# Patient Record
Sex: Female | Born: 1950 | Race: White | Hispanic: No | Marital: Married | Smoking: Former smoker
Health system: Southern US, Community
[De-identification: ages and names within clinical notes are randomized; demographics above are authoritative.]

## PROBLEM LIST (undated history)

## (undated) DIAGNOSIS — Z973 Presence of spectacles and contact lenses: Secondary | ICD-10-CM

## (undated) DIAGNOSIS — M1990 Unspecified osteoarthritis, unspecified site: Secondary | ICD-10-CM

## (undated) DIAGNOSIS — G43909 Migraine, unspecified, not intractable, without status migrainosus: Secondary | ICD-10-CM

## (undated) DIAGNOSIS — R102 Pelvic and perineal pain: Secondary | ICD-10-CM

## (undated) DIAGNOSIS — Z808 Family history of malignant neoplasm of other organs or systems: Secondary | ICD-10-CM

## (undated) DIAGNOSIS — Z9889 Other specified postprocedural states: Secondary | ICD-10-CM

## (undated) DIAGNOSIS — R112 Nausea with vomiting, unspecified: Secondary | ICD-10-CM

## (undated) DIAGNOSIS — N736 Female pelvic peritoneal adhesions (postinfective): Secondary | ICD-10-CM

## (undated) DIAGNOSIS — E039 Hypothyroidism, unspecified: Secondary | ICD-10-CM

## (undated) DIAGNOSIS — Z8719 Personal history of other diseases of the digestive system: Secondary | ICD-10-CM

## (undated) DIAGNOSIS — C50912 Malignant neoplasm of unspecified site of left female breast: Secondary | ICD-10-CM

## (undated) DIAGNOSIS — Z803 Family history of malignant neoplasm of breast: Secondary | ICD-10-CM

## (undated) HISTORY — DX: Hypothyroidism, unspecified: E03.9

## (undated) HISTORY — DX: Family history of malignant neoplasm of other organs or systems: Z80.8

## (undated) HISTORY — PX: OTHER SURGICAL HISTORY: SHX169

## (undated) HISTORY — DX: Malignant neoplasm of unspecified site of left female breast: C50.912

## (undated) HISTORY — DX: Family history of malignant neoplasm of breast: Z80.3

---
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
## 1997-11-05 ENCOUNTER — Other Ambulatory Visit: Admission: RE | Admit: 1997-11-05 | Discharge: 1997-11-05 | Payer: Self-pay | Admitting: *Deleted

## 1998-11-10 ENCOUNTER — Other Ambulatory Visit: Admission: RE | Admit: 1998-11-10 | Discharge: 1998-11-10 | Payer: Self-pay | Admitting: *Deleted

## 2000-01-06 ENCOUNTER — Other Ambulatory Visit: Admission: RE | Admit: 2000-01-06 | Discharge: 2000-01-06 | Payer: Self-pay | Admitting: *Deleted

## 2001-01-09 ENCOUNTER — Other Ambulatory Visit: Admission: RE | Admit: 2001-01-09 | Discharge: 2001-01-09 | Payer: Self-pay | Admitting: *Deleted

## 2001-08-15 HISTORY — PX: LAPAROSCOPIC OVARIAN CYSTECTOMY: SUR786

## 2002-02-25 ENCOUNTER — Other Ambulatory Visit: Admission: RE | Admit: 2002-02-25 | Discharge: 2002-02-25 | Payer: Self-pay | Admitting: *Deleted

## 2002-08-15 HISTORY — PX: BREAST SURGERY: SHX581

## 2003-03-03 ENCOUNTER — Other Ambulatory Visit: Admission: RE | Admit: 2003-03-03 | Discharge: 2003-03-03 | Payer: Self-pay | Admitting: *Deleted

## 2003-06-23 ENCOUNTER — Other Ambulatory Visit: Admission: RE | Admit: 2003-06-23 | Discharge: 2003-06-23 | Payer: Self-pay | Admitting: General Surgery

## 2004-03-22 ENCOUNTER — Other Ambulatory Visit: Admission: RE | Admit: 2004-03-22 | Discharge: 2004-03-22 | Payer: Self-pay | Admitting: *Deleted

## 2004-08-15 HISTORY — PX: ABDOMINAL HYSTERECTOMY: SHX81

## 2005-03-23 ENCOUNTER — Other Ambulatory Visit: Admission: RE | Admit: 2005-03-23 | Discharge: 2005-03-23 | Payer: Self-pay | Admitting: *Deleted

## 2006-06-26 ENCOUNTER — Other Ambulatory Visit: Admission: RE | Admit: 2006-06-26 | Discharge: 2006-06-26 | Payer: Self-pay | Admitting: *Deleted

## 2007-06-26 ENCOUNTER — Other Ambulatory Visit: Admission: RE | Admit: 2007-06-26 | Discharge: 2007-06-26 | Payer: Self-pay | Admitting: *Deleted

## 2008-06-26 ENCOUNTER — Other Ambulatory Visit: Admission: RE | Admit: 2008-06-26 | Discharge: 2008-06-26 | Payer: Self-pay | Admitting: Gynecology

## 2010-08-15 HISTORY — PX: COLONOSCOPY: SHX174

## 2011-05-25 ENCOUNTER — Other Ambulatory Visit: Payer: Self-pay | Admitting: Radiology

## 2011-05-26 ENCOUNTER — Other Ambulatory Visit: Payer: Self-pay | Admitting: Radiology

## 2011-05-26 DIAGNOSIS — C50912 Malignant neoplasm of unspecified site of left female breast: Secondary | ICD-10-CM

## 2011-05-30 ENCOUNTER — Ambulatory Visit
Admission: RE | Admit: 2011-05-30 | Discharge: 2011-05-30 | Disposition: A | Discharge status: Home / Self Care | Payer: Commercial Managed Care - PPO | Source: Ambulatory Visit | Attending: Radiology | Admitting: Radiology

## 2011-05-30 DIAGNOSIS — C50912 Malignant neoplasm of unspecified site of left female breast: Secondary | ICD-10-CM

## 2011-05-30 MED ORDER — GADOBENATE DIMEGLUMINE 529 MG/ML IV SOLN
9.0000 mL | Freq: Once | INTRAVENOUS | Status: AC | PRN
  Administered 2011-05-30: 9 mL via INTRAVENOUS

## 2011-06-01 ENCOUNTER — Ambulatory Visit: Payer: Commercial Managed Care - PPO | Attending: General Surgery | Admitting: Physical Therapy

## 2011-06-01 ENCOUNTER — Other Ambulatory Visit: Payer: Self-pay | Admitting: Oncology

## 2011-06-01 ENCOUNTER — Encounter (INDEPENDENT_AMBULATORY_CARE_PROVIDER_SITE_OTHER): Payer: Self-pay | Admitting: General Surgery

## 2011-06-01 ENCOUNTER — Ambulatory Visit (HOSPITAL_BASED_OUTPATIENT_CLINIC_OR_DEPARTMENT_OTHER): Payer: Self-pay | Admitting: General Surgery

## 2011-06-01 ENCOUNTER — Encounter (HOSPITAL_BASED_OUTPATIENT_CLINIC_OR_DEPARTMENT_OTHER): Payer: Commercial Managed Care - PPO | Admitting: Oncology

## 2011-06-01 VITALS — BP 123/73 | HR 66 | Temp 97.4°F | Resp 20 | Ht 62.5 in | Wt 106.7 lb

## 2011-06-01 DIAGNOSIS — IMO0001 Reserved for inherently not codable concepts without codable children: Secondary | ICD-10-CM | POA: Insufficient documentation

## 2011-06-01 DIAGNOSIS — C50919 Malignant neoplasm of unspecified site of unspecified female breast: Secondary | ICD-10-CM | POA: Insufficient documentation

## 2011-06-01 DIAGNOSIS — C50119 Malignant neoplasm of central portion of unspecified female breast: Secondary | ICD-10-CM

## 2011-06-01 DIAGNOSIS — C50912 Malignant neoplasm of unspecified site of left female breast: Secondary | ICD-10-CM | POA: Insufficient documentation

## 2011-06-01 DIAGNOSIS — M25619 Stiffness of unspecified shoulder, not elsewhere classified: Secondary | ICD-10-CM | POA: Insufficient documentation

## 2011-06-01 DIAGNOSIS — C50419 Malignant neoplasm of upper-outer quadrant of unspecified female breast: Secondary | ICD-10-CM

## 2011-06-01 LAB — CBC WITH DIFFERENTIAL/PLATELET
Basophils Absolute: 0 10*3/uL (ref 0.0–0.1)
EOS%: 0.9 % (ref 0.0–7.0)
HCT: 40.5 % (ref 34.8–46.6)
HGB: 13.7 g/dL (ref 11.6–15.9)
LYMPH%: 28.2 % (ref 14.0–49.7)
MCH: 31.2 pg (ref 25.1–34.0)
MCV: 92.6 fL (ref 79.5–101.0)
NEUT%: 61.8 % (ref 38.4–76.8)
Platelets: 207 10*3/uL (ref 145–400)
lymph#: 1.3 10*3/uL (ref 0.9–3.3)

## 2011-06-01 LAB — COMPREHENSIVE METABOLIC PANEL
ALT: 15 U/L (ref 0–35)
AST: 16 U/L (ref 0–37)
Albumin: 4.4 g/dL (ref 3.5–5.2)
CO2: 29 mEq/L (ref 19–32)
Calcium: 10.1 mg/dL (ref 8.4–10.5)
Chloride: 103 mEq/L (ref 96–112)
Creatinine, Ser: 0.59 mg/dL (ref 0.50–1.10)
Potassium: 3.4 mEq/L — ABNORMAL LOW (ref 3.5–5.3)
Total Protein: 7 g/dL (ref 6.0–8.3)

## 2011-06-01 LAB — CANCER ANTIGEN 27.29: CA 27.29: 15 U/mL (ref 0–39)

## 2011-06-01 NOTE — Patient Instructions (Signed)
Plan for bilateral mastectomy and left sentinel node mapping 

## 2011-06-01 NOTE — Progress Notes (Signed)
Subjective:     Patient ID: Beth Davis, female   DOB: November 18, 1950, 60 y.o.   MRN: [id number]  HPI We are asked to see the patient in consultation by Dr. Anselmo Pickler to evaluate her for a left breast cancer. The patient is a 60 year old white female who recently went for mammogram andBSGI study. She has very dense breast tissue and as these studies are routine basis. She does not have any breast pain or discharge or nipple. TheBSGI study located a mass in the upper left breast. 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 By MRI the mass measured 8 mm. It was ER positive PR negative HER-2 negative. Her Ki-67 was 25%. She does have a family history of breast cancer in her mother also at the age of 72.  Review of Systems  Constitutional: Negative.   HENT: Negative.   Eyes: Negative.   Respiratory: Negative.   Cardiovascular: Negative.   Gastrointestinal: Negative.   Genitourinary: Negative.   Musculoskeletal: Negative.   Skin: Negative.   Neurological: Negative.   Hematological: Negative.   Psychiatric/Behavioral: Negative.    Past Medical History  Diagnosis Date  . Hypothyroidism   . Collagenous colitis   . Carpal tunnel syndrome of right wrist    Past Surgical History  Procedure Date  . Abdominal hysterectomy   . Laparoscopy abdomen diagnostic   . Orif femur fracture   . Orif humerus decompression   . Breast surgery 2004    excision left breast fibroadenoma   Current Outpatient Prescriptions  Medication Sig Dispense Refill  . budesonide (ENTOCORT EC) 3 MG 24 hr capsule Take 6 mg by mouth every morning.        . cholecalciferol (VITAMIN D) 1000 UNITS tablet Take 1,000 Units by mouth daily.        . frovatriptan (FROVA) 2.5 MG tablet Take 2.5 mg by mouth as needed. If recurs, may repeat after 2 hours. Max of 3 tabs in 24 hours.       Marland Kitchen levothyroxine (SYNTHROID, LEVOTHROID) 75 MCG tablet Take 75 mcg by mouth daily.        Marland Kitchen liothyronine (CYTOMEL) 5 MCG  tablet Take 5 mcg by mouth daily.         Allergies  Allergen Reactions  . Erythromycin Nausea Only  . Penicillins Hives       Objective:   Physical Exam  Constitutional: She is oriented to person, place, and time. She appears well-developed and well-nourished.  HENT:  Head: Normocephalic and atraumatic.  Eyes: Conjunctivae and EOM are normal. Pupils are equal, round, and reactive to light.  Neck: Normal range of motion. Neck supple.  Cardiovascular: Normal rate, regular rhythm and normal heart sounds.   Pulmonary/Chest: Effort normal and breath sounds normal.       She has no palpable mass in either breast. No axillary supraclavicular or cervical lymphadenopathy on either side.  Abdominal: Soft. Bowel sounds are normal.  Musculoskeletal: Normal range of motion.  Neurological: She is alert and oriented to person, place, and time.  Skin: Skin is warm and dry.  Psychiatric: She has a normal mood and affect. Her behavior is normal.       Assessment:     Small breast cancer in the upper left breast.    Plan:     I have discussed with her in detail the different surgical options for treatment and she has elected to have bilateral mastectomies. She understands that removing the unaffected breast  has no survival benefit. I have discussed with her in detail the risks and benefits operation and removed both breasts and perform sentinel node mapping and she understands and wishes to proceed. This includes the risk of skin flap necrosis. She will also have genetic testing done due to her family history.

## 2011-06-02 ENCOUNTER — Other Ambulatory Visit (INDEPENDENT_AMBULATORY_CARE_PROVIDER_SITE_OTHER): Payer: Self-pay | Admitting: General Surgery

## 2011-06-02 DIAGNOSIS — C50912 Malignant neoplasm of unspecified site of left female breast: Secondary | ICD-10-CM

## 2011-06-03 ENCOUNTER — Encounter: Payer: Commercial Managed Care - PPO | Admitting: Oncology

## 2011-06-07 ENCOUNTER — Encounter (HOSPITAL_COMMUNITY)
Admission: RE | Admit: 2011-06-07 | Discharge: 2011-06-07 | Disposition: A | Discharge status: Home / Self Care | Payer: Commercial Managed Care - PPO | Source: Ambulatory Visit | Attending: General Surgery | Admitting: General Surgery

## 2011-06-08 ENCOUNTER — Ambulatory Visit (INDEPENDENT_AMBULATORY_CARE_PROVIDER_SITE_OTHER): Payer: 59 | Admitting: Surgery

## 2011-06-08 ENCOUNTER — Encounter (INDEPENDENT_AMBULATORY_CARE_PROVIDER_SITE_OTHER): Payer: Self-pay | Admitting: Surgery

## 2011-06-08 VITALS — BP 118/78 | HR 80 | Temp 98.7°F | Resp 12 | Ht 62.5 in | Wt 105.8 lb

## 2011-06-08 DIAGNOSIS — C50919 Malignant neoplasm of unspecified site of unspecified female breast: Secondary | ICD-10-CM

## 2011-06-08 DIAGNOSIS — C50912 Malignant neoplasm of unspecified site of left female breast: Secondary | ICD-10-CM

## 2011-06-08 NOTE — Patient Instructions (Signed)
We will see you again at surgery. Call if you have any questions

## 2011-06-08 NOTE — Progress Notes (Signed)
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 She was returned seen by my associate, Dr. Carolynne Edouard, and was scheduled for bilateral mastectomies with left sentinel node evaluation. Unfortunately he has a family emergency and will be unable to do the surgery so she came to speak with need to be scheduled for surgery for me to do.  Details of her current history are noted in his note and not redictated here. 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  Exam:  Gen.: The patient is alert or healthy-appearing. Mood and affect are normal. Breasts: The breasts are symmetric in appearance. The biopsy site appears to be laterally in the left breast. There is no significant hematoma or ecchymosis. There is no palpable mass.  Lymphatics: There is no axillary or supraclavicular adenopathy.  Data reviewed: I have reviewed the prior office notes, pathology reports, mammogram and MRI and pathology findings.  Impression: Clinical stage I left breast cancer central.  Plan: She would like to have bilateral mastectomies. This was already discussed with Dr.Toth. She understands that a prophylactic mastectomy on the right side does not improve her chances for cure of recurrent cancer. She also understands that lumpectomy is a viable option. However she is insistent that she would prefer to have bilateral mastectomies. She does not wish reconstruction.  I discussed surgery with her including risks of bleeding need for drains knee for lesion overnight stay et Karie Soda. I also told her that if her sentinel lobes are positive we may need to do a completion axillary node dissection.  I think all questions have been answered. She is scheduled for October 30 four bilateral [facility].

## 2011-06-09 ENCOUNTER — Other Ambulatory Visit (INDEPENDENT_AMBULATORY_CARE_PROVIDER_SITE_OTHER): Payer: Self-pay | Admitting: Surgery

## 2011-06-09 ENCOUNTER — Encounter (HOSPITAL_BASED_OUTPATIENT_CLINIC_OR_DEPARTMENT_OTHER)
Admission: RE | Admit: 2011-06-09 | Discharge: 2011-06-09 | Disposition: A | Discharge status: Home / Self Care | Payer: Commercial Managed Care - PPO | Source: Ambulatory Visit | Attending: Surgery | Admitting: Surgery

## 2011-06-09 ENCOUNTER — Ambulatory Visit
Admission: RE | Admit: 2011-06-09 | Discharge: 2011-06-09 | Disposition: A | Discharge status: Home / Self Care | Payer: 59 | Source: Ambulatory Visit | Attending: Surgery | Admitting: Surgery

## 2011-06-09 DIAGNOSIS — Z01811 Encounter for preprocedural respiratory examination: Secondary | ICD-10-CM

## 2011-06-09 LAB — BASIC METABOLIC PANEL
BUN: 7 mg/dL (ref 6–23)
Calcium: 10.1 mg/dL (ref 8.4–10.5)
Chloride: 105 mEq/L (ref 96–112)
Creatinine, Ser: 0.58 mg/dL (ref 0.50–1.10)
GFR calc Af Amer: >90 mL/min (ref >90)
GFR calc non Af Amer: >90 mL/min (ref >90)

## 2011-06-10 ENCOUNTER — Other Ambulatory Visit (HOSPITAL_COMMUNITY): Payer: Commercial Managed Care - PPO

## 2011-06-14 ENCOUNTER — Other Ambulatory Visit (HOSPITAL_COMMUNITY): Payer: Commercial Managed Care - PPO

## 2011-06-14 ENCOUNTER — Ambulatory Visit (HOSPITAL_COMMUNITY)
Admission: RE | Admit: 2011-06-14 | Discharge: 2011-06-14 | Disposition: A | Discharge status: Home / Self Care | Payer: Commercial Managed Care - PPO | Source: Ambulatory Visit | Attending: General Surgery | Admitting: General Surgery

## 2011-06-14 ENCOUNTER — Encounter (INDEPENDENT_AMBULATORY_CARE_PROVIDER_SITE_OTHER): Payer: Self-pay | Admitting: Surgery

## 2011-06-14 ENCOUNTER — Ambulatory Visit (HOSPITAL_BASED_OUTPATIENT_CLINIC_OR_DEPARTMENT_OTHER)
Admission: RE | Admit: 2011-06-14 | Discharge: 2011-06-15 | Disposition: A | Discharge status: Home / Self Care | Payer: Commercial Managed Care - PPO | Source: Ambulatory Visit | Attending: Surgery | Admitting: Surgery

## 2011-06-14 ENCOUNTER — Other Ambulatory Visit (INDEPENDENT_AMBULATORY_CARE_PROVIDER_SITE_OTHER): Payer: Self-pay | Admitting: Surgery

## 2011-06-14 DIAGNOSIS — C50912 Malignant neoplasm of unspecified site of left female breast: Secondary | ICD-10-CM

## 2011-06-14 DIAGNOSIS — C50419 Malignant neoplasm of upper-outer quadrant of unspecified female breast: Secondary | ICD-10-CM | POA: Insufficient documentation

## 2011-06-14 DIAGNOSIS — Z01818 Encounter for other preprocedural examination: Secondary | ICD-10-CM | POA: Insufficient documentation

## 2011-06-14 DIAGNOSIS — G43909 Migraine, unspecified, not intractable, without status migrainosus: Secondary | ICD-10-CM | POA: Insufficient documentation

## 2011-06-14 DIAGNOSIS — E079 Disorder of thyroid, unspecified: Secondary | ICD-10-CM | POA: Insufficient documentation

## 2011-06-14 DIAGNOSIS — N6019 Diffuse cystic mastopathy of unspecified breast: Secondary | ICD-10-CM

## 2011-06-14 DIAGNOSIS — C50919 Malignant neoplasm of unspecified site of unspecified female breast: Secondary | ICD-10-CM

## 2011-06-14 DIAGNOSIS — Z4001 Encounter for prophylactic removal of breast: Secondary | ICD-10-CM | POA: Insufficient documentation

## 2011-06-14 DIAGNOSIS — Z01812 Encounter for preprocedural laboratory examination: Secondary | ICD-10-CM | POA: Insufficient documentation

## 2011-06-14 DIAGNOSIS — D0590 Unspecified type of carcinoma in situ of unspecified breast: Secondary | ICD-10-CM | POA: Insufficient documentation

## 2011-06-14 DIAGNOSIS — Z01810 Encounter for preprocedural cardiovascular examination: Secondary | ICD-10-CM | POA: Insufficient documentation

## 2011-06-14 HISTORY — PX: OTHER SURGICAL HISTORY: SHX169

## 2011-06-14 MED ORDER — TECHNETIUM TC 99M SULFUR COLLOID FILTERED
1.0000 | Freq: Once | INTRAVENOUS | Status: AC | PRN
  Administered 2011-06-14: 1 via INTRADERMAL

## 2011-06-15 LAB — POCT HEMOGLOBIN-HEMACUE: Hemoglobin: 14.1 g/dL (ref 12.0–15.0)

## 2011-06-15 NOTE — Op Note (Signed)
NAME:  Beth Davis, Beth Davis NO.:  [id number]  MEDICAL RECORD NO.:  [id number]  LOCATION:  [facility]                          FACILITY:  [facility]  PHYSICIAN:  Currie Paris, M.D.DATE OF BIRTH:  04-10-1951  DATE OF PROCEDURE: DATE OF DISCHARGE:                              OPERATIVE REPORT   PREOPERATIVE DIAGNOSIS:  Carcinoma, left breast upper outer quadrant, clinical stage I.  POSTOPERATIVE DIAGNOSIS:  Carcinoma, left breast upper outer quadrant, clinical stage I.  PROCEDURE:  Left total mastectomy with blue dye injection and sentinel lymph node biopsy (4 lymph nodes), right prophylactic total mastectomy.  SURGEON:  Currie Paris, MD  ASSISTANT:  Jaclynn Major, PA student.  ANESTHESIA:  General.  CLINICAL HISTORY:  This patient is a 60 year old lady, recently diagnosed with a small left breast cancer.  She has a family history of breast cancer in mother.  After lengthy discussion with the patient, sheelected to proceed to a mastectomy with sentinel node evaluation of her breast cancer and she strongly desired a right prophylactic mastectomy. She did not wish any reconstruction at this point in time, although was offered for her to have a Plastic Surgery consultation.  DESCRIPTION OF PROCEDURE:  I saw the patient in the holding area.  She had no further questions.  I identified the left breast as the side for the sentinel node and marked that as did the patient.  The patient was taken to the operating room.  After satisfactory general anesthesia had been obtained, a time-out was done.  The left breast was injected with 5 mL of subareolar methylene blue and massaged in.  Both breasts were prepped and draped as a sterile field.  I started on the right side, made somewhat oblique elliptical incision and raised the usual skin flaps to sternum, clavicle inframammary fold, and latissimus and removed the breast from medial to lateral trying to leave the fascia  behind.  I disconnected the breast at the lateral aspect of the pectoralis and took the superficial tissue between there and the skin, but did not enter the axilla proper.  I spent several minutes irrigating and making sure everything was dry.  I put a 19 Blake drain in.  I closed initially with 3-0 Vicryl, but left the skin unclosed for while, I approached the left side.  The left breast was started and I marked the axilla for hot area that was identified with the Neoprobe.  I then made the superior skin flap raised to the clavicle, sternum, and out into the axilla.  I saw right 3 lymphatic and there was a cluster what appeared to be 3-4 lymph nodes and all had counts from 300 up to 3000.  The 4 were removed, 2 of them were bright blue.  These were sent for touch preps.  I made sure everything was dry.  I then made the inferior flap somewhat to the right side.  I removed the breast taking the fascia.  When I got to the clavipectoral fascia, I waited for a while for pathology to call back with the final path report on the sentinel nodes.  Meantime, the right breast skin was closed with some 4-0 Monocryl running and Dermabond. There had been no  bleeding collecting in the drain while we had been working on the left side.  Pathology called that the 4 lymph nodes were all negative.  I completed the mastectomy by removing the lateral attachments of the breast.  We spent several minutes irrigating making sure everything was dry.  Here bleeders were either coagulated or suture ligated or tied with Vicryl. Once everything was dry, we closed with 3-0 Vicryl, 4-0 Monocryl running, Dermabond, and Steri-Strips.  The patient tolerated the procedure well.  There were no complications. All counts were correct.     Currie Paris, M.D.     CJS/MEDQ  D:  06/14/2011  T:  06/14/2011  Job:  [id number]  Electronically Signed by Cyndia Bent M.D. on 06/15/2011 11:45:48 AM

## 2011-06-16 ENCOUNTER — Telehealth (INDEPENDENT_AMBULATORY_CARE_PROVIDER_SITE_OTHER): Payer: Self-pay | Admitting: General Surgery

## 2011-06-16 NOTE — Telephone Encounter (Signed)
Patient called in to ask about arm exercises status post bilateral mastectomies. I informed her she should not start exercises until her drains are removed. She asked about elevating her arms and I stating this wasn't necessary but to call if has any swelling, redness, or any other post op problems.

## 2011-06-17 ENCOUNTER — Telehealth (INDEPENDENT_AMBULATORY_CARE_PROVIDER_SITE_OTHER): Payer: Self-pay

## 2011-06-17 ENCOUNTER — Telehealth (INDEPENDENT_AMBULATORY_CARE_PROVIDER_SITE_OTHER): Payer: Self-pay | Admitting: General Surgery

## 2011-06-17 NOTE — Telephone Encounter (Signed)
Pt called stating she was having slight nausea with pain meds. No vomiting and minimal discomfort. Pt states she does not really need the pain med. Pt to d/c pain med and take one dose of benadryl to see if this resolves nausea. Pt to call if nausea continues or other symptoms occur.

## 2011-06-17 NOTE — Telephone Encounter (Signed)
Message copied by Liliana Cline on Fri Jun 17, 2011  1:39 PM ------      Message from: Currie Paris      Created: Thu Jun 16, 2011  7:02 PM       Tell the patient that her margins are OK and her lymph nodes are negative. I will discuss in detail in the office.

## 2011-06-17 NOTE — Telephone Encounter (Signed)
Patient aware of pathology results.

## 2011-06-20 ENCOUNTER — Ambulatory Visit (HOSPITAL_COMMUNITY)
Admission: RE | Admit: 2011-06-20 | Payer: Commercial Managed Care - PPO | Source: Ambulatory Visit | Admitting: General Surgery

## 2011-06-20 ENCOUNTER — Ambulatory Visit (INDEPENDENT_AMBULATORY_CARE_PROVIDER_SITE_OTHER): Payer: Commercial Managed Care - PPO | Admitting: Surgery

## 2011-06-20 ENCOUNTER — Encounter (INDEPENDENT_AMBULATORY_CARE_PROVIDER_SITE_OTHER): Payer: Self-pay | Admitting: Surgery

## 2011-06-20 ENCOUNTER — Other Ambulatory Visit (HOSPITAL_COMMUNITY): Payer: Commercial Managed Care - PPO

## 2011-06-20 ENCOUNTER — Encounter (HOSPITAL_COMMUNITY): Admission: RE | Payer: Self-pay | Source: Ambulatory Visit

## 2011-06-20 VITALS — Temp 98.4°F

## 2011-06-20 DIAGNOSIS — C50912 Malignant neoplasm of unspecified site of left female breast: Secondary | ICD-10-CM

## 2011-06-20 DIAGNOSIS — C50919 Malignant neoplasm of unspecified site of unspecified female breast: Secondary | ICD-10-CM

## 2011-06-20 SURGERY — MASTECTOMY WITH SENTINEL LYMPH NODE BIOPSY
Anesthesia: General | Laterality: Bilateral

## 2011-06-20 NOTE — Progress Notes (Signed)
Beth Davis    [id number] 06/20/2011    [birth year]   CC: Post op Bilateral mastectomy   HPI: The patient returns for post op follow-up. She underwent a right total and left total with SLN on 06/14/2011. Over all she feels that she is doing well. Minimal pain and gaining strength  PE: The incision is healing nicely and there is no evidence of infection or hematoma.  The drains are slowing and the right is  , 30 cc/day for two days.  DATA REVIEWED: Pathology report showed IDC, neg SLN  IMPRESSION: Patient doing well. Can pull Right drain  PLAN: Her next visit will be in four days. Right drain removed without difficulty.Marland Kitchen

## 2011-06-20 NOTE — Patient Instructions (Signed)
I will see you back Friday to see if we can get the second drain out.

## 2011-06-22 ENCOUNTER — Ambulatory Visit (INDEPENDENT_AMBULATORY_CARE_PROVIDER_SITE_OTHER): Payer: Commercial Managed Care - PPO | Admitting: General Surgery

## 2011-06-22 DIAGNOSIS — IMO0001 Reserved for inherently not codable concepts without codable children: Secondary | ICD-10-CM

## 2011-06-22 DIAGNOSIS — Z4800 Encounter for change or removal of nonsurgical wound dressing: Secondary | ICD-10-CM

## 2011-06-22 NOTE — Progress Notes (Signed)
Patient comes in status post bilateral mastectomies with a drain still present on her left side. Drain has been under 30 cc for two days and patient is ready to have this removed. Her only complaint is of some tightness at her incisions. She states very uncomfortable. Her wounds look good. Drainage in drain looks good. I removed drain with no problems and covered drain site with gauze and triple antibiotic ointment. Patient advised to keep covered for 24 hours and then just cover area if still having drainage. Patient advised she can shower but not to submerge wounds in a bath yet. She will follow up with Dr Jamey Ripa on Friday 06/24/11 and call with any problems prior.

## 2011-06-22 NOTE — Patient Instructions (Signed)
Keep your appt with Dr Jamey Ripa. Call if you have any fevers, redness, swelling, prior to appt.

## 2011-06-24 ENCOUNTER — Ambulatory Visit (INDEPENDENT_AMBULATORY_CARE_PROVIDER_SITE_OTHER): Payer: Commercial Managed Care - PPO | Admitting: Surgery

## 2011-06-24 ENCOUNTER — Encounter (INDEPENDENT_AMBULATORY_CARE_PROVIDER_SITE_OTHER): Payer: Self-pay | Admitting: Surgery

## 2011-06-24 VITALS — BP 122/72 | HR 60 | Temp 98.3°F | Resp 16 | Ht 62.5 in | Wt 103.0 lb

## 2011-06-24 DIAGNOSIS — Z853 Personal history of malignant neoplasm of breast: Secondary | ICD-10-CM

## 2011-06-24 DIAGNOSIS — Z09 Encounter for follow-up examination after completed treatment for conditions other than malignant neoplasm: Secondary | ICD-10-CM

## 2011-06-24 NOTE — Progress Notes (Signed)
Beth Davis    [id number] 06/24/2011    1951/03/24   CC: Post op Bilateral mastectomy   HPI: The patient returns for post op follow-up. She underwent a right total and left total with SLN on 06/14/2011. Over all she feels that she is doing well. Minimal pain and gaining strength  PE: The incision is healing nicely and there is no evidence of infection or hematoma.  The drains are out .    IMPRESSION: Patient doing well.  PLAN: RTC one month. Begin PT for shoulders/arms .Marland Kitchen

## 2011-06-24 NOTE — Patient Instructions (Signed)
Begin some physical therapy exercises. I will see you in a month, sooner if you think there is any fluid collecting

## 2011-06-27 ENCOUNTER — Encounter: Payer: Self-pay | Admitting: *Deleted

## 2011-06-27 ENCOUNTER — Ambulatory Visit: Payer: Commercial Managed Care - PPO | Attending: General Surgery | Admitting: Physical Therapy

## 2011-06-27 DIAGNOSIS — C50919 Malignant neoplasm of unspecified site of unspecified female breast: Secondary | ICD-10-CM | POA: Insufficient documentation

## 2011-06-27 DIAGNOSIS — M25619 Stiffness of unspecified shoulder, not elsewhere classified: Secondary | ICD-10-CM | POA: Insufficient documentation

## 2011-06-27 DIAGNOSIS — IMO0001 Reserved for inherently not codable concepts without codable children: Secondary | ICD-10-CM | POA: Insufficient documentation

## 2011-06-27 NOTE — Progress Notes (Signed)
Mailed after appt letter to pt. 

## 2011-06-29 ENCOUNTER — Ambulatory Visit: Payer: Commercial Managed Care - PPO | Admitting: Physical Therapy

## 2011-07-04 ENCOUNTER — Encounter: Payer: Commercial Managed Care - PPO | Admitting: Physical Therapy

## 2011-07-05 ENCOUNTER — Ambulatory Visit: Payer: Commercial Managed Care - PPO | Admitting: Physical Therapy

## 2011-07-05 ENCOUNTER — Encounter: Payer: Self-pay | Admitting: *Deleted

## 2011-07-05 NOTE — Progress Notes (Signed)
Received Oncotype Dx results of 29.  Gave copy to MD.

## 2011-07-06 ENCOUNTER — Telehealth (INDEPENDENT_AMBULATORY_CARE_PROVIDER_SITE_OTHER): Payer: Self-pay | Admitting: General Surgery

## 2011-07-06 NOTE — Telephone Encounter (Signed)
Patient left voicemail stating she is doing well accept having some "weird nerve pains." Stating she "can't stand for anything to touch her" and compression is not helping. She is calling to see if we ever prescribe gabapentin for this. I called her back. Made her aware we do not usually do this because it usually gets better on its own but made her aware I would ask Dr Jamey Ripa. Please advise.

## 2011-07-11 ENCOUNTER — Ambulatory Visit: Payer: Commercial Managed Care - PPO | Admitting: Physical Therapy

## 2011-07-11 NOTE — Telephone Encounter (Signed)
Patient called back today stating her nerve pain has gotten better over the weekend and she does not need a medication to help with that. Also mentioned that she spoke with [facility] through physical therapy and wants to start therapy to help with her adhesions. They will send a fax over for Dr Jamey Ripa to sign.

## 2011-07-13 ENCOUNTER — Encounter: Payer: Commercial Managed Care - PPO | Admitting: Physical Therapy

## 2011-07-18 ENCOUNTER — Encounter: Payer: Commercial Managed Care - PPO | Admitting: Physical Therapy

## 2011-07-18 ENCOUNTER — Telehealth: Payer: Self-pay | Admitting: *Deleted

## 2011-07-18 ENCOUNTER — Telehealth: Payer: Self-pay | Admitting: Oncology

## 2011-07-18 ENCOUNTER — Telehealth (INDEPENDENT_AMBULATORY_CARE_PROVIDER_SITE_OTHER): Payer: Self-pay | Admitting: Surgery

## 2011-07-18 ENCOUNTER — Ambulatory Visit (HOSPITAL_BASED_OUTPATIENT_CLINIC_OR_DEPARTMENT_OTHER): Payer: Commercial Managed Care - PPO | Admitting: Oncology

## 2011-07-18 VITALS — BP 122/78 | HR 76 | Temp 98.5°F | Ht 62.5 in | Wt 105.3 lb

## 2011-07-18 DIAGNOSIS — Z170 Estrogen receptor positive status [ER+]: Secondary | ICD-10-CM

## 2011-07-18 DIAGNOSIS — C50919 Malignant neoplasm of unspecified site of unspecified female breast: Secondary | ICD-10-CM

## 2011-07-18 DIAGNOSIS — Z9010 Acquired absence of unspecified breast and nipple: Secondary | ICD-10-CM

## 2011-07-18 DIAGNOSIS — C50419 Malignant neoplasm of upper-outer quadrant of unspecified female breast: Secondary | ICD-10-CM

## 2011-07-18 NOTE — Progress Notes (Signed)
ID: [name]   Interval History:  Beth Davis returns today with her husband Beth Davis for followup of her breast cancer. Since her last visit here she underwent bilateral mastectomies with left axillary lymph node sampling. The final pathology ([id number]) showed the right mastectomy to be benign, with 0 of 4 lymph nodes sampled. On the left side in addition to ductal carcinoma in situ, there was an 8 mm invasive ductal carcinoma, grade 2, as well as other smaller foci of invasive tumor in different quadrants. All 7 left axillary lymph nodes were clear. ATR 2 was repeated and again was found to be not amplified.  ROS:  She tolerated the surgery well, without unusual pain, bleeding, dehiscence, rash, or swelling. Detailed review of systems today was otherwise noncontributory.  Medications: I have reviewed the patient's current medications.  Current Outpatient Prescriptions  Medication Sig Dispense Refill  . budesonide (ENTOCORT EC) 3 MG 24 hr capsule Take 6 mg by mouth every morning.        . cholecalciferol (VITAMIN D) 1000 UNITS tablet Take 1,000 Units by mouth daily.        . frovatriptan (FROVA) 2.5 MG tablet Take 2.5 mg by mouth as needed. If recurs, may repeat after 2 hours. Max of 3 tabs in 24 hours.       Marland Kitchen levothyroxine (SYNTHROID, LEVOTHROID) 125 MCG tablet Take 125 mcg by mouth daily. Takes half of the pill so only 62.5mg        . liothyronine (CYTOMEL) 5 MCG tablet Take 5 mcg by mouth daily.         Family history: The patient's mother is alive at age 59. She had breast cancer diagnosed at the age of 51. The patient has 3 brothers no sisters. The patient's father died at the age of 41. A cousin on her father's side had breast cancer at the age of 81.  Gynecologic history: She had menarche age 60, simple hysterectomy in 2006. She is GX P2, first pregnancy to term age 60. She used birth control pills between 1972 and 1974. She took Clomid for fertility between 1983 and 1986.  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 Her husband Christiane Davis (goes by Beth Davis) is a Clinical research associate here in town. Daughter Beth Davis 26 lives in [place]. She works there as a Education officer, community. Dr. [name], 25, is a Gaffer and [place] in nutrition and diet headaches.   Objective:  Filed Vitals:   07/18/11 1021  BP: 122/78  Pulse: 76  Temp: 98.5 F (36.9 C)     Physical Exam:    Sclerae unicteric  Oropharynx clear  No peripheral adenopathy  Lungs clear -- no rales or rhonchi  Heart regular rate and rhythm  Abdomen benign  MSK no focal spinal tenderness, no peripheral edema  Neuro nonfocal  Breast exam: Status post bilateral mastectomies; in no dehiscence, swelling, erythema, or unusual tenderness.  Lab Results: baseline CA 27-29 was noninformative at 15  CMP      Component Value Date/Time   NA 142 06/09/2011 1240   K 5.1 06/09/2011 1240   CL 105 06/09/2011 1240   CO2 29 06/09/2011 1240   GLUCOSE 93 06/09/2011 1240   BUN 7 06/09/2011 1240   CREATININE 0.58 06/09/2011 1240   CALCIUM 10.1 06/09/2011 1240   PROT 7.0 06/01/2011 1218   PROT 7.0 06/01/2011 1218   ALBUMIN 4.4  06/01/2011 1218   ALBUMIN 4.4 06/01/2011 1218   AST 16 06/01/2011 1218   AST 16 06/01/2011 1218   ALT 15 06/01/2011 1218   ALT 15 06/01/2011 1218   ALKPHOS 95 06/01/2011 1218   ALKPHOS 95 06/01/2011 1218   BILITOT 0.4 06/01/2011 1218   BILITOT 0.4 06/01/2011 1218   GFRNONAA >90 06/09/2011 1240   GFRAA >90 06/09/2011 1240    CBC Lab Results  Component Value Date   WBC 4.8 06/01/2011   HGB 14.1 06/14/2011   HCT 40.5 06/01/2011   MCV 92.6 06/01/2011   PLT 207 06/01/2011    Studies/Results:  No new lts found.  Assessment: 60 year old [place] woman status post bilateral mastectomies October of 2012 for a left sided invasive ductal carcinoma, T1b N0 or Stage I, estrogen receptor 95%  positive, progesterone receptor negative, with no HER-2 amplification and an MIB-1 of59%. Her Oncotype DX falls in the intermediate range, and predicts a 19% risk of recurrence with 5 years of tamoxifen.   Plan: the Adjuvant! Program I would quote her a risk of death from this cancer with no further treatment and a 3% range, and a risk of recurrence after 5 years of anti-estrogens in the 9% range or so. This is of course significantly different from the Oncotype prognosis and we spent the better part of today's hour-long visit discussing this in detail.  Reckoning that chemotherapy cuts the risk of recurrence usually by about a third this means addition of chemotherapy would reduce her risk somewhere between 3 and 6%, absolute value, depending on which database we rely on/  I would favor ongoing with the Oncotype prognosis. The chief reason for this is the tumor grade, the fact that the tumor is progesterone receptor negative, and the moderately elevated MIB-1. This is not the usual low-grade estrogen and progesterone receptor negative tumor.  Accordingly I recommended chemotherapy and Beth Davis was very much in favor of this since she "wants to do everything possible" to keep this cancer from recurring. She will come to chemotherapy school in the near future, but today we discussed toxicities side effects and complications of treatment and fair detail. She is going to return to see me December 14. At that point we will discuss anti-emetics and other supportive care and I will write her the appropriate scripts. The plan will be to start cyclophosphamide and docetaxel December 17 and continue that on a every 3 week basis for a total of 4 cycles. Beth Davis C 07/18/2011

## 2011-07-18 NOTE — Telephone Encounter (Signed)
gave patient appointment for chemo class and gave patient appointment for 07-29-2011 at 10:00am called left voice message to inform dr.streck patient needs port a cath placed

## 2011-07-18 NOTE — Telephone Encounter (Signed)
pt called and aked if she has her first tx on 12/17.  appt was not scheduled,  scheduled pending tx orders for 12/17 and inj for 12/18

## 2011-07-19 ENCOUNTER — Other Ambulatory Visit (INDEPENDENT_AMBULATORY_CARE_PROVIDER_SITE_OTHER): Payer: Self-pay | Admitting: Surgery

## 2011-07-19 NOTE — Telephone Encounter (Signed)
Orders written and placed in EPIC. Taken to scheduling.

## 2011-07-20 ENCOUNTER — Encounter: Payer: Self-pay | Admitting: *Deleted

## 2011-07-20 ENCOUNTER — Other Ambulatory Visit: Payer: Commercial Managed Care - PPO

## 2011-07-20 ENCOUNTER — Encounter: Payer: Commercial Managed Care - PPO | Admitting: Physical Therapy

## 2011-07-21 ENCOUNTER — Encounter (HOSPITAL_BASED_OUTPATIENT_CLINIC_OR_DEPARTMENT_OTHER): Payer: Self-pay | Admitting: *Deleted

## 2011-07-21 NOTE — Progress Notes (Signed)
No iv-bp in lt arm No labs needed

## 2011-07-22 ENCOUNTER — Encounter (INDEPENDENT_AMBULATORY_CARE_PROVIDER_SITE_OTHER): Payer: Self-pay | Admitting: Surgery

## 2011-07-22 ENCOUNTER — Ambulatory Visit (INDEPENDENT_AMBULATORY_CARE_PROVIDER_SITE_OTHER): Payer: Commercial Managed Care - PPO | Admitting: Surgery

## 2011-07-22 VITALS — BP 124/82 | HR 60 | Temp 97.6°F | Resp 16 | Ht 62.5 in | Wt 103.0 lb

## 2011-07-22 DIAGNOSIS — Z9889 Other specified postprocedural states: Secondary | ICD-10-CM

## 2011-07-22 NOTE — Progress Notes (Signed)
Beth Davis    [id number] 07/22/2011    1951-03-23   CC: Post op Bilateral mastectomy   HPI: The patient returns for post op follow-up. She underwent a right total and left total with SLN on 06/14/2011. Over all she feels that she is doing well. Minimal pain and gaining strength She is going to do chemo and thus needs a port PE: The incision is healing nicely and there is no evidence of infection or hematoma.      IMPRESSION: Patient doing well.  PLAN: She is scheduled for a port placement on 12/11 ibuprofen have discussed risks and complications including bleeding, infection and ptx .Marland Kitchen

## 2011-07-22 NOTE — Patient Instructions (Signed)
I will see you Tuesday at surgery - call if any questions

## 2011-07-25 ENCOUNTER — Encounter: Payer: Commercial Managed Care - PPO | Admitting: Physical Therapy

## 2011-07-26 ENCOUNTER — Encounter (HOSPITAL_BASED_OUTPATIENT_CLINIC_OR_DEPARTMENT_OTHER)
Admission: RE | Disposition: A | Discharge status: Home / Self Care | Payer: Self-pay | Source: Ambulatory Visit | Attending: Surgery

## 2011-07-26 ENCOUNTER — Ambulatory Visit (HOSPITAL_BASED_OUTPATIENT_CLINIC_OR_DEPARTMENT_OTHER)
Admission: RE | Admit: 2011-07-26 | Discharge: 2011-07-26 | Disposition: A | Discharge status: Home / Self Care | Payer: Commercial Managed Care - PPO | Source: Ambulatory Visit | Attending: Surgery | Admitting: Surgery

## 2011-07-26 ENCOUNTER — Encounter (HOSPITAL_BASED_OUTPATIENT_CLINIC_OR_DEPARTMENT_OTHER): Payer: Self-pay | Admitting: *Deleted

## 2011-07-26 ENCOUNTER — Ambulatory Visit (HOSPITAL_COMMUNITY): Payer: Commercial Managed Care - PPO

## 2011-07-26 ENCOUNTER — Encounter (HOSPITAL_BASED_OUTPATIENT_CLINIC_OR_DEPARTMENT_OTHER): Payer: Self-pay | Admitting: Anesthesiology

## 2011-07-26 ENCOUNTER — Ambulatory Visit (HOSPITAL_BASED_OUTPATIENT_CLINIC_OR_DEPARTMENT_OTHER): Payer: Commercial Managed Care - PPO | Admitting: Anesthesiology

## 2011-07-26 DIAGNOSIS — C50912 Malignant neoplasm of unspecified site of left female breast: Secondary | ICD-10-CM

## 2011-07-26 DIAGNOSIS — C50919 Malignant neoplasm of unspecified site of unspecified female breast: Secondary | ICD-10-CM | POA: Insufficient documentation

## 2011-07-26 DIAGNOSIS — Z01812 Encounter for preprocedural laboratory examination: Secondary | ICD-10-CM | POA: Insufficient documentation

## 2011-07-26 HISTORY — PX: PORTACATH PLACEMENT: SHX2246

## 2011-07-26 LAB — POCT HEMOGLOBIN-HEMACUE: Hemoglobin: 11 g/dL — ABNORMAL LOW (ref 12.0–15.0)

## 2011-07-26 SURGERY — INSERTION, TUNNELED CENTRAL VENOUS DEVICE, WITH PORT
Anesthesia: Choice | Site: Breast

## 2011-07-26 MED ORDER — PROPOFOL 10 MG/ML IV EMUL
INTRAVENOUS | Status: DC | PRN
  Administered 2011-07-26 (×3): 20 mg via INTRAVENOUS
  Administered 2011-07-26: 10 mg via INTRAVENOUS

## 2011-07-26 MED ORDER — FENTANYL CITRATE 0.05 MG/ML IJ SOLN
INTRAMUSCULAR | Status: DC | PRN
  Administered 2011-07-26: 100 ug via INTRAVENOUS

## 2011-07-26 MED ORDER — FENTANYL CITRATE 0.05 MG/ML IJ SOLN: 50.0000 ug | INTRAMUSCULAR | Status: DC | PRN

## 2011-07-26 MED ORDER — METOCLOPRAMIDE HCL 5 MG/ML IJ SOLN: 10.0000 mg | Freq: Once | INTRAMUSCULAR | Status: DC | PRN

## 2011-07-26 MED ORDER — HEPARIN (PORCINE) IN NACL 2-0.9 UNIT/ML-% IJ SOLN
INTRAMUSCULAR | Status: DC | PRN
  Administered 2011-07-26: 1 via INTRAVENOUS

## 2011-07-26 MED ORDER — ONDANSETRON HCL 4 MG/2ML IJ SOLN
INTRAMUSCULAR | Status: DC | PRN
  Administered 2011-07-26: 4 mg via INTRAVENOUS

## 2011-07-26 MED ORDER — OXYCODONE-ACETAMINOPHEN 5-325 MG PO TABS
1.0000 | ORAL_TABLET | Freq: Once | ORAL | Status: AC | PRN
  Administered 2011-07-26: 1 via ORAL

## 2011-07-26 MED ORDER — OXYCODONE-ACETAMINOPHEN 5-325 MG PO TABS: 1.0000 | ORAL_TABLET | ORAL | Status: AC | PRN

## 2011-07-26 MED ORDER — LACTATED RINGERS IV SOLN
INTRAVENOUS | Status: DC
  Administered 2011-07-26: 07:00:00 via INTRAVENOUS

## 2011-07-26 MED ORDER — CIPROFLOXACIN IN D5W 400 MG/200ML IV SOLN
400.0000 mg | INTRAVENOUS | Status: AC
  Administered 2011-07-26 (×2): 400 mg via INTRAVENOUS

## 2011-07-26 MED ORDER — MIDAZOLAM HCL 5 MG/5ML IJ SOLN
INTRAMUSCULAR | Status: DC | PRN
  Administered 2011-07-26: 2 mg via INTRAVENOUS

## 2011-07-26 MED ORDER — HEPARIN SOD (PORK) LOCK FLUSH 100 UNIT/ML IV SOLN
INTRAVENOUS | Status: DC | PRN
  Administered 2011-07-26: 4 [IU] via INTRAVENOUS

## 2011-07-26 MED ORDER — LIDOCAINE HCL (PF) 1 % IJ SOLN
INTRAMUSCULAR | Status: DC | PRN
  Administered 2011-07-26: 12 mL

## 2011-07-26 MED ORDER — FENTANYL CITRATE 0.05 MG/ML IJ SOLN: 25.0000 ug | INTRAMUSCULAR | Status: DC | PRN

## 2011-07-26 MED ORDER — MIDAZOLAM HCL 2 MG/2ML IJ SOLN: 0.5000 mg | INTRAMUSCULAR | Status: DC | PRN

## 2011-07-26 SURGICAL SUPPLY — 49 items
ADH SKN CLS APL DERMABOND .7 (GAUZE/BANDAGES/DRESSINGS) ×1
APL SKNCLS STERI-STRIP NONHPOA (GAUZE/BANDAGES/DRESSINGS)
BAG DECANTER FOR FLEXI CONT (MISCELLANEOUS) ×2 IMPLANT
BENZOIN TINCTURE PRP APPL 2/3 (GAUZE/BANDAGES/DRESSINGS) IMPLANT
BLADE HEX COATED 2.75 (ELECTRODE) ×2 IMPLANT
BLADE SURG 15 STRL LF DISP TIS (BLADE) ×1 IMPLANT
BLADE SURG 15 STRL SS (BLADE) ×2
CANISTER SUCTION 1200CC (MISCELLANEOUS) IMPLANT
CHLORAPREP W/TINT 26ML (MISCELLANEOUS) ×2 IMPLANT
CLOTH BEACON ORANGE TIMEOUT ST (SAFETY) ×2 IMPLANT
COVER MAYO STAND STRL (DRAPES) ×2 IMPLANT
COVER TABLE BACK 60X90 (DRAPES) ×2 IMPLANT
DECANTER SPIKE VIAL GLASS SM (MISCELLANEOUS) ×1 IMPLANT
DERMABOND ADVANCED (GAUZE/BANDAGES/DRESSINGS) ×1
DERMABOND ADVANCED .7 DNX12 (GAUZE/BANDAGES/DRESSINGS) IMPLANT
DRAPE C-ARM 42X72 X-RAY (DRAPES) ×2 IMPLANT
DRAPE LAPAROTOMY TRNSV 102X78 (DRAPE) ×2 IMPLANT
DRAPE UTILITY XL STRL (DRAPES) ×2 IMPLANT
ELECT REM PT RETURN 9FT ADLT (ELECTROSURGICAL) ×2
ELECTRODE REM PT RTRN 9FT ADLT (ELECTROSURGICAL) ×1 IMPLANT
GLOVE EUDERMIC 7 POWDERFREE (GLOVE) ×2 IMPLANT
GOWN PREVENTION PLUS XLARGE (GOWN DISPOSABLE) ×4 IMPLANT
IV CATH PLACEMENT UNIT 16 GA (IV SOLUTION) IMPLANT
IV HEPARIN 1000UNITS/500ML (IV SOLUTION) ×2 IMPLANT
IV KIT MINILOC 20X1 SAFETY (NEEDLE) IMPLANT
KIT BARDPORT ISP (Port) IMPLANT
KIT PORT POWER ISP 8FR (Catheter) IMPLANT
KIT POWER CATH 8FR (Catheter) IMPLANT
KIT POWER PORT SLIM 6FR (PORTABLE EQUIPMENT SUPPLIES) ×1 IMPLANT
NDL HYPO 25X1 1.5 SAFETY (NEEDLE) ×1 IMPLANT
NEEDLE HYPO 25X1 1.5 SAFETY (NEEDLE) ×2 IMPLANT
PACK BASIN DAY SURGERY FS (CUSTOM PROCEDURE TRAY) ×2 IMPLANT
PENCIL BUTTON HOLSTER BLD 10FT (ELECTRODE) ×2 IMPLANT
SET SHEATH INTRODUCER 10FR (MISCELLANEOUS) IMPLANT
SHEATH COOK PEEL AWAY SET 9F (SHEATH) IMPLANT
SHEET MEDIUM DRAPE 40X70 STRL (DRAPES) IMPLANT
SLEEVE SCD COMPRESS KNEE MED (MISCELLANEOUS) ×2 IMPLANT
STAPLER VISISTAT (STAPLE) IMPLANT
STRIP CLOSURE SKIN 1/2X4 (GAUZE/BANDAGES/DRESSINGS) IMPLANT
SUT MNCRL AB 4-0 PS2 18 (SUTURE) ×2 IMPLANT
SUT PROLENE 2 0 SH DA (SUTURE) ×2 IMPLANT
SUT VICRYL 3-0 CR8 SH (SUTURE) ×2 IMPLANT
SYR 5ML LUER SLIP (SYRINGE) ×2 IMPLANT
SYR CONTROL 10ML LL (SYRINGE) ×2 IMPLANT
TOWEL OR 17X24 6PK STRL BLUE (TOWEL DISPOSABLE) ×4 IMPLANT
TOWEL OR NON WOVEN STRL DISP B (DISPOSABLE) ×2 IMPLANT
TUBE CONNECTING 20X1/4 (TUBING) IMPLANT
WATER STERILE IRR 1000ML POUR (IV SOLUTION) ×1 IMPLANT
YANKAUER SUCT BULB TIP NO VENT (SUCTIONS) IMPLANT

## 2011-07-26 NOTE — Op Note (Signed)
Beth Davis 1950-12-18 [id number] 07/19/2011  Preoperative diagnosis: PAC needed  Postoperative diagnosis: Same  Procedure: Portacath Placement  Surgeon: Currie Paris, MD, FACS  Anesthesia: MAC combined with regional for post-op pain  Clinical History and Indications: The patient is getting ready to begin chemotherapy for her cancer. She  needs a Port-A-Cath for venous access.  Description of Procedure: I have seen the patient in the holding area and confirmed the plans for the procedure as noted above. I reviewed the risks and complications again and the patient has no further questions.  The patient was then taken to the operating room. After satisfactory IV sedatioinanesthesia had been obtained the upper chest and lower neck were prepped and draped as a sterile field. The timeout was done.  The right subclavian vein was entered and the guidewire threaded into the superior vena cava right atrial area under fluoroscopic guidance. An incision was then made on the anterior chest wall and a subcutaneous pocket fashioned for the port reservoir.  The port tubing was then brought through a subcutaneous tunnel from the port site to the guidewire site. The dilator and peel-away sheath were then advanced over the guidewire while monitoring this with fluoroscopy. The guidewire and dilator were removed and the tubing threaded to approximately 22 cm. The peel-away sheath was then removed. The catheter aspirated and flushed easily. Using fluoroscopy the tip was backed out into the superior vena cava right atrial junction area. It aspirated and flushed easily. The reservoir was attached and the locking mechanism engaged. That aspirated and flushed easily.  The reservoir was secured to the fascia with 2 sutures of 2-0 Prolene. A final check with fluoroscopy was done to make sure we had no kinks and good positioning of the tip of the catheter. Everything appeared to be okay. The catheter was  aspirated, flushed with dilute heparin and then concentrated aqueous heparin.  The incision was then closed with interrupted 3-0 Vicryl, and 4-0 Monocryl subcuticular with Dermabond on the skin.  There were no operative complications. Estimated blood loss was minimal. All counts were correct. The patient tolerated the procedure well.  Currie Paris, MD, FACS 07/26/2011 8:12 AM

## 2011-07-26 NOTE — Anesthesia Postprocedure Evaluation (Signed)
  Anesthesia Post Note  Patient: Beth Davis  Procedure(s) Performed:  INSERTION PORT-A-CATH - port a cath placement  Anesthesia type: General  Patient location: PACU  Post pain: Pain level controlled  Post assessment: Patient's Cardiovascular Status Stable  Last Vitals:  Filed Vitals:   07/26/11 0845  BP: 132/76  Pulse: 59  Temp:   Resp: 12    Post vital signs: Reviewed and stable  Level of consciousness: alert  Complications: No apparent anesthesia complications

## 2011-07-26 NOTE — Transfer of Care (Signed)
Immediate Anesthesia Transfer of Care Note  Patient: Beth Davis  Procedure(s) Performed:  INSERTION PORT-A-CATH - port a cath placement  Patient Location: PACU  Anesthesia Type: MAC  Level of Consciousness: awake, alert  and oriented  Airway & Oxygen Therapy: Patient Spontanous Breathing  Post-op Assessment: Report given to PACU RN and Post -op Vital signs reviewed and stable  Post vital signs: Reviewed and stable  Complications: No apparent anesthesia complications

## 2011-07-26 NOTE — Anesthesia Procedure Notes (Addendum)
Performed by: Zenia Resides D   Procedure Name: MAC Date/Time: 07/26/2011 7:30 AM Performed by: Zenia Resides D Pre-anesthesia Checklist: Patient identified, Emergency Drugs available, Suction available, Patient being monitored and Timeout performed Patient Re-evaluated:Patient Re-evaluated prior to inductionOxygen Delivery Method: Simple face mask

## 2011-07-26 NOTE — Anesthesia Preprocedure Evaluation (Signed)
Anesthesia Evaluation  Patient identified by MRN, date of birth, ID band Patient awake    Reviewed: Allergy & Precautions, H&P , NPO status , Patient's Chart, lab work & pertinent test results, reviewed documented beta blocker date and time   Airway Mallampati: II TM Distance: >3 FB Neck ROM: full    Dental   Pulmonary neg pulmonary ROS,          Cardiovascular neg cardio ROS     Neuro/Psych Negative Neurological ROS  Negative Psych ROS   GI/Hepatic negative GI ROS, Neg liver ROS,   Endo/Other    Renal/GU negative Renal ROS  Genitourinary negative   Musculoskeletal   Abdominal   Peds  Hematology negative hematology ROS (+)   Anesthesia Other Findings See surgeon's H&P   Reproductive/Obstetrics negative OB ROS                           Anesthesia Physical Anesthesia Plan  ASA: II  Anesthesia Plan: MAC   Post-op Pain Management:    Induction: Intravenous  Airway Management Planned: Simple Face Mask  Additional Equipment:   Intra-op Plan:   Post-operative Plan:   Informed Consent: I have reviewed the patients History and Physical, chart, labs and discussed the procedure including the risks, benefits and alternatives for the proposed anesthesia with the patient or authorized representative who has indicated his/her understanding and acceptance.     Plan Discussed with: CRNA and Surgeon  Anesthesia Plan Comments:         Anesthesia Quick Evaluation

## 2011-07-26 NOTE — H&P (Signed)
Beth Davis is an 60 y.o. female.   Chief Complaint: Needs PAC HPI: Patient has elected to have chemo after surgery for a breast cancer  Past Medical History  Diagnosis Date  . Hypothyroidism   . Collagenous colitis   . Carpal tunnel syndrome of right wrist   . Osteoporosis   . Breast cancer, IDC, Left, ER +, PR -, Her2 -. 06/01/2011    Past Surgical History  Procedure Date  . Abdominal hysterectomy   . Laparoscopy abdomen diagnostic   . Orif femur fracture   . Orif humerus decompression   . Colonoscopy 2012  . Mastectomy 06/14/2011    Right - prophylactic  . Simple mastectomy w/ sentinel node biopsy 06/14/2011    Left  . Breast surgery 2004    excision left breast fibroadenoma  . Mastectomy     rt simple-total lt with 4 nodes on lt    Family History  Problem Relation Age of Onset  . Cancer Mother     breast   Social History:  reports that she quit smoking about 36 years ago. Her smoking use included Cigarettes. She smoked .5 packs per day. She does not have any smokeless tobacco history on file. She reports that she does not drink alcohol or use illicit drugs.  Allergies:  Allergies  Allergen Reactions  . Codeine   . Erythromycin Nausea Only  . Penicillins Hives    Medications Prior to Admission  Medication Dose Route Frequency Provider Last Rate Last Dose  . ciprofloxacin (CIPRO) IVPB 400 mg  400 mg Intravenous 120 min pre-op Currie Paris, MD   400 mg at 07/26/11 0657  . fentaNYL (SUBLIMAZE) injection 50-100 mcg  50-100 mcg Intravenous PRN Constance Goltz, MD      . lactated ringers infusion   Intravenous Continuous Zenon Mayo, MD 20 mL/hr at 07/26/11 [phone]    . midazolam (VERSED) injection 0.5-2 mg  0.5-2 mg Intravenous PRN Constance Goltz, MD       Medications Prior to Admission  Medication Sig Dispense Refill  . cholecalciferol (VITAMIN D) 1000 UNITS tablet Take 1,000 Units by mouth daily.        . frovatriptan  (FROVA) 2.5 MG tablet Take 2.5 mg by mouth as needed. If recurs, may repeat after 2 hours. Max of 3 tabs in 24 hours.       Marland Kitchen levothyroxine (SYNTHROID, LEVOTHROID) 50 MCG tablet Take 50 mcg by mouth daily.        Marland Kitchen liothyronine (CYTOMEL) 5 MCG tablet Take 5 mcg by mouth daily.        . promethazine (PHENERGAN) 25 MG tablet         No results found for this or any previous visit (from the past 48 hour(s)). No results found.  ROSNo change from original ROS  Blood pressure 126/83, pulse 67, temperature 98.2 F (36.8 C), temperature source Oral, resp. rate 18, SpO2 100.00%. Physical Exam  Constitutional: She appears well-developed and well-nourished. No distress.  HENT:  Head: Normocephalic and atraumatic.  Eyes: Pupils are equal, round, and reactive to light.  Neck: No tracheal deviation present. No thyromegaly present.  Cardiovascular: Normal rate and regular rhythm.  Exam reveals no gallop and no friction rub.   No murmur heard. Respiratory: Breath sounds normal.  Skin: Skin is warm and dry.   S/P bilateral mastectomy no problems with wounds  Assessment/Plan Breast cancer needs [facility] Plan Port placement. Patient aware of indications risks and complications  Jimmey Hengel J 07/26/2011, 7:11 AM

## 2011-07-27 ENCOUNTER — Encounter: Payer: Commercial Managed Care - PPO | Admitting: Physical Therapy

## 2011-07-28 ENCOUNTER — Encounter (HOSPITAL_BASED_OUTPATIENT_CLINIC_OR_DEPARTMENT_OTHER): Payer: Self-pay | Admitting: Surgery

## 2011-07-29 ENCOUNTER — Ambulatory Visit (HOSPITAL_BASED_OUTPATIENT_CLINIC_OR_DEPARTMENT_OTHER): Payer: Commercial Managed Care - PPO | Admitting: Oncology

## 2011-07-29 VITALS — BP 121/78 | HR 80 | Temp 97.6°F | Ht 62.5 in | Wt 100.3 lb

## 2011-07-29 DIAGNOSIS — C50912 Malignant neoplasm of unspecified site of left female breast: Secondary | ICD-10-CM

## 2011-07-29 DIAGNOSIS — Z170 Estrogen receptor positive status [ER+]: Secondary | ICD-10-CM

## 2011-07-29 DIAGNOSIS — C50919 Malignant neoplasm of unspecified site of unspecified female breast: Secondary | ICD-10-CM

## 2011-07-29 NOTE — Progress Notes (Signed)
ID: [name]  History of present illness: Beth Davis is a 60 year old [place] woman who underwent diagnostic mammography October of 2010 showing bilateral microcalcifications. This was followed up by breast specific gamma imaging which showed no abnormal isotope activity. Mammography October 2011 was again unremarkable, except for the fact that she does have very dense breasts. An 05/24/2011 however new calcifications were identified in the upper outer quadrant of the left breast. This time breast specific gamma imaging showed increased activity posteriorly in the left breast, near the chest wall. 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 The invasive tumor measured 1 mm, was intermediate grade, and positive for E-cadherin. It was estrogen receptor positive, progesterone receptor and HER-2 negative, with an MIB-1 of 25%.  Bilateral MRIs 05/30/2011 showed vague asymmetric enhancement in the lateral left breast measuring up to 8 mm. There were no other suspicious masses, no axillary or internal mammary adenopathy, and a 7 mm lesion incidentally noted in the liver was felt to be likely a cyst or hemangioma.  The patient was seen in the multidisciplinary breast cancer clinic 06/01/2011, and breast conserving surgery was recommended. However, the patient opted for bilateral mastectomies given her history of dense breasts and the difficulty finding the invasive tumor as detailed above. Accordingly, on 06/14/2011 the patient underwent bilateral mastectomies with left axillary lymph node sampling. The final pathology ([id number]) showed the right mastectomy to be benign, with 0 of 4 lymph nodes sampled. On the left side in addition to ductal carcinoma in situ, there was an 8 mm invasive ductal carcinoma, grade 2, as well as other smaller foci of invasive tumor in different  quadrants. All 7 left axillary lymph nodes were clear. HER-2 was repeated and again was found to be not amplified.  On December 3 of 2012 the patient returned to discuss adjuvant therapy. NCCN guidelines for patients in her situation suggest an Oncotype DX study, and at her prognosis fell in the intermediate category, predicting a 19% risk of recurrence if she took tamoxifen for 5 years. Again NCCN guidelines recommend to consider chemotherapy in this setting. After much discussion the decision was made that the patient would receive 4 cycles of cyclophosphamide and docetaxel, to be followed by a 5-10 years of anti-estrogen therapy.  Interval history: Since her last visit here, Beth Davis had her Port-A-Cath placed. She tolerated this well. She had many questions regarding hair loss, nausea, and peripheral neuropathy, after having come to chemotherapy school. These questions were addressed in detail. She is very eager to initiate her treatments.  ROS:  She has no edema or swelling in either of the upper extremities, with good range of motion bilaterally per her report. There has been no unusual pain fever or rash bleeding or other systemic symptoms. A detailed review of systems was otherwise noncontributory. Note that she is not interested in proceeding to reconstruction.  Medications: I have reviewed the patient's current medications.  Current Outpatient Prescriptions  Medication Sig Dispense Refill  . cholecalciferol (VITAMIN D) 1000 UNITS tablet Take 1,000 Units by mouth daily.        . frovatriptan (FROVA) 2.5 MG tablet Take 2.5 mg by mouth as needed. If recurs, may repeat after 2 hours. Max of 3 tabs in 24 hours.       Marland Kitchen levothyroxine (SYNTHROID, LEVOTHROID) 50 MCG tablet Take 50 mcg by mouth daily.        Marland Kitchen  liothyronine (CYTOMEL) 5 MCG tablet Take 5 mcg by mouth daily.        Marland Kitchen oxyCODONE-acetaminophen (ROXICET) 5-325 MG per tablet Take 1 tablet by mouth every 4 (four) hours as needed for pain.  10  tablet  0  . promethazine (PHENERGAN) 25 MG tablet        Family history: The patient's mother is alive at age 58. She had breast cancer diagnosed at the age of 18. The patient has 3 brothers no sisters. The patient's father died at the age of 32. A cousin on her father's side had breast cancer at the age of 1.  Gynecologic history: She had menarche age 74, simple hysterectomy in 2006. She is GX P2, first pregnancy to term age 60. She used birth control pills between 1972 and 1974. She took Clomid for fertility between 1983 and 1986.  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 Her husband [name] (goes by [name]) is a Clinical research associate here in town. Daughter [name] 26 lives in [place]. She works there as a Education officer, community. Dr. [name], 25, is a Gaffer and [place] in nutrition and diet headaches.   Objective:  Filed Vitals:   07/29/11 0956  BP: 121/78  Pulse: 80  Temp: 97.6 F (36.4 C)   Body mass index is 18.05 kg/(m^2).  Physical Exam:    Sclerae unicteric  Oropharynx clear  No peripheral adenopathy  Lungs clear -- no rales or rhonchi  Heart regular rate and rhythm  Abdomen benign  MSK no focal spinal tenderness, no peripheral edema  Neuro nonfocal  Breast exam: Status post bilateral mastectomies; no evidence of local recurrence on the left. No suspicious findings on the right.  Lab Results: baseline CA 27-29 was noninformative at 15  CMP      Component Value Date/Time   NA 142 06/09/2011 1240   K 5.1 06/09/2011 1240   CL 105 06/09/2011 1240   CO2 29 06/09/2011 1240   GLUCOSE 93 06/09/2011 1240   BUN 7 06/09/2011 1240   CREATININE 0.58 06/09/2011 1240   CALCIUM 10.1 06/09/2011 1240   PROT 7.0 06/01/2011 1218   PROT 7.0 06/01/2011 1218   ALBUMIN 4.4 06/01/2011 1218   ALBUMIN 4.4 06/01/2011 1218   AST 16 06/01/2011 1218   AST 16 06/01/2011 1218   ALT 15  06/01/2011 1218   ALT 15 06/01/2011 1218   ALKPHOS 95 06/01/2011 1218   ALKPHOS 95 06/01/2011 1218   BILITOT 0.4 06/01/2011 1218   BILITOT 0.4 06/01/2011 1218   GFRNONAA >90 06/09/2011 1240   GFRAA >90 06/09/2011 1240    CBC Lab Results  Component Value Date   WBC 4.8 06/01/2011   HGB 11.0* 07/26/2011   HCT 40.5 06/01/2011   MCV 92.6 06/01/2011   PLT 207 06/01/2011    Studies/Results:  No new studies found.  Assessment: 60 year old [place] woman status post bilateral mastectomies October of 2012 for a left sided invasive ductal carcinoma, T1b N0 or Stage I, estrogen receptor 95% positive, progesterone receptor negative, with no HER-2 amplification and an MIB-1 of 6%; with an Oncotype DX score in the intermediate range, predicting a 19% risk of recurrence with 5 years of tamoxifen.   Plan: She is scheduled for her first of 4 planned cycles of cyclophosphamide and docetaxel 08/01/2011. She will start premedications with dexamethasone 2 tablets twice daily on the day before chemotherapy.  On the day of chemotherapy of course she will use her EMLA cream and that evening she will start ondansetron 8 mg twice daily and metoclopramide 25 mg a.c. and at bedtime, to be continued for 3 days. On day 2 from chemotherapy she will resume the dexamethasone and continue that at 8 mg twice daily for days 2, 3 and 4. In addition she will start TobraDex eyedrops day 0 and continue for one week with each chemotherapy cycle.  All these prescriptions were written for her today she has an appointment for the second week of treatment and then on the third week of the first cycle we will do lab work only. We will see her on each day of treatment or the day before, and a week after until she completes her chemotherapy. At that time she will return to see me specifically and we will discuss anti-estrogens.   Caytlyn Evers C 07/29/2011

## 2011-08-01 ENCOUNTER — Ambulatory Visit: Payer: Commercial Managed Care - PPO

## 2011-08-01 ENCOUNTER — Encounter: Payer: Commercial Managed Care - PPO | Admitting: Physical Therapy

## 2011-08-01 ENCOUNTER — Ambulatory Visit (HOSPITAL_BASED_OUTPATIENT_CLINIC_OR_DEPARTMENT_OTHER): Payer: Commercial Managed Care - PPO

## 2011-08-01 ENCOUNTER — Other Ambulatory Visit: Payer: Commercial Managed Care - PPO

## 2011-08-01 ENCOUNTER — Other Ambulatory Visit: Payer: Commercial Managed Care - PPO | Admitting: Lab

## 2011-08-01 VITALS — BP 93/58 | HR 88 | Temp 98.2°F

## 2011-08-01 DIAGNOSIS — Z5111 Encounter for antineoplastic chemotherapy: Secondary | ICD-10-CM

## 2011-08-01 DIAGNOSIS — C50419 Malignant neoplasm of upper-outer quadrant of unspecified female breast: Secondary | ICD-10-CM

## 2011-08-01 DIAGNOSIS — C50912 Malignant neoplasm of unspecified site of left female breast: Secondary | ICD-10-CM

## 2011-08-01 DIAGNOSIS — Z170 Estrogen receptor positive status [ER+]: Secondary | ICD-10-CM

## 2011-08-01 MED ORDER — DEXAMETHASONE SODIUM PHOSPHATE 4 MG/ML IJ SOLN
20.0000 mg | Freq: Once | INTRAMUSCULAR | Status: AC
  Administered 2011-08-01: 20 mg via INTRAVENOUS

## 2011-08-01 MED ORDER — ONDANSETRON 16 MG/50ML IVPB (CHCC)
16.0000 mg | Freq: Once | INTRAVENOUS | Status: AC
  Administered 2011-08-01: 16 mg via INTRAVENOUS

## 2011-08-01 MED ORDER — SODIUM CHLORIDE 0.9 % IV SOLN
Freq: Once | INTRAVENOUS | Status: AC
  Administered 2011-08-01: 09:00:00 via INTRAVENOUS

## 2011-08-01 MED ORDER — HEPARIN SOD (PORK) LOCK FLUSH 100 UNIT/ML IV SOLN
500.0000 [IU] | Freq: Once | INTRAVENOUS | Status: AC | PRN
  Administered 2011-08-01: 500 [IU]
  Filled 2011-08-01: qty 5

## 2011-08-01 MED ORDER — SODIUM CHLORIDE 0.9 % IV SOLN
600.0000 mg/m2 | Freq: Once | INTRAVENOUS | Status: AC
  Administered 2011-08-01: 880 mg via INTRAVENOUS
  Filled 2011-08-01: qty 44

## 2011-08-01 MED ORDER — SODIUM CHLORIDE 0.9 % IJ SOLN
10.0000 mL | INTRAMUSCULAR | Status: DC | PRN
Start: 2011-08-01 — End: 2011-08-01
  Administered 2011-08-01: 10 mL
  Filled 2011-08-01: qty 10

## 2011-08-01 MED ORDER — DOCETAXEL CHEMO INJECTION 160 MG/16ML
75.0000 mg/m2 | Freq: Once | INTRAVENOUS | Status: AC
  Administered 2011-08-01: 110 mg via INTRAVENOUS
  Filled 2011-08-01: qty 11

## 2011-08-01 NOTE — Patient Instructions (Signed)
1255 Pt ambulatory upon discharge and tolerated 1st treatment well.  Verbalized understanding of next appt date/time and to call if any problems.

## 2011-08-02 ENCOUNTER — Ambulatory Visit (HOSPITAL_BASED_OUTPATIENT_CLINIC_OR_DEPARTMENT_OTHER): Payer: Commercial Managed Care - PPO

## 2011-08-02 ENCOUNTER — Telehealth: Payer: Self-pay | Admitting: *Deleted

## 2011-08-02 VITALS — BP 89/49 | HR 67 | Temp 97.8°F

## 2011-08-02 DIAGNOSIS — C50419 Malignant neoplasm of upper-outer quadrant of unspecified female breast: Secondary | ICD-10-CM

## 2011-08-02 DIAGNOSIS — C50912 Malignant neoplasm of unspecified site of left female breast: Secondary | ICD-10-CM

## 2011-08-02 MED ORDER — PEGFILGRASTIM INJECTION 6 MG/0.6ML
6.0000 mg | Freq: Once | SUBCUTANEOUS | Status: AC
  Administered 2011-08-02: 6 mg via SUBCUTANEOUS
  Filled 2011-08-02: qty 0.6

## 2011-08-02 NOTE — Telephone Encounter (Signed)
Spoke with pt. While here for injection.  She is doing well.  Has eaten and drinking fluids well.  Denies questions.  Encouraged to try tylenol, ibuprofen or claritin for any bone pain after today's injection.

## 2011-08-02 NOTE — Telephone Encounter (Signed)
Message copied by Augusto Garbe on Tue Aug 02, 2011 10:45 AM ------      Message from: [place], [place] P      Created: Mon Aug 01, 2011 12:21 PM      Regarding: chemo follow-up call      Contact: [phone]       Taxotere/Cytoxan     Dr. Darnelle Catalan

## 2011-08-03 ENCOUNTER — Encounter: Payer: Self-pay | Admitting: Oncology

## 2011-08-03 ENCOUNTER — Encounter: Payer: Commercial Managed Care - PPO | Admitting: Physical Therapy

## 2011-08-08 ENCOUNTER — Other Ambulatory Visit: Payer: Self-pay | Admitting: *Deleted

## 2011-08-08 DIAGNOSIS — C50919 Malignant neoplasm of unspecified site of unspecified female breast: Secondary | ICD-10-CM

## 2011-08-08 MED ORDER — PROCHLORPERAZINE MALEATE 10 MG PO TABS: 10.0000 mg | ORAL_TABLET | Freq: Four times a day (QID) | ORAL | Status: AC | PRN

## 2011-08-12 ENCOUNTER — Other Ambulatory Visit (HOSPITAL_BASED_OUTPATIENT_CLINIC_OR_DEPARTMENT_OTHER): Payer: Commercial Managed Care - PPO | Admitting: Lab

## 2011-08-12 ENCOUNTER — Ambulatory Visit (HOSPITAL_BASED_OUTPATIENT_CLINIC_OR_DEPARTMENT_OTHER): Payer: Commercial Managed Care - PPO | Admitting: Physician Assistant

## 2011-08-12 ENCOUNTER — Telehealth: Payer: Self-pay | Admitting: *Deleted

## 2011-08-12 ENCOUNTER — Encounter: Payer: Self-pay | Admitting: Physician Assistant

## 2011-08-12 VITALS — BP 119/77 | HR 75 | Temp 98.4°F | Ht 62.5 in | Wt 104.5 lb

## 2011-08-12 DIAGNOSIS — C50919 Malignant neoplasm of unspecified site of unspecified female breast: Secondary | ICD-10-CM

## 2011-08-12 DIAGNOSIS — Z9010 Acquired absence of unspecified breast and nipple: Secondary | ICD-10-CM

## 2011-08-12 DIAGNOSIS — C50912 Malignant neoplasm of unspecified site of left female breast: Secondary | ICD-10-CM

## 2011-08-12 DIAGNOSIS — Z170 Estrogen receptor positive status [ER+]: Secondary | ICD-10-CM

## 2011-08-12 DIAGNOSIS — Z79899 Other long term (current) drug therapy: Secondary | ICD-10-CM

## 2011-08-12 LAB — CBC WITH DIFFERENTIAL/PLATELET
Basophils Absolute: 0 10*3/uL (ref 0.0–0.1)
Eosinophils Absolute: 0 10*3/uL (ref 0.0–0.5)
HGB: 12.3 g/dL (ref 11.6–15.9)
MCV: 92.9 fL (ref 79.5–101.0)
MONO#: 0.7 10*3/uL (ref 0.1–0.9)
MONO%: 4.9 % (ref 0.0–14.0)
NEUT#: 11.2 10*3/uL — ABNORMAL HIGH (ref 1.5–6.5)
RBC: 3.9 10*6/uL (ref 3.70–5.45)
RDW: 13.4 % (ref 11.2–14.5)
WBC: 13.5 10*3/uL — ABNORMAL HIGH (ref 3.9–10.3)

## 2011-08-12 NOTE — Progress Notes (Signed)
ID: [name]  History of present illness: Beth Davis is a 60 year old [place] woman who underwent diagnostic mammography October of 2010 showing bilateral microcalcifications. This was followed up by breast specific gamma imaging which showed no abnormal isotope activity. Mammography October 2011 was again unremarkable, except for the fact that she does have very dense breasts. An 05/24/2011 however new calcifications were identified in the upper outer quadrant of the left breast. This time breast specific gamma imaging showed increased activity posteriorly in the left breast, near the chest wall. 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 The invasive tumor measured 1 mm, was intermediate grade, and positive for E-cadherin. It was estrogen receptor positive, progesterone receptor and HER-2 negative, with an MIB-1 of 25%.  Bilateral MRIs 05/30/2011 showed vague asymmetric enhancement in the lateral left breast measuring up to 8 mm. There were no other suspicious masses, no axillary or internal mammary adenopathy, and a 7 mm lesion incidentally noted in the liver was felt to be likely a cyst or hemangioma.  The patient was seen in the multidisciplinary breast cancer clinic 06/01/2011, and breast conserving surgery was recommended. However, the patient opted for bilateral mastectomies given her history of dense breasts and the difficulty finding the invasive tumor as detailed above. Accordingly, on 06/14/2011 the patient underwent bilateral mastectomies with left axillary lymph node sampling. The final pathology ([id number]) showed the right mastectomy to be benign, with 0 of 4 lymph nodes sampled. On the left side in addition to ductal carcinoma in situ, there was an 8 mm invasive ductal carcinoma, grade 2, as well as other smaller foci of invasive tumor in different  quadrants. All 7 left axillary lymph nodes were clear. HER-2 was repeated and again was found to be not amplified.  On December 3 of 2012 the patient returned to discuss adjuvant therapy. NCCN guidelines for patients in her situation suggest an Oncotype DX study, and at her prognosis fell in the intermediate category, predicting a 19% risk of recurrence if she took tamoxifen for 5 years. Again NCCN guidelines recommend to consider chemotherapy in this setting. After much discussion the decision was made that the patient would receive 4 cycles of cyclophosphamide and docetaxel, to be followed by a 5-10 years of anti-estrogen therapy.  Interval history:  Beth Davis returns today accompanied by her daughter for assessment of chemotoxicity on day 12 cycle 1 of 4 planned q. three-week doses of docetaxel/cyclophosphamide given in the adjuvant setting for left breast carcinoma. She received Neulasta on day 2 for granulocyte support.  Overall Beth Davis feels that she tolerated treatment well. She does have a history of microscopic colitis which was here dated by the metoclopramide. She's had some subsequent diarrhea which is improving. Metoclopramide was discontinued and a prescription was called in for prochlorperazine. She will substitute prochlorperazine beginning with cycle 2. Fortunately, however, she's had no problems at all with nausea or emesis.  Kathy tolerated the dexamethasone well, but "crashed" when she came off of the medication. We discussed a taper beginning with cycle 2. She's also had some difficulty sleeping.  Patient had some sensitivity and slight burning in her fingertips for the first several days although this has improved. She's had no frank cracking or peeling. No frank signs of peripheral neuropathy in either the upper or lower extremities. No additional skin changes, no bed changes, or nail bed sensitivity. No mouth ulcers or oral sensitivity. No  excessive tearing. No fevers, chills, or night  sweats. Energy level is fair.   A detailed review of systems is otherwise noncontributory.   Medications: I have reviewed the patient's current medications.  Current Outpatient Prescriptions  Medication Sig Dispense Refill  . cholecalciferol (VITAMIN D) 1000 UNITS tablet Take 1,000 Units by mouth daily.        Marland Kitchen dexamethasone (DECADRON) 4 MG tablet Take 4 mg by mouth as directed. 2 tabs po bid on day before chemo, then x 3 days after chemo      . frovatriptan (FROVA) 2.5 MG tablet Take 2.5 mg by mouth as needed. If recurs, may repeat after 2 hours. Max of 3 tabs in 24 hours.       Marland Kitchen levothyroxine (SYNTHROID, LEVOTHROID) 50 MCG tablet Take 50 mcg by mouth daily.        Marland Kitchen lidocaine-prilocaine (EMLA) cream Apply topically as needed.      Marland Kitchen liothyronine (CYTOMEL) 5 MCG tablet Take 5 mcg by mouth daily.        Marland Kitchen LORazepam (ATIVAN) 0.5 MG tablet Take 0.5 mg by mouth as needed.      . ondansetron (ZOFRAN) 8 MG tablet Take 8 mg by mouth Twice daily as needed.      Marland Kitchen oxyCODONE-acetaminophen (PERCOCET) 5-325 MG per tablet Take 1 tablet by mouth as needed.      . prochlorperazine (COMPAZINE) 10 MG tablet Take 1 tablet (10 mg total) by mouth every 6 (six) hours as needed.  60 tablet  3  . tobramycin-dexamethasone (TOBRADEX) ophthalmic solution Apply 1 drop to eye Twice daily.      . valACYclovir (VALTREX) 1000 MG tablet Take 1 g by mouth Daily.      . metoCLOPramide (REGLAN) 10 MG tablet Take 10 mg by mouth 4 times daily (before meals and at bedtime).      . promethazine (PHENERGAN) 25 MG tablet        Family history: The patient's mother is alive at age 80. She had breast cancer diagnosed at the age of 80. The patient has 3 brothers no sisters. The patient's father died at the age of 84. A cousin on her father's side had breast cancer at the age of 27.  Gynecologic history: She had menarche age 80, simple hysterectomy in 2006. She is GX P2, first pregnancy to term age 53. She used birth control pills  between 1972 and 1974. She took Clomid for fertility between 1983 and 1986.  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 Her husband [name] (goes by [name]) is a Clinical research associate here in town. Daughter [name] 26 lives in [place]. She works there as a Education officer, community. Dr. [name], 25, is a Gaffer and [place] in nutrition and diet headaches.   Objective:  Filed Vitals:   08/12/11 1019  BP: 119/77  Pulse: 75  Temp: 98.4 F (36.9 C)   Body mass index is 18.81 kg/(m^2).  HEENT:  Sclerae anicteric, conjunctivae pink.  Oropharynx clear.  No mucositis or candidiasis.   Nodes:  No cervical, supraclavicular, or axillary lymphadenopathy palpated.  Breast Exam:  Deferred. Lungs:  Clear to auscultation bilaterally.  No crackles, rhonchi, or wheezes.   Heart:  Regular rate and rhythm.   Abdomen:  Soft, nontender.  Positive bowel sounds.  No organomegaly or masses palpated.   Musculoskeletal:  No focal spinal tenderness to palpation.  Extremities:  Benign.  No peripheral edema or cyanosis.   Skin:  Benign.   Neuro:  Nonfocal.  Lab Results: baseline CA 27-29 was noninformative at 15  CMP      Component Value Date/Time   NA 142 06/09/2011 1240   K 5.1 06/09/2011 1240   CL 105 06/09/2011 1240   CO2 29 06/09/2011 1240   GLUCOSE 93 06/09/2011 1240   BUN 7 06/09/2011 1240   CREATININE 0.58 06/09/2011 1240   CALCIUM 10.1 06/09/2011 1240   PROT 7.0 06/01/2011 1218   PROT 7.0 06/01/2011 1218   ALBUMIN 4.4 06/01/2011 1218   ALBUMIN 4.4 06/01/2011 1218   AST 16 06/01/2011 1218   AST 16 06/01/2011 1218   ALT 15 06/01/2011 1218   ALT 15 06/01/2011 1218   ALKPHOS 95 06/01/2011 1218   ALKPHOS 95 06/01/2011 1218   BILITOT 0.4 06/01/2011 1218   BILITOT 0.4 06/01/2011 1218   GFRNONAA >90 06/09/2011 1240   GFRAA >90 06/09/2011 1240    CBC Lab Results  Component Value Date   WBC  13.5* 08/12/2011   HGB 12.3 08/12/2011   HCT 36.2 08/12/2011   MCV 92.9 08/12/2011   PLT 199 08/12/2011    Studies/Results:  No new studies found.  Assessment: 60 year old [place] woman status post bilateral mastectomies October of 2012 for a left sided invasive ductal carcinoma, T1b N0 or Stage I, estrogen receptor 95% positive, progesterone receptor negative, with no HER-2 amplification and an MIB-1 of 6%; with an Oncotype DX score in the intermediate range, predicting a 19% risk of recurrence with 5 years of tamoxifen.    Currently being treated in the adjuvant setting with docetaxel/cyclophosphamide given every 3 weeks. Neulasta is given on day 2 for granulocyte support.    Plan:   Overall, Beth Davis to have tolerated the first cycle of chemotherapy well. As noted above, we will substitute prochlorperazine beginning with cycle 2 in place of metoclopramide. I have given her a schedule to taper the dexamethasone over a total of 7 days following cycle 2. We also discussed the use of lorazepam or Benadryl to help her sleep at night.    She'll return next week for labs only on January 3, then the following week on January 7 to receive her second dose of chemotherapy. I will see her for assessment of chemotoxicities again on January 15.  All these appointments were scheduled accordingly. The plan has been reviewed with the patient who voices understanding and agreement. She will call with any changes or problems.     Azaan Leask, PA-C 08/12/2011

## 2011-08-12 NOTE — Telephone Encounter (Signed)
gave patient appointment for 09-12-2011 and 09-13-2011 printed out calendar and gave to the patient

## 2011-08-18 ENCOUNTER — Other Ambulatory Visit (HOSPITAL_BASED_OUTPATIENT_CLINIC_OR_DEPARTMENT_OTHER): Payer: Commercial Managed Care - PPO | Admitting: Lab

## 2011-08-18 ENCOUNTER — Other Ambulatory Visit: Payer: Commercial Managed Care - PPO | Admitting: Lab

## 2011-08-18 DIAGNOSIS — C50919 Malignant neoplasm of unspecified site of unspecified female breast: Secondary | ICD-10-CM

## 2011-08-18 DIAGNOSIS — C50912 Malignant neoplasm of unspecified site of left female breast: Secondary | ICD-10-CM

## 2011-08-18 LAB — CBC WITH DIFFERENTIAL/PLATELET
Basophils Absolute: 0.1 10*3/uL (ref 0.0–0.1)
Eosinophils Absolute: 0 10*3/uL (ref 0.0–0.5)
HCT: 34.5 % — ABNORMAL LOW (ref 34.8–46.6)
HGB: 11.8 g/dL (ref 11.6–15.9)
LYMPH%: 21.1 % (ref 14.0–49.7)
MCV: 93.1 fL (ref 79.5–101.0)
MONO#: 0.5 10*3/uL (ref 0.1–0.9)
MONO%: 7.4 % (ref 0.0–14.0)
NEUT#: 4.3 10*3/uL (ref 1.5–6.5)
NEUT%: 70.3 % (ref 38.4–76.8)
Platelets: 221 10*3/uL (ref 145–400)
RBC: 3.7 10*6/uL (ref 3.70–5.45)
WBC: 6.2 10*3/uL (ref 3.9–10.3)

## 2011-08-22 ENCOUNTER — Ambulatory Visit (HOSPITAL_BASED_OUTPATIENT_CLINIC_OR_DEPARTMENT_OTHER): Payer: Commercial Managed Care - PPO

## 2011-08-22 VITALS — BP 109/69 | HR 96 | Temp 97.9°F

## 2011-08-22 DIAGNOSIS — C50419 Malignant neoplasm of upper-outer quadrant of unspecified female breast: Secondary | ICD-10-CM

## 2011-08-22 DIAGNOSIS — Z5111 Encounter for antineoplastic chemotherapy: Secondary | ICD-10-CM

## 2011-08-22 DIAGNOSIS — C50912 Malignant neoplasm of unspecified site of left female breast: Secondary | ICD-10-CM

## 2011-08-22 MED ORDER — SODIUM CHLORIDE 0.9 % IV SOLN
600.0000 mg/m2 | Freq: Once | INTRAVENOUS | Status: AC
  Administered 2011-08-22: 880 mg via INTRAVENOUS
  Filled 2011-08-22: qty 44

## 2011-08-22 MED ORDER — DEXAMETHASONE SODIUM PHOSPHATE 4 MG/ML IJ SOLN
20.0000 mg | Freq: Once | INTRAMUSCULAR | Status: AC
  Administered 2011-08-22: 20 mg via INTRAVENOUS

## 2011-08-22 MED ORDER — DOCETAXEL CHEMO INJECTION 160 MG/16ML
75.0000 mg/m2 | Freq: Once | INTRAVENOUS | Status: AC
  Administered 2011-08-22: 110 mg via INTRAVENOUS
  Filled 2011-08-22: qty 11

## 2011-08-22 MED ORDER — SODIUM CHLORIDE 0.9 % IV SOLN
Freq: Once | INTRAVENOUS | Status: AC
  Administered 2011-08-22: 08:00:00 via INTRAVENOUS

## 2011-08-22 MED ORDER — ONDANSETRON 16 MG/50ML IVPB (CHCC)
16.0000 mg | Freq: Once | INTRAVENOUS | Status: AC
  Administered 2011-08-22: 16 mg via INTRAVENOUS

## 2011-08-22 NOTE — Patient Instructions (Signed)
Patient ambulatory out of clinic with daughter.  No complaints at time of discharge.  Instructed patient to call with any issues.  Patient aware of next appointment

## 2011-08-23 ENCOUNTER — Ambulatory Visit (HOSPITAL_BASED_OUTPATIENT_CLINIC_OR_DEPARTMENT_OTHER): Payer: Commercial Managed Care - PPO

## 2011-08-23 VITALS — BP 103/62 | HR 84 | Temp 98.1°F

## 2011-08-23 DIAGNOSIS — Z5189 Encounter for other specified aftercare: Secondary | ICD-10-CM

## 2011-08-23 DIAGNOSIS — C50912 Malignant neoplasm of unspecified site of left female breast: Secondary | ICD-10-CM

## 2011-08-23 DIAGNOSIS — C50419 Malignant neoplasm of upper-outer quadrant of unspecified female breast: Secondary | ICD-10-CM

## 2011-08-23 MED ORDER — PEGFILGRASTIM INJECTION 6 MG/0.6ML
6.0000 mg | Freq: Once | SUBCUTANEOUS | Status: AC
  Administered 2011-08-23: 6 mg via SUBCUTANEOUS
  Filled 2011-08-23: qty 0.6

## 2011-08-30 ENCOUNTER — Other Ambulatory Visit (HOSPITAL_BASED_OUTPATIENT_CLINIC_OR_DEPARTMENT_OTHER): Payer: Commercial Managed Care - PPO | Admitting: Lab

## 2011-08-30 ENCOUNTER — Other Ambulatory Visit: Payer: Self-pay | Admitting: Oncology

## 2011-08-30 ENCOUNTER — Telehealth: Payer: Self-pay | Admitting: Oncology

## 2011-08-30 ENCOUNTER — Ambulatory Visit (HOSPITAL_BASED_OUTPATIENT_CLINIC_OR_DEPARTMENT_OTHER): Payer: Commercial Managed Care - PPO | Admitting: Physician Assistant

## 2011-08-30 ENCOUNTER — Encounter: Payer: Self-pay | Admitting: Physician Assistant

## 2011-08-30 VITALS — BP 133/76 | HR 85 | Temp 98.8°F | Ht 62.5 in | Wt 105.4 lb

## 2011-08-30 DIAGNOSIS — C50912 Malignant neoplasm of unspecified site of left female breast: Secondary | ICD-10-CM

## 2011-08-30 DIAGNOSIS — Z170 Estrogen receptor positive status [ER+]: Secondary | ICD-10-CM

## 2011-08-30 DIAGNOSIS — Z09 Encounter for follow-up examination after completed treatment for conditions other than malignant neoplasm: Secondary | ICD-10-CM

## 2011-08-30 DIAGNOSIS — C50919 Malignant neoplasm of unspecified site of unspecified female breast: Secondary | ICD-10-CM

## 2011-08-30 LAB — COMPREHENSIVE METABOLIC PANEL
AST: 17 U/L (ref 0–37)
Alkaline Phosphatase: 84 U/L (ref 39–117)
BUN: 7 mg/dL (ref 6–23)
Creatinine, Ser: 0.56 mg/dL (ref 0.50–1.10)

## 2011-08-30 LAB — CBC WITH DIFFERENTIAL/PLATELET
Basophils Absolute: 0 10*3/uL (ref 0.0–0.1)
EOS%: 0.2 % (ref 0.0–7.0)
HCT: 33 % — ABNORMAL LOW (ref 34.8–46.6)
HGB: 11.4 g/dL — ABNORMAL LOW (ref 11.6–15.9)
MCH: 32.5 pg (ref 25.1–34.0)
MCHC: 34.5 g/dL (ref 31.5–36.0)
MCV: 94.3 fL (ref 79.5–101.0)
MONO%: 5.4 % (ref 0.0–14.0)
NEUT%: 85.6 % — ABNORMAL HIGH (ref 38.4–76.8)
RDW: 14.8 % — ABNORMAL HIGH (ref 11.2–14.5)

## 2011-08-30 NOTE — Progress Notes (Signed)
ID: [name]  History of present illness: Beth Davis is a 61 year old [place] woman who underwent diagnostic mammography October of 2010 showing bilateral microcalcifications. This was followed up by breast specific gamma imaging which showed no abnormal isotope activity. Mammography October 2011 was again unremarkable, except for the fact that she does have very dense breasts. An 05/24/2011 however new calcifications were identified in the upper outer quadrant of the left breast. This time breast specific gamma imaging showed increased activity posteriorly in the left breast, near the chest wall. 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 The invasive tumor measured 1 mm, was intermediate grade, and positive for E-cadherin. It was estrogen receptor positive, progesterone receptor and HER-2 negative, with an MIB-1 of 25%.  Bilateral MRIs 05/30/2011 showed vague asymmetric enhancement in the lateral left breast measuring up to 8 mm. There were no other suspicious masses, no axillary or internal mammary adenopathy, and a 7 mm lesion incidentally noted in the liver was felt to be likely a cyst or hemangioma.  The patient was seen in the multidisciplinary breast cancer clinic 06/01/2011, and breast conserving surgery was recommended. However, the patient opted for bilateral mastectomies given her history of dense breasts and the difficulty finding the invasive tumor as detailed above. Accordingly, on 06/14/2011 the patient underwent bilateral mastectomies with left axillary lymph node sampling. The final pathology ([id number]) showed the right mastectomy to be benign, with 0 of 4 lymph nodes sampled. On the left side in addition to ductal carcinoma in situ, there was an 8 mm invasive ductal carcinoma, grade 2, as well as other smaller foci of invasive tumor in different  quadrants. All 7 left axillary lymph nodes were clear. HER-2 was repeated and again was found to be not amplified.  On December 3 of 2012 the patient returned to discuss adjuvant therapy. NCCN guidelines for patients in her situation suggest an Oncotype DX study, and at her prognosis fell in the intermediate category, predicting a 19% risk of recurrence if she took tamoxifen for 5 years. Again NCCN guidelines recommend to consider chemotherapy in this setting. After much discussion the decision was made that the patient would receive 4 cycles of cyclophosphamide and docetaxel, to be followed by a 5-10 years of anti-estrogen therapy.  Interval history:  Beth Davis returns today for assessment of chemotoxicity on day 9 cycle 2 of 4 planned q. three-week doses of docetaxel/cyclophosphamide given in the adjuvant setting for left breast carcinoma. She received Neulasta on day 2 for granulocyte support.  Beth Davis tolerated treatment very well, her primary complaint being the side effects associated with the dexamethasone. He makes her restless, and she has a difficult time sleeping. She tried taking the lorazepam, 0.5 mg at night, with minimal relief. We had discussed a dexamethasone taper, and in fact she "hated the steroid so much" that she took only 3 require days, then stopped.  She has had some fatigue this week, although she is still performing her normal day-to-day activities. She fortunately has had no nausea whatsoever. We changed her nausea medication for metoclopramide to Compazine, and she has had only some occasional diarrhea this past week, which is a big improvement. She's had no signs of peripheral neuropathy and no skin changes, redness, cracking or peeling. She read about taking glutamine powder which she believes helped. She's had no mouth ulcers or oral sensitivity. No excessive tearing. No fevers, chills, or night  sweats.  A detailed review of systems is otherwise noncontributory as noted  below.  Review of Systems: Constitutional:  Fatigue, no weight loss, fever, night sweats Eyes: blurry vision [id number] Cardiovascular: no chest pain or dyspnea on exertion Respiratory: no cough, shortness of breath, or wheezing Neurological: no TIA or stroke symptoms negative Dermatological: negative Gastrointestinal: no abdominal pain, nausea, change in bowel habits, or black or bloody stools Genito-Urinary: no dysuria, trouble voiding, or hematuria Hematological and Lymphatic: negative Breast: negative Musculoskeletal: negative Remaining ROS negative.     Medications: I have reviewed the patient's current medications.  Current Outpatient Prescriptions  Medication Sig Dispense Refill  . cholecalciferol (VITAMIN D) 1000 UNITS tablet Take 1,000 Units by mouth daily.        Marland Kitchen dexamethasone (DECADRON) 4 MG tablet Take 4 mg by mouth as directed. 2 tabs po bid on day before chemo, then x 3 days after chemo      . frovatriptan (FROVA) 2.5 MG tablet Take 2.5 mg by mouth as needed. If recurs, may repeat after 2 hours. Max of 3 tabs in 24 hours.       Marland Kitchen levothyroxine (SYNTHROID, LEVOTHROID) 50 MCG tablet Take 50 mcg by mouth daily.        Marland Kitchen lidocaine-prilocaine (EMLA) cream Apply topically as needed.      Marland Kitchen liothyronine (CYTOMEL) 5 MCG tablet Take 5 mcg by mouth daily.        Marland Kitchen LORazepam (ATIVAN) 0.5 MG tablet Take 0.5 mg by mouth as needed.      . ondansetron (ZOFRAN) 8 MG tablet Take 8 mg by mouth Twice daily as needed.      Marland Kitchen oxyCODONE-acetaminophen (PERCOCET) 5-325 MG per tablet Take 1 tablet by mouth as needed.      . prochlorperazine (COMPAZINE) 10 MG tablet       . promethazine (PHENERGAN) 25 MG tablet       . tobramycin-dexamethasone (TOBRADEX) ophthalmic solution Apply 1 drop to eye Twice daily.      . valACYclovir (VALTREX) 1000 MG tablet Take 1 g by mouth Daily.      . metoCLOPramide (REGLAN) 10 MG tablet Take 10 mg by mouth 4 times daily (before meals and at bedtime).        Family history: The patient's mother is alive at age 55. She had breast cancer diagnosed at the age of 44. The patient has 3 brothers no sisters. The patient's father died at the age of 75. A cousin on her father's side had breast cancer at the age of 44.  Gynecologic history: She had menarche age 25, simple hysterectomy in 2006. She is GX P2, first pregnancy to term age 47. She used birth control pills between 1972 and 1974. She took Clomid for fertility between 1983 and 1986.  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 Her husband [name] (goes by [name]) is a Clinical research associate here in town. Daughter [name] 26 lives in [place]. She works there as a Education officer, community. Dr. [name], 25, is a Gaffer and [employer] in nutrition and diet headaches.   Objective:  Filed Vitals:   08/30/11 0843  BP: 133/76  Pulse: 85  Temp: 98.8 F (37.1 C)   Body mass index is 18.97 kg/(m^2).  HEENT:  Sclerae anicteric. Oropharynx clear.  No mucositis or candidiasis.   Nodes:  No cervical, supraclavicular, or axillary lymphadenopathy palpated.  Breast  Exam:  Deferred. Lungs:  Clear to auscultation bilaterally.  No crackles, rhonchi, or wheezes.   Heart:  Regular rate and rhythm.   Abdomen:  Soft, nontender.  Positive bowel sounds.  No organomegaly or masses palpated.   Musculoskeletal:  No focal spinal tenderness to palpation.  Extremities:  Benign.  No peripheral edema or cyanosis.   Skin:  Benign.   Neuro:  Nonfocal.  Lab Results: baseline CA 27-29 was noninformative at 15  CMP      Component Value Date/Time   NA 142 06/09/2011 1240   K 5.1 06/09/2011 1240   CL 105 06/09/2011 1240   CO2 29 06/09/2011 1240   GLUCOSE 93 06/09/2011 1240   BUN 7 06/09/2011 1240   CREATININE 0.58 06/09/2011 1240   CALCIUM 10.1 06/09/2011 1240   PROT 7.0 06/01/2011 1218   PROT 7.0 06/01/2011 1218    ALBUMIN 4.4 06/01/2011 1218   ALBUMIN 4.4 06/01/2011 1218   AST 16 06/01/2011 1218   AST 16 06/01/2011 1218   ALT 15 06/01/2011 1218   ALT 15 06/01/2011 1218   ALKPHOS 95 06/01/2011 1218   ALKPHOS 95 06/01/2011 1218   BILITOT 0.4 06/01/2011 1218   BILITOT 0.4 06/01/2011 1218   GFRNONAA >90 06/09/2011 1240   GFRAA >90 06/09/2011 1240    CBC Lab Results  Component Value Date   WBC 24.6* 08/30/2011   HGB 11.4* 08/30/2011   HCT 33.0* 08/30/2011   MCV 94.3 08/30/2011   PLT 218 08/30/2011    Studies/Results:  No new studies found.  Assessment: 61 year old [place] woman status post bilateral mastectomies October of 2012 for a left sided invasive ductal carcinoma, T1b N0 or Stage I, estrogen receptor 95% positive, progesterone receptor negative, with no HER-2 amplification and an MIB-1 of 6%; with an Oncotype DX score in the intermediate range, predicting a 19% risk of recurrence with 5 years of tamoxifen.    Currently being treated in the adjuvant setting with docetaxel/cyclophosphamide given every 3 weeks. Neulasta is given on day 2 for granulocyte support.    Plan:  Beth Davis has tolerated this second cycle of chemotherapy well. She scheduled to see me in 2 weeks on January 28 in anticipation of her third dose of chemotherapy. She is already scheduled through mid February when she will complete all 4 doses of chemotherapy, and we'll see Dr. Darnelle Catalan in late February to discuss her long-term followup plan.   The plan has been reviewed with the patient who voices understanding and agreement. She will call with any changes or problems.     Beth Roen, PA-C 08/30/2011  Will see she agrees that no

## 2011-08-30 NOTE — Telephone Encounter (Signed)
gve the pt her revised feb 2013 appt calendar

## 2011-09-12 ENCOUNTER — Encounter: Payer: Self-pay | Admitting: Physician Assistant

## 2011-09-12 ENCOUNTER — Other Ambulatory Visit (HOSPITAL_BASED_OUTPATIENT_CLINIC_OR_DEPARTMENT_OTHER): Payer: Commercial Managed Care - PPO | Admitting: Lab

## 2011-09-12 ENCOUNTER — Ambulatory Visit (HOSPITAL_BASED_OUTPATIENT_CLINIC_OR_DEPARTMENT_OTHER): Payer: Commercial Managed Care - PPO

## 2011-09-12 ENCOUNTER — Ambulatory Visit: Payer: Commercial Managed Care - PPO | Admitting: Physician Assistant

## 2011-09-12 VITALS — BP 122/78 | HR 92 | Temp 98.6°F | Ht 62.5 in | Wt 109.4 lb

## 2011-09-12 DIAGNOSIS — C50919 Malignant neoplasm of unspecified site of unspecified female breast: Secondary | ICD-10-CM

## 2011-09-12 DIAGNOSIS — Z170 Estrogen receptor positive status [ER+]: Secondary | ICD-10-CM

## 2011-09-12 DIAGNOSIS — C50912 Malignant neoplasm of unspecified site of left female breast: Secondary | ICD-10-CM

## 2011-09-12 DIAGNOSIS — Z5111 Encounter for antineoplastic chemotherapy: Secondary | ICD-10-CM

## 2011-09-12 LAB — CBC WITH DIFFERENTIAL/PLATELET
Basophils Absolute: 0 10*3/uL (ref 0.0–0.1)
EOS%: 0 % (ref 0.0–7.0)
HCT: 33.2 % — ABNORMAL LOW (ref 34.8–46.6)
HGB: 11.2 g/dL — ABNORMAL LOW (ref 11.6–15.9)
LYMPH%: 6.6 % — ABNORMAL LOW (ref 14.0–49.7)
MCH: 31.7 pg (ref 25.1–34.0)
MCV: 94.1 fL (ref 79.5–101.0)
MONO%: 8.4 % (ref 0.0–14.0)
NEUT%: 84.9 % — ABNORMAL HIGH (ref 38.4–76.8)
Platelets: 264 10*3/uL (ref 145–400)
lymph#: 0.9 10*3/uL (ref 0.9–3.3)

## 2011-09-12 MED ORDER — ONDANSETRON 16 MG/50ML IVPB (CHCC)
16.0000 mg | Freq: Once | INTRAVENOUS | Status: AC
  Administered 2011-09-12: 16 mg via INTRAVENOUS

## 2011-09-12 MED ORDER — SODIUM CHLORIDE 0.9 % IV SOLN
600.0000 mg/m2 | Freq: Once | INTRAVENOUS | Status: AC
  Administered 2011-09-12: 880 mg via INTRAVENOUS
  Filled 2011-09-12: qty 44

## 2011-09-12 MED ORDER — SODIUM CHLORIDE 0.9 % IJ SOLN
10.0000 mL | INTRAMUSCULAR | Status: DC | PRN
  Administered 2011-09-12: 10 mL
  Filled 2011-09-12: qty 10

## 2011-09-12 MED ORDER — DEXAMETHASONE SODIUM PHOSPHATE 4 MG/ML IJ SOLN
20.0000 mg | Freq: Once | INTRAMUSCULAR | Status: AC
  Administered 2011-09-12: 20 mg via INTRAVENOUS

## 2011-09-12 MED ORDER — SODIUM CHLORIDE 0.9 % IV SOLN
Freq: Once | INTRAVENOUS | Status: AC
  Administered 2011-09-12: 13:00:00 via INTRAVENOUS

## 2011-09-12 MED ORDER — HEPARIN SOD (PORK) LOCK FLUSH 100 UNIT/ML IV SOLN
500.0000 [IU] | Freq: Once | INTRAVENOUS | Status: AC | PRN
  Administered 2011-09-12: 500 [IU]
  Filled 2011-09-12: qty 5

## 2011-09-12 MED ORDER — DOCETAXEL CHEMO INJECTION 160 MG/16ML
75.0000 mg/m2 | Freq: Once | INTRAVENOUS | Status: AC
  Administered 2011-09-12: 110 mg via INTRAVENOUS
  Filled 2011-09-12: qty 11

## 2011-09-12 NOTE — Progress Notes (Signed)
ID: Beth Davis  History of present illness: Beth Davis is a 61 year old [place] woman who underwent diagnostic mammography October of 2010 showing bilateral microcalcifications. This was followed up by breast specific gamma imaging which showed no abnormal isotope activity. Mammography October 2011 was again unremarkable, except for the fact that she does have very dense breasts. An 05/24/2011 however new calcifications were identified in the upper outer quadrant of the left breast. This time breast specific gamma imaging showed increased activity posteriorly in the left breast, near the chest wall. 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 The invasive tumor measured 1 mm, was intermediate grade, and positive for E-cadherin. It was estrogen receptor positive, progesterone receptor and HER-2 negative, with an MIB-1 of 25%.  Bilateral MRIs 05/30/2011 showed vague asymmetric enhancement in the lateral left breast measuring up to 8 mm. There were no other suspicious masses, no axillary or internal mammary adenopathy, and a 7 mm lesion incidentally noted in the liver was felt to be likely a cyst or hemangioma.  The patient was seen in the multidisciplinary breast cancer clinic 06/01/2011, and breast conserving surgery was recommended. However, the patient opted for bilateral mastectomies given her history of dense breasts and the difficulty finding the invasive tumor as detailed above. Accordingly, on 06/14/2011 the patient underwent bilateral mastectomies with left axillary lymph node sampling. The final pathology ([id number]) showed the right mastectomy to be benign, with 0 of 4 lymph nodes sampled. On the left side in addition to ductal carcinoma in situ, there was an 8 mm invasive ductal carcinoma, grade 2, as well as other smaller foci of invasive tumor in different  quadrants. All 7 left axillary lymph nodes were clear. HER-2 was repeated and again was found to be not amplified.  On December 3 of 2012 the patient returned to discuss adjuvant therapy. NCCN guidelines for patients in her situation suggest an Oncotype DX study, and at her prognosis fell in the intermediate category, predicting a 19% risk of recurrence if she took tamoxifen for 5 years. Again NCCN guidelines recommend to consider chemotherapy in this setting. After much discussion the decision was made that the patient would receive 4 cycles of cyclophosphamide and docetaxel, to be followed by a 5-10 years of anti-estrogen therapy.  Interval history:  Beth Davis returns today for followup of her left breast carcinoma, due for day 1 cycle 3 of 4 planned q. three-week doses of docetaxel/cyclophosphamide given in the adjuvant setting. She receives Neulasta on day 2 for granulocyte support.  Beth Davis continues to tolerate treatment well. Her biggest complaint today is fatigue. She tells me, however, that the last 6 or 7 days have been "good". She has noticed some slight numbness in the very tips of her fingers alone. This is not interfering with any of her day-to-day activities. She can still perform fine motor skills, such as buttoning a blouse or picking up small items from a flat surface. She does have an area of the very tips of her fingers where the skin peeled initially, and this area almost feels a little calloused. This is where she feels the decreased sensation. She's had no tingling. No signs of neuropathy noted in the lower extremities. Beth Davis is eating well with no nausea. No mouth ulcers or oral sensitivity. No change in bowel habits. No fevers.  A detailed review of systems is otherwise noncontributory as noted below.  Review of Systems:  Constitutional:  Fatigue, no weight loss, fever, night sweats Eyes: blurry vision [id number] Cardiovascular: no chest pain or dyspnea on exertion Respiratory: no  cough, shortness of breath, or wheezing Neurological: no TIA or stroke symptoms Slight decrease in sensation in fingertips. Dermatological: negative Gastrointestinal: no abdominal pain, nausea, change in bowel habits, or black or bloody stools Genito-Urinary: no dysuria, trouble voiding, or hematuria Hematological and Lymphatic: negative Breast: negative Musculoskeletal: negative Remaining ROS negative.     Medications: I have reviewed the patient's current medications.  Current Outpatient Prescriptions  Medication Sig Dispense Refill  . budesonide (ENTOCORT EC) 3 MG 24 hr capsule       . cholecalciferol (VITAMIN D) 1000 UNITS tablet Take 1,000 Units by mouth daily.        Marland Kitchen dexamethasone (DECADRON) 4 MG tablet Take 4 mg by mouth as directed. 2 tabs po bid on day before chemo, then x 3 days after chemo      . frovatriptan (FROVA) 2.5 MG tablet Take 2.5 mg by mouth as needed. If recurs, may repeat after 2 hours. Max of 3 tabs in 24 hours.       Marland Kitchen levothyroxine (SYNTHROID, LEVOTHROID) 50 MCG tablet Take 50 mcg by mouth daily.        Marland Kitchen lidocaine-prilocaine (EMLA) cream Apply topically as needed.      Marland Kitchen liothyronine (CYTOMEL) 5 MCG tablet Take 5 mcg by mouth daily.        Marland Kitchen LORazepam (ATIVAN) 0.5 MG tablet Take 0.5 mg by mouth as needed.      . ondansetron (ZOFRAN) 8 MG tablet Take 8 mg by mouth Twice daily as needed.      Marland Kitchen oxyCODONE-acetaminophen (PERCOCET) 5-325 MG per tablet Take 1 tablet by mouth as needed.      . prochlorperazine (COMPAZINE) 10 MG tablet       . tobramycin-dexamethasone (TOBRADEX) ophthalmic solution Apply 1 drop to eye Twice daily.      . valACYclovir (VALTREX) 1000 MG tablet Take 1 g by mouth Daily.      . metoCLOPramide (REGLAN) 10 MG tablet Take 10 mg by mouth 4 times daily (before meals and at bedtime).      . promethazine (PHENERGAN) 25 MG tablet        Family history: The patient's mother is alive at age 28. She had breast cancer diagnosed at the age of 75.  The patient has 3 brothers no sisters. The patient's father died at the age of 51. A cousin on her father's side had breast cancer at the age of 14.  Gynecologic history: She had menarche age 49, simple hysterectomy in 2006. She is GX P2, first pregnancy to term age 78. She used birth control pills between 1972 and 1974. She took Clomid for fertility between 1983 and 1986.  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 Her husband [name] (goes by [name]) is a Clinical research associate here in town. Daughter [name] 26 lives in [place]. She works there as a Education officer, community. Dr. [name], 25, is a Gaffer and [employer] in nutrition and diet headaches.   Objective:  Filed Vitals:   09/12/11 1159  BP: 122/78  Pulse: 92  Temp: 98.6 F (37 C)   Body mass index is 19.69 kg/(m^2).  HEENT:  Sclerae anicteric. Oropharynx clear.  No mucositis or candidiasis.   Nodes:  No cervical, supraclavicular, or axillary lymphadenopathy  palpated.  Breast Exam:  Deferred. Lungs:  Clear to auscultation bilaterally.  No crackles, rhonchi, or wheezes.   Heart:  Regular rate and rhythm.   Abdomen:  Soft, nontender.  Positive bowel sounds.  No organomegaly or masses palpated.   Musculoskeletal:  No focal spinal tenderness to palpation.  Extremities:  Benign.  No peripheral edema or cyanosis.   Skin:  Benign.   Neuro:  Nonfocal.  Lab Results: baseline CA 27-29 was noninformative at 15  CMP      Component Value Date/Time   NA 141 08/30/2011 0828   K 3.5 08/30/2011 0828   CL 103 08/30/2011 0828   CO2 28 08/30/2011 0828   GLUCOSE 92 08/30/2011 0828   BUN 7 08/30/2011 0828   CREATININE 0.56 08/30/2011 0828   CALCIUM 9.3 08/30/2011 0828   PROT 5.5* 08/30/2011 0828   ALBUMIN 3.8 08/30/2011 0828   AST 17 08/30/2011 0828   ALT 20 08/30/2011 0828   ALKPHOS 84 08/30/2011 0828   BILITOT 0.3 08/30/2011 0828   GFRNONAA >90  06/09/2011 1240   GFRAA >90 06/09/2011 1240    CBC Lab Results  Component Value Date   WBC 13.3* 09/12/2011   HGB 11.2* 09/12/2011   HCT 33.2* 09/12/2011   MCV 94.1 09/12/2011   PLT 264 09/12/2011    Studies/Results:  No new studies found.  Assessment: 61 year old [place] woman status post bilateral mastectomies October of 2012 for a left sided invasive ductal carcinoma, T1b N0 or Stage I, estrogen receptor 95% positive, progesterone receptor negative, with no HER-2 amplification and an MIB-1 of 6%; with an Oncotype DX score in the intermediate range, predicting a 19% risk of recurrence with 5 years of tamoxifen.    Currently being treated in the adjuvant setting with docetaxel/cyclophosphamide given every 3 weeks. Neulasta is given on day 2 for granulocyte support.    Plan:  Beth Davis will proceed to treatment today as scheduled for her third cycle of adjuvant docetaxel/cyclophosphamide. She will return tomorrow for her Neulasta injection, and I will see her next week on February 5 for assessment of chemotoxicity.  I will mention that Beth Davis has been doing a lot of reading online, and is questioning whether there is possibility of proceeding with 6 total cycles of chemotherapy, rather than the original plan of 4. I will review this with Dr. Darnelle Catalan, and we will discuss this again next week at [facility] followup appointment.   The plan has been reviewed with the patient who voices understanding and agreement. She will call with any changes or problems.     Beonca Gibb, PA-C 09/12/2011

## 2011-09-13 ENCOUNTER — Ambulatory Visit (HOSPITAL_BASED_OUTPATIENT_CLINIC_OR_DEPARTMENT_OTHER): Payer: Commercial Managed Care - PPO

## 2011-09-13 VITALS — BP 96/56 | HR 71 | Temp 98.7°F

## 2011-09-13 DIAGNOSIS — Z5112 Encounter for antineoplastic immunotherapy: Secondary | ICD-10-CM

## 2011-09-13 DIAGNOSIS — C50912 Malignant neoplasm of unspecified site of left female breast: Secondary | ICD-10-CM

## 2011-09-13 DIAGNOSIS — C50919 Malignant neoplasm of unspecified site of unspecified female breast: Secondary | ICD-10-CM

## 2011-09-13 MED ORDER — PEGFILGRASTIM INJECTION 6 MG/0.6ML
6.0000 mg | Freq: Once | SUBCUTANEOUS | Status: AC
  Administered 2011-09-13: 6 mg via SUBCUTANEOUS
  Filled 2011-09-13: qty 0.6

## 2011-09-20 ENCOUNTER — Other Ambulatory Visit (HOSPITAL_BASED_OUTPATIENT_CLINIC_OR_DEPARTMENT_OTHER): Payer: Commercial Managed Care - PPO | Admitting: Lab

## 2011-09-20 ENCOUNTER — Other Ambulatory Visit: Payer: Self-pay | Admitting: *Deleted

## 2011-09-20 ENCOUNTER — Encounter: Payer: Self-pay | Admitting: Physician Assistant

## 2011-09-20 ENCOUNTER — Ambulatory Visit (HOSPITAL_BASED_OUTPATIENT_CLINIC_OR_DEPARTMENT_OTHER): Payer: Commercial Managed Care - PPO | Admitting: Physician Assistant

## 2011-09-20 VITALS — BP 114/73 | HR 89 | Temp 98.6°F | Ht 62.5 in | Wt 108.1 lb

## 2011-09-20 DIAGNOSIS — C50912 Malignant neoplasm of unspecified site of left female breast: Secondary | ICD-10-CM

## 2011-09-20 DIAGNOSIS — Z170 Estrogen receptor positive status [ER+]: Secondary | ICD-10-CM

## 2011-09-20 DIAGNOSIS — G609 Hereditary and idiopathic neuropathy, unspecified: Secondary | ICD-10-CM

## 2011-09-20 DIAGNOSIS — C50919 Malignant neoplasm of unspecified site of unspecified female breast: Secondary | ICD-10-CM

## 2011-09-20 DIAGNOSIS — Z09 Encounter for follow-up examination after completed treatment for conditions other than malignant neoplasm: Secondary | ICD-10-CM

## 2011-09-20 LAB — COMPREHENSIVE METABOLIC PANEL
CO2: 30 mEq/L (ref 19–32)
Calcium: 9.6 mg/dL (ref 8.4–10.5)
Chloride: 102 mEq/L (ref 96–112)
Glucose, Bld: 83 mg/dL (ref 70–99)
Sodium: 141 mEq/L (ref 135–145)
Total Bilirubin: 0.2 mg/dL — ABNORMAL LOW (ref 0.3–1.2)
Total Protein: 6.4 g/dL (ref 6.0–8.3)

## 2011-09-20 LAB — CBC WITH DIFFERENTIAL/PLATELET
Eosinophils Absolute: 0 10*3/uL (ref 0.0–0.5)
HCT: 35.1 % (ref 34.8–46.6)
LYMPH%: 7.1 % — ABNORMAL LOW (ref 14.0–49.7)
MONO#: 1.3 10*3/uL — ABNORMAL HIGH (ref 0.1–0.9)
NEUT#: 16.6 10*3/uL — ABNORMAL HIGH (ref 1.5–6.5)
NEUT%: 85.6 % — ABNORMAL HIGH (ref 38.4–76.8)
Platelets: 239 10*3/uL (ref 145–400)
RBC: 3.62 10*6/uL — ABNORMAL LOW (ref 3.70–5.45)
WBC: 19.4 10*3/uL — ABNORMAL HIGH (ref 3.9–10.3)

## 2011-09-20 MED ORDER — FLUCONAZOLE 100 MG PO TABS: 100.0000 mg | ORAL_TABLET | Freq: Every day | ORAL | Status: AC

## 2011-09-20 NOTE — Progress Notes (Signed)
ID: [name]  History of present illness: Beth Davis is a 61 year old [place] woman who underwent diagnostic mammography October of 2010 showing bilateral microcalcifications. This was followed up by breast specific gamma imaging which showed no abnormal isotope activity. Mammography October 2011 was again unremarkable, except for the fact that she does have very dense breasts. An 05/24/2011 however new calcifications were identified in the upper outer quadrant of the left breast. This time breast specific gamma imaging showed increased activity posteriorly in the left breast, near the chest wall. 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 The invasive tumor measured 1 mm, was intermediate grade, and positive for E-cadherin. It was estrogen receptor positive, progesterone receptor and HER-2 negative, with an MIB-1 of 25%.  Bilateral MRIs 05/30/2011 showed vague asymmetric enhancement in the lateral left breast measuring up to 8 mm. There were no other suspicious masses, no axillary or internal mammary adenopathy, and a 7 mm lesion incidentally noted in the liver was felt to be likely a cyst or hemangioma.  The patient was seen in the multidisciplinary breast cancer clinic 06/01/2011, and breast conserving surgery was recommended. However, the patient opted for bilateral mastectomies given her history of dense breasts and the difficulty finding the invasive tumor as detailed above. Accordingly, on 06/14/2011 the patient underwent bilateral mastectomies with left axillary lymph node sampling. The final pathology ([id number]) showed the right mastectomy to be benign, with 0 of 4 lymph nodes sampled. On the left side in addition to ductal carcinoma in situ, there was an 8 mm invasive ductal carcinoma, grade 2, as well as other smaller foci of invasive tumor in different  quadrants. All 7 left axillary lymph nodes were clear. HER-2 was repeated and again was found to be not amplified.  On December 3 of 2012 the patient returned to discuss adjuvant therapy. NCCN guidelines for patients in her situation suggest an Oncotype DX study, and at her prognosis fell in the intermediate category, predicting a 19% risk of recurrence if she took tamoxifen for 5 years. Again NCCN guidelines recommend to consider chemotherapy in this setting. After much discussion the decision was made that the patient would receive 4 cycles of cyclophosphamide and docetaxel, to be followed by a 5-10 years of anti-estrogen therapy.  Interval history:  Beth Davis returns today for followup of her left breast carcinoma, currently day 9, cycle 3 of 4 planned q. three-week doses of docetaxel/cyclophosphamide given in the adjuvant setting. She receives Neulasta on day 2 for granulocyte support.  Beth Davis has been more fatigued following cycle 3. It is taken her a little longer to recover, and she spent the first few days in bed. She's also had increased signs of peripheral neuropathy. Her fingertips have been extremely sensitive, almost painful, although this is slightly better today. She tells me she is actually been dropping "little stuff", but was able to button her blouse and the necklace this morning. The right fingers seem to be affected little more so than the left. The feet have been spared. Her fingernails are also little tender and sore. She's had no drainage or evidence of infection.  Kathy's mouth has also been sore, especially her tongue. She's had taste aversion, and can only tastes salty foods. She's had no actual ulcerations. She is trying to eat, and keep herself well hydrated. Fortunately, she denies any nausea or emesis and has had no change in bowel habits.  A detailed review of systems is otherwise noncontributory as noted below.  Review of Systems: Constitutional:  Fatigue, no weight loss,  fever, night sweats Eyes: blurry vision WUJ:WJXB sensitivity, no ulcerations Cardiovascular: no chest pain or dyspnea on exertion Respiratory: no cough, shortness of breath, or wheezing Neurological: no TIA or stroke symptoms Slight decrease in sensation in fingertips. Dermatological: nail changes - hands Gastrointestinal: no abdominal pain, nausea, change in bowel habits, or black or bloody stools Genito-Urinary: no dysuria, trouble voiding, or hematuria Hematological and Lymphatic: negative Breast: negative Musculoskeletal: negative Remaining ROS negative.   Medications: I have reviewed the patient's current medications.  Current Outpatient Prescriptions  Medication Sig Dispense Refill  . b complex vitamins tablet Take 1 tablet by mouth daily.      . budesonide (ENTOCORT EC) 3 MG 24 hr capsule       . cholecalciferol (VITAMIN D) 1000 UNITS tablet Take 1,000 Units by mouth daily.        Marland Kitchen dexamethasone (DECADRON) 4 MG tablet Take 4 mg by mouth as directed. 2 tabs po bid on day before chemo, then x 3 days after chemo      . fluconazole (DIFLUCAN) 100 MG tablet Take 1 tablet (100 mg total) by mouth daily.  7 tablet  1  . frovatriptan (FROVA) 2.5 MG tablet Take 2.5 mg by mouth as needed. If recurs, may repeat after 2 hours. Max of 3 tabs in 24 hours.       Marland Kitchen levothyroxine (SYNTHROID, LEVOTHROID) 50 MCG tablet Take 50 mcg by mouth daily.        Marland Kitchen lidocaine-prilocaine (EMLA) cream Apply topically as needed.      Marland Kitchen liothyronine (CYTOMEL) 5 MCG tablet Take 5 mcg by mouth daily.        Marland Kitchen LORazepam (ATIVAN) 0.5 MG tablet Take 0.5 mg by mouth as needed.      . metoCLOPramide (REGLAN) 10 MG tablet Take 10 mg by mouth 4 times daily (before meals and at bedtime).      . ondansetron (ZOFRAN) 8 MG tablet Take 8 mg by mouth Twice daily as needed.      . promethazine (PHENERGAN) 25 MG tablet       . tobramycin-dexamethasone (TOBRADEX) ophthalmic solution Apply 1 drop to eye Twice daily.      .  valACYclovir (VALTREX) 1000 MG tablet Take 1 g by mouth Daily.      Marland Kitchen oxyCODONE-acetaminophen (PERCOCET) 5-325 MG per tablet Take 1 tablet by mouth as needed.      . prochlorperazine (COMPAZINE) 10 MG tablet        Family history: The patient's mother is alive at age 3. She had breast cancer diagnosed at the age of 42. The patient has 3 brothers no sisters. The patient's father died at the age of 10. A cousin on her father's side had breast cancer at the age of 36.  Gynecologic history: She had menarche age 11, simple hysterectomy in 2006. She is GX P2, first pregnancy to term age 72. She used birth control pills between 1972 and 1974. She took Clomid for fertility between 1983 and 1986.  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 Her husband [name] (goes by [name]) is a Clinical research associate here in town. Daughter [name] 26 lives in [place]. She works there as a Education officer, community. Dr. [name], 25, is a Gaffer and [place] in  nutrition and diet headaches.   Objective:  Filed Vitals:   09/20/11 1044  BP: 114/73  Pulse: 89  Temp: 98.6 F (37 C)   Body mass index is 19.46 kg/(m^2).  HEENT:  Sclerae anicteric. Oropharynx shows a white coating in the roof of the mouth and the posterior buccal mucosa. No ulcerations are noted.  Nodes:  No cervical, supraclavicular, or axillary lymphadenopathy palpated.  Breast Exam:  Deferred. Lungs:  Clear to auscultation bilaterally.  No crackles, rhonchi, or wheezes.   Heart:  Regular rate and rhythm.  No gallops, murmurs, or rubs. Abdomen:  Soft, nontender.  Positive bowel sounds.  No organomegaly or masses palpated.   Musculoskeletal:  No focal spinal tenderness to palpation.  Extremities:  Benign.  No peripheral edema or cyanosis.   Skin:  Benign with the exception of nail dyscrasia bilaterally on the upper extremities. No drainage or evidence of  infection. Neuro:  Nonfocal.  Lab Results: baseline CA 27-29 was noninformative at 15  CMP      Component Value Date/Time   NA 141 08/30/2011 0828   K 3.5 08/30/2011 0828   CL 103 08/30/2011 0828   CO2 28 08/30/2011 0828   GLUCOSE 92 08/30/2011 0828   BUN 7 08/30/2011 0828   CREATININE 0.56 08/30/2011 0828   CALCIUM 9.3 08/30/2011 0828   PROT 5.5* 08/30/2011 0828   ALBUMIN 3.8 08/30/2011 0828   AST 17 08/30/2011 0828   ALT 20 08/30/2011 0828   ALKPHOS 84 08/30/2011 0828   BILITOT 0.3 08/30/2011 0828   GFRNONAA >90 06/09/2011 1240   GFRAA >90 06/09/2011 1240    CBC Lab Results  Component Value Date   WBC 19.4* 09/20/2011   HGB 11.9 09/20/2011   HCT 35.1 09/20/2011   MCV 97.1 09/20/2011   PLT 239 09/20/2011    Studies/Results:  No new studies found.  Assessment: 61 year old [place] woman status post bilateral mastectomies October of 2012 for a left sided invasive ductal carcinoma, T1b N0 or Stage I, estrogen receptor 95% positive, progesterone receptor negative, with no HER-2 amplification and an MIB-1 of 6%; with an Oncotype DX score in the intermediate range, predicting a 19% risk of recurrence with 5 years of tamoxifen.    Currently being treated in the adjuvant setting with docetaxel/cyclophosphamide given every 3 weeks, the goal being to complete a total of 4 cycles. Neulasta is given on day 2 for granulocyte support.    Plan:  Overall Beth Davis is recovering well from cycle 3, although I am a little concerned about the increase in peripheral neuropathy since her treatment last week. We will follow this very closely for the next couple weeks and will reassess when I see her on February 18, prior to her fourth and final dose of docetaxel/cyclophosphamide. I have suggested that she begin on a vitamin B complex daily.  I am also prescribing Diflucan daily for the next 7 days for oral candidiasis. She'll also try rinsing with mild salt water or baking soda solution a couple times daily.    The plan has been reviewed with the patient who voices understanding and agreement. She will call with any changes or problems.     Cuca Benassi, PA-C 09/20/2011

## 2011-10-03 ENCOUNTER — Encounter: Payer: Self-pay | Admitting: Physician Assistant

## 2011-10-03 ENCOUNTER — Ambulatory Visit (HOSPITAL_BASED_OUTPATIENT_CLINIC_OR_DEPARTMENT_OTHER): Payer: Commercial Managed Care - PPO

## 2011-10-03 ENCOUNTER — Other Ambulatory Visit: Payer: Self-pay | Admitting: Oncology

## 2011-10-03 ENCOUNTER — Other Ambulatory Visit: Payer: Commercial Managed Care - PPO

## 2011-10-03 ENCOUNTER — Ambulatory Visit (HOSPITAL_BASED_OUTPATIENT_CLINIC_OR_DEPARTMENT_OTHER): Payer: Commercial Managed Care - PPO | Admitting: Physician Assistant

## 2011-10-03 ENCOUNTER — Telehealth: Payer: Self-pay | Admitting: *Deleted

## 2011-10-03 VITALS — BP 103/64 | HR 96 | Temp 98.3°F | Ht 62.5 in | Wt 111.9 lb

## 2011-10-03 DIAGNOSIS — C50919 Malignant neoplasm of unspecified site of unspecified female breast: Secondary | ICD-10-CM

## 2011-10-03 DIAGNOSIS — C50912 Malignant neoplasm of unspecified site of left female breast: Secondary | ICD-10-CM

## 2011-10-03 DIAGNOSIS — Z5111 Encounter for antineoplastic chemotherapy: Secondary | ICD-10-CM

## 2011-10-03 LAB — COMPREHENSIVE METABOLIC PANEL
ALT: 19 U/L (ref 0–35)
Albumin: 4 g/dL (ref 3.5–5.2)
CO2: 26 mEq/L (ref 19–32)
Chloride: 105 mEq/L (ref 96–112)
Glucose, Bld: 91 mg/dL (ref 70–99)
Potassium: 4 mEq/L (ref 3.5–5.3)
Sodium: 140 mEq/L (ref 135–145)
Total Protein: 6.1 g/dL (ref 6.0–8.3)

## 2011-10-03 LAB — CBC WITH DIFFERENTIAL/PLATELET
BASO%: 0.4 % (ref 0.0–2.0)
Eosinophils Absolute: 0 10*3/uL (ref 0.0–0.5)
LYMPH%: 4.2 % — ABNORMAL LOW (ref 14.0–49.7)
MONO#: 0.8 10*3/uL (ref 0.1–0.9)
NEUT#: 12.5 10*3/uL — ABNORMAL HIGH (ref 1.5–6.5)
Platelets: 273 10*3/uL (ref 145–400)
RBC: 3.35 10*6/uL — ABNORMAL LOW (ref 3.70–5.45)
RDW: 18.5 % — ABNORMAL HIGH (ref 11.2–14.5)
WBC: 14 10*3/uL — ABNORMAL HIGH (ref 3.9–10.3)
lymph#: 0.6 10*3/uL — ABNORMAL LOW (ref 0.9–3.3)

## 2011-10-03 MED ORDER — SODIUM CHLORIDE 0.9 % IV SOLN
600.0000 mg/m2 | Freq: Once | INTRAVENOUS | Status: AC
  Administered 2011-10-03: 880 mg via INTRAVENOUS
  Filled 2011-10-03: qty 44

## 2011-10-03 MED ORDER — DEXAMETHASONE SODIUM PHOSPHATE 4 MG/ML IJ SOLN
20.0000 mg | Freq: Once | INTRAMUSCULAR | Status: AC
  Administered 2011-10-03: 20 mg via INTRAVENOUS

## 2011-10-03 MED ORDER — TOBRAMYCIN-DEXAMETHASONE 0.3-0.1 % OP SUSP: 1.0000 [drp] | Freq: Two times a day (BID) | OPHTHALMIC | Status: DC

## 2011-10-03 MED ORDER — HEPARIN SOD (PORK) LOCK FLUSH 100 UNIT/ML IV SOLN
500.0000 [IU] | Freq: Once | INTRAVENOUS | Status: AC | PRN
  Administered 2011-10-03: 500 [IU]
  Filled 2011-10-03: qty 5

## 2011-10-03 MED ORDER — ONDANSETRON 16 MG/50ML IVPB (CHCC)
16.0000 mg | Freq: Once | INTRAVENOUS | Status: AC
  Administered 2011-10-03: 16 mg via INTRAVENOUS

## 2011-10-03 MED ORDER — DOCETAXEL CHEMO INJECTION 160 MG/16ML
75.0000 mg/m2 | Freq: Once | INTRAVENOUS | Status: AC
  Administered 2011-10-03: 110 mg via INTRAVENOUS
  Filled 2011-10-03: qty 11

## 2011-10-03 MED ORDER — SODIUM CHLORIDE 0.9 % IJ SOLN
10.0000 mL | INTRAMUSCULAR | Status: DC | PRN
  Administered 2011-10-03: 10 mL
  Filled 2011-10-03: qty 10

## 2011-10-03 MED ORDER — SODIUM CHLORIDE 0.9 % IV SOLN
Freq: Once | INTRAVENOUS | Status: AC
  Administered 2011-10-03: 14:00:00 via INTRAVENOUS

## 2011-10-03 NOTE — Progress Notes (Signed)
ID: [name]  History of present illness: Beth Davis is a 61 year old [place] woman who underwent diagnostic mammography October of 2010 showing bilateral microcalcifications. This was followed up by breast specific gamma imaging which showed no abnormal isotope activity. Mammography October 2011 was again unremarkable, except for the fact that she does have very dense breasts. An 05/24/2011 however new calcifications were identified in the upper outer quadrant of the left breast. This time breast specific gamma imaging showed increased activity posteriorly in the left breast, near the chest wall. 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 The invasive tumor measured 1 mm, was intermediate grade, and positive for E-cadherin. It was estrogen receptor positive, progesterone receptor and HER-2 negative, with an MIB-1 of 25%.  Bilateral MRIs 05/30/2011 showed vague asymmetric enhancement in the lateral left breast measuring up to 8 mm. There were no other suspicious masses, no axillary or internal mammary adenopathy, and a 7 mm lesion incidentally noted in the liver was felt to be likely a cyst or hemangioma.  The patient was seen in the multidisciplinary breast cancer clinic 06/01/2011, and breast conserving surgery was recommended. However, the patient opted for bilateral mastectomies given her history of dense breasts and the difficulty finding the invasive tumor as detailed above. Accordingly, on 06/14/2011 the patient underwent bilateral mastectomies with left axillary lymph node sampling. The final pathology ([id number]) showed the right mastectomy to be benign, with 0 of 4 lymph nodes sampled. On the left side in addition to ductal carcinoma in situ, there was an 8 mm invasive ductal carcinoma, grade 2, as well as other smaller foci of invasive tumor in different  quadrants. All 7 left axillary lymph nodes were clear. HER-2 was repeated and again was found to be not amplified.  On December 3 of 2012 the patient returned to discuss adjuvant therapy. NCCN guidelines for patients in her situation suggest an Oncotype DX study, and at her prognosis fell in the intermediate category, predicting a 19% risk of recurrence if she took tamoxifen for 5 years. Again NCCN guidelines recommend to consider chemotherapy in this setting. After much discussion the decision was made that the patient would receive 4 cycles of cyclophosphamide and docetaxel, to be followed by a 5-10 years of anti-estrogen therapy.  Interval history:  Beth Davis returns today for followup of her left breast carcinoma, currently day 1, cycle 4 of 4 planned q. three-week doses of docetaxel/cyclophosphamide given in the adjuvant setting. She receives Neulasta on day 2 for granulocyte support.  Beth Davis is feeling well today, "just in  time for another treatment" she says. Her energy level has improved. She denies any signs of neuropathy in either the upper or lower extremities today. This was of concern I saw HER-2 weeks ago. She tells me that since that visit, the skin on the tips of her fingers peeled, and she has full feeling in her fingertips again. There still a little dryness, a little peeling on the hands, but no cracking. The feet are benign with no skin changes. She continues to have some nail dyscrasia on the upper extremities, with some slight sensitivity in the nailbeds. A couple of the fingernails are loosening from the nail beds, but there is no evidence of drainage or infection.   Beth Davis is eating and drinking well with no nausea or emesis. No oral sensitivity, and no ulcers. She's rinsing with baking soda solution, and completed  a course of Diflucan for oral thrush which has resolved. She continues to use TobraDex eyedrops has had no excessive tearing. No fevers, chills, or night sweats.  A detailed  review of systems is otherwise noncontributory as noted below.  Review of Systems: Constitutional:  , no weight loss, fever, night sweats and feels well Eyes: negative,  ENT: no oral sensitivity, no ulcerations Cardiovascular: no chest pain or dyspnea on exertion Respiratory: no cough, shortness of breath, or wheezing Neurological: no TIA or stroke symptoms, no numbness or tingling Dermatological: nail changes - hands; dry skin with some peeling on fingertips Gastrointestinal: no abdominal pain, nausea, change in bowel habits, or black or bloody stools Genito-Urinary: no dysuria, trouble voiding, or hematuria Hematological and Lymphatic: negative Breast: negative Musculoskeletal: negative Remaining ROS negative.   Medications: I have reviewed the patient's current medications.  Current Outpatient Prescriptions  Medication Sig Dispense Refill  . b complex vitamins tablet Take 1 tablet by mouth daily.      . budesonide (ENTOCORT EC) 3 MG 24 hr capsule       . cholecalciferol (VITAMIN D) 1000 UNITS tablet Take 1,000 Units by mouth daily.        Marland Kitchen dexamethasone (DECADRON) 4 MG tablet Take 4 mg by mouth as directed. 2 tabs po bid on day before chemo, then x 3 days after chemo      . fluconazole (DIFLUCAN) 100 MG tablet Take 100 mg by mouth Daily.      . frovatriptan (FROVA) 2.5 MG tablet Take 2.5 mg by mouth as needed. If recurs, may repeat after 2 hours. Max of 3 tabs in 24 hours.       Marland Kitchen levothyroxine (SYNTHROID, LEVOTHROID) 50 MCG tablet Take 50 mcg by mouth daily.        Marland Kitchen lidocaine-prilocaine (EMLA) cream Apply topically as needed.      Marland Kitchen liothyronine (CYTOMEL) 5 MCG tablet Take 5 mcg by mouth daily.        Marland Kitchen LORazepam (ATIVAN) 0.5 MG tablet Take 0.5 mg by mouth as needed.      . metoCLOPramide (REGLAN) 10 MG tablet Take 10 mg by mouth 4 times daily (before meals and at bedtime).      . ondansetron (ZOFRAN) 8 MG tablet Take 8 mg by mouth Twice daily as needed.      Marland Kitchen  oxyCODONE-acetaminophen (PERCOCET) 5-325 MG per tablet Take 1 tablet by mouth as needed.      . prochlorperazine (COMPAZINE) 10 MG tablet       . promethazine (PHENERGAN) 25 MG tablet       . tobramycin-dexamethasone (TOBRADEX) ophthalmic solution Place 1 drop into the left eye 2 (two) times daily.  5 mL  0  . valACYclovir (VALTREX) 1000 MG tablet Take 1 g by mouth Daily.       Family history: The patient's mother is alive at age 68. She had breast cancer diagnosed at the age of 102. The patient has 3 brothers no sisters. The patient's father died at the age of 21. A cousin on her father's side had breast cancer at the age of 67.  Gynecologic history: She had menarche age 4, simple hysterectomy in 2006. She is GX P2, first pregnancy to term age 33. She used birth control pills between 1972 and 1974. She took Clomid for fertility between 1983 and 1986.  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  Currently she is a homemaker. Her husband Beth Davis (goes by Beth Davis) is a Clinical research associate here in town. Daughter Beth Davis 26 lives in [place]. She works there as a Education officer, community. 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   Objective:  Filed Vitals:   10/03/11 1317  BP: 103/64  Pulse: 96  Temp: 98.3 F (36.8 C)   Body mass index is 20.14 kg/(m^2).  HEENT:  Sclerae anicteric. Oropharynx is clear. No ulcerations are noted. No candidiasis. Nodes:  No cervical, supraclavicular, or axillary lymphadenopathy palpated.  Breast Exam:  Deferred. Lungs:  Clear to auscultation bilaterally.  No crackles, rhonchi, or wheezes.   Heart:  Regular rate and rhythm.  No gallops, murmurs, or rubs. Abdomen:  Soft, nontender.  Positive bowel sounds.  No organomegaly or masses palpated.   Musculoskeletal:  No focal spinal tenderness to palpation.  Extremities:  Benign.  No peripheral edema or cyanosis.     Skin:  Nail dyscrasia bilaterally on the upper extremities with some loosening of the nails from the nailbed. No drainage, odor, or evidence of infection. The skin on the fingertips is also peeling slightly, but no cracking is noted. The skin is dry. Neuro:  Nonfocal.  Lab Results: baseline CA 27-29 was noninformative at 15  CMP      Component Value Date/Time   NA 141 09/20/2011 0946   K 3.5 09/20/2011 0946   CL 102 09/20/2011 0946   CO2 30 09/20/2011 0946   GLUCOSE 83 09/20/2011 0946   BUN 5* 09/20/2011 0946   CREATININE 0.56 09/20/2011 0946   CALCIUM 9.6 09/20/2011 0946   PROT 6.4 09/20/2011 0946   ALBUMIN 3.6 09/20/2011 0946   AST 18 09/20/2011 0946   ALT 25 09/20/2011 0946   ALKPHOS 112 09/20/2011 0946   BILITOT 0.2* 09/20/2011 0946   GFRNONAA >90 06/09/2011 1240   GFRAA >90 06/09/2011 1240    CBC Lab Results  Component Value Date   WBC 14.0* 10/03/2011   HGB 11.3* 10/03/2011   HCT 32.5* 10/03/2011   MCV 97.2 10/03/2011   PLT 273 10/03/2011    Studies/Results:  No new studies found.  Assessment: 61 year old [place] woman status post bilateral mastectomies October of 2012 for a left sided invasive ductal carcinoma, T1b N0 or Stage I, estrogen receptor 95% positive, progesterone receptor negative, with no HER-2 amplification and an MIB-1 of 6%; with an Oncotype DX score in the intermediate range, predicting a 19% risk of recurrence with 5 years of tamoxifen.    Currently being treated in the adjuvant setting with docetaxel/cyclophosphamide given every 3 weeks, the goal being to complete a total of 4 cycles. Neulasta is given on day 2 for granulocyte support.    Plan:  Beth Davis proceed to treatment today as scheduled for her fourth and final dose of adjuvant chemotherapy. She originally met with Dr. Mitzi Hansen prior to her definitive surgery, and would like to meet with him again to discuss any benefits associated with radiation therapy. We'll schedule her to see him in the next week. She's been to  return to see Dr. Darnelle Catalan on February 26 at that point they will also discuss her long-term followup plan. She would like to have her port removed as soon as possible by Dr. Jamey Ripa, and of course the timing of this depends on whether or not she proceed with any radiation therapy. She'll also be started on anti-estrogen therapy.   The plan has been reviewed with the patient who voices understanding  and agreement. She will call with any changes or problems.     Canyon Lohr, PA-C 10/03/2011

## 2011-10-03 NOTE — Telephone Encounter (Signed)
made patient appointment for 10-07-2011 at 8:00 to see dr.moody per orders from 10-03-2011

## 2011-10-04 ENCOUNTER — Ambulatory Visit (HOSPITAL_BASED_OUTPATIENT_CLINIC_OR_DEPARTMENT_OTHER): Payer: Commercial Managed Care - PPO

## 2011-10-04 VITALS — BP 97/55 | HR 72 | Temp 97.3°F

## 2011-10-04 DIAGNOSIS — C50919 Malignant neoplasm of unspecified site of unspecified female breast: Secondary | ICD-10-CM

## 2011-10-04 DIAGNOSIS — C50912 Malignant neoplasm of unspecified site of left female breast: Secondary | ICD-10-CM

## 2011-10-04 DIAGNOSIS — Z5189 Encounter for other specified aftercare: Secondary | ICD-10-CM

## 2011-10-04 MED ORDER — PEGFILGRASTIM INJECTION 6 MG/0.6ML
6.0000 mg | Freq: Once | SUBCUTANEOUS | Status: AC
  Administered 2011-10-04: 6 mg via SUBCUTANEOUS
  Filled 2011-10-04: qty 0.6

## 2011-10-06 ENCOUNTER — Encounter: Payer: Self-pay | Admitting: *Deleted

## 2011-10-06 NOTE — Progress Notes (Signed)
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

## 2011-10-07 ENCOUNTER — Ambulatory Visit
Admission: RE | Admit: 2011-10-07 | Discharge: 2011-10-07 | Disposition: A | Discharge status: Home / Self Care | Payer: Commercial Managed Care - PPO | Source: Ambulatory Visit | Attending: Radiation Oncology | Admitting: Radiation Oncology

## 2011-10-07 ENCOUNTER — Encounter: Payer: Self-pay | Admitting: Radiation Oncology

## 2011-10-07 VITALS — BP 112/77 | HR 106 | Temp 98.8°F | Resp 20 | Ht 62.5 in | Wt 109.9 lb

## 2011-10-07 DIAGNOSIS — C50919 Malignant neoplasm of unspecified site of unspecified female breast: Secondary | ICD-10-CM | POA: Insufficient documentation

## 2011-10-07 DIAGNOSIS — Z170 Estrogen receptor positive status [ER+]: Secondary | ICD-10-CM | POA: Insufficient documentation

## 2011-10-07 DIAGNOSIS — Z9010 Acquired absence of unspecified breast and nipple: Secondary | ICD-10-CM | POA: Insufficient documentation

## 2011-10-07 DIAGNOSIS — C50419 Malignant neoplasm of upper-outer quadrant of unspecified female breast: Secondary | ICD-10-CM

## 2011-10-07 NOTE — Progress Notes (Signed)
Encounter addended by: Glennie Hawk, RN on: 10/07/2011  8:18 AM<BR>     Documentation filed: Chief Complaint Section

## 2011-10-07 NOTE — Progress Notes (Signed)
Please see the Nurse Progress Note in the MD Initial Consult Encounter for this patient. 

## 2011-10-07 NOTE — Progress Notes (Signed)
Encounter addended by: Glennie Hawk, RN on: 10/07/2011 12:53 PM<BR>     Documentation filed: Inpatient Patient Education

## 2011-10-11 ENCOUNTER — Other Ambulatory Visit: Payer: Commercial Managed Care - PPO | Admitting: Lab

## 2011-10-11 ENCOUNTER — Other Ambulatory Visit (HOSPITAL_BASED_OUTPATIENT_CLINIC_OR_DEPARTMENT_OTHER): Payer: Commercial Managed Care - PPO | Admitting: Lab

## 2011-10-11 ENCOUNTER — Other Ambulatory Visit: Payer: Self-pay | Admitting: *Deleted

## 2011-10-11 ENCOUNTER — Ambulatory Visit: Payer: Commercial Managed Care - PPO | Admitting: Physician Assistant

## 2011-10-11 ENCOUNTER — Telehealth: Payer: Self-pay | Admitting: *Deleted

## 2011-10-11 ENCOUNTER — Ambulatory Visit (HOSPITAL_BASED_OUTPATIENT_CLINIC_OR_DEPARTMENT_OTHER): Payer: Commercial Managed Care - PPO | Admitting: Oncology

## 2011-10-11 VITALS — BP 116/77 | HR 88 | Temp 98.7°F | Ht 62.5 in | Wt 107.9 lb

## 2011-10-11 DIAGNOSIS — C50919 Malignant neoplasm of unspecified site of unspecified female breast: Secondary | ICD-10-CM

## 2011-10-11 LAB — CBC WITH DIFFERENTIAL/PLATELET
BASO%: 0 % (ref 0.0–2.0)
LYMPH%: 8.6 % — ABNORMAL LOW (ref 14.0–49.7)
MCHC: 34.5 g/dL (ref 31.5–36.0)
MONO#: 1.5 10*3/uL — ABNORMAL HIGH (ref 0.1–0.9)
Platelets: 284 10*3/uL (ref 145–400)
RBC: 3.35 10*6/uL — ABNORMAL LOW (ref 3.70–5.45)
WBC: 23.6 10*3/uL — ABNORMAL HIGH (ref 3.9–10.3)
lymph#: 2 10*3/uL (ref 0.9–3.3)

## 2011-10-11 NOTE — Telephone Encounter (Signed)
gave patient appointment for 10-2011 and 12-2011 printed out calendar and gave to the patient made patient appointment with dr.streck in 10-2011

## 2011-10-11 NOTE — Progress Notes (Signed)
ID: ROXINE WHITTINGHILL   DOB: 1951-05-12  MR#: [id number]  VOZ#:[id number]  HISTORY OF PRESENT ILLNESS: She had diagnostic mammography October of 2010 for bilateral punctate and amorphous microcalcifications, followed by breast-specific gamma imaging that same month which showed no abnormal isotope activity.   Diagnostic mammography October of 2011 was again unremarkable except for the fact that she does have very dense breasts, and for that reason, she does not just get screening mammography but diagnostic.  This was repeated May 24, 2011, and this time new calcifications were identified posteriorly within the upper outer quadrant of the left breast.  They were mildly pleomorphic, and breast-specific gamma imaging was repeated and showed increased activity posteriorly in the left breast adjacent to the chest wall, correlating with the area of new microcalcifications noted on mammography.   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  The invasive tumor appeared to be intermediate grade and was positive for E-cadherin There was no evidence of angiolymphatic invasion.  A breast prognostic profile on the invasive tumor showed it to be estrogen receptor positive, progesterone receptor and HER2 negative, with an MIB-1 of 25%.   The patient was referred to Dr. Carolynne Edouard, and bilateral breast MRIs were obtained May 30, 2011.  There was vague asymmetric enhancement in the lateral central aspect of the left breast measuring up to 8 mm.  There were no other suspicious masses, no axillary or internal mammary adenopathy, and a 7 mm lesion in the liver is felt to be likely a cyst or hemangioma. On 06/14/2011 the patient underwent bilateral mastectomies with results as detailed below.  INTERVAL HISTORY: The  patient returns today with her husband [name] after completing her fourth and final cycle of cyclophosphamide and docetaxel February18th 2013  REVIEW OF SYSTEMS: Overall she did well with the chemotherapy, but has become more anxious now. She is doing some reading add very informative sites including [facility]. She is learning that about one third of luminal B. breast cancers will recur -- having a negative progesterone receptor is unfavorable. Aside from the anxiety, a detailed review of systems was benign. She is planning to get back into her usual walking and other exercise routine. She would like to have her port removed ASAP.  PAST MEDICAL HISTORY: Past Medical History  Diagnosis Date  . Hypothyroidism   . Collagenous colitis   . Carpal tunnel syndrome of right wrist   . Osteoporosis   . Breast cancer, IDC, Left, ER +, PR -, Her2 -. 06/01/2011  . Breast cancer 05/25/2011    left,inv ductal carcinoma, DCIS, ER+, PR, Her2  -  migraines  PAST SURGICAL HISTORY: Past Surgical History  Procedure Date  . Laparoscopy abdomen diagnostic   . Orif femur fracture   . Orif humerus decompression   . Colonoscopy 2012  . Mastectomy 06/14/2011    Right - prophylactic  . Simple mastectomy w/ sentinel node biopsy 06/14/2011    Left  . Breast surgery 2004    excision left breast fibroadenoma  . Portacath placement 07/26/2011    Procedure: INSERTION PORT-A-CATH;  Surgeon: Currie Paris, MD;  Location: [facility];  Service: General;  Laterality: N/A;  port a cath placement  . Abdominal hysterectomy 2006    FAMILY HISTORY Family History  Problem Relation Age of Onset  .  Cancer Mother 96    breast  . Cancer Cousin 52    breast  . Heart attack Father   The patient's father died at the age of 78.  Her mother is alive at age 75.  She has 3 brothers, no sisters.  The patient's mother did have breast cancer at the age of 46.  A paternal cousin had breast cancer at the age of 70.  The  patient has been tested and is BRCA 1-2 negative  GYNECOLOGIC HISTORY: Menarche age 77.   Simple hysterectomy 2006.  She is GX, P2, 1st pregnancy to term age 62, which of course is a risk factor for breast cancer.  She used birth control pills between 58 and 1974 and took Clomid for fertility between 1983 and 1986.  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  Her husband, [name] (goes by [name]), Dennen is a Clinical research associate here in town.  Daughter, [name], 67, lives in [place], [place] where she works as a Education officer, community.  Daughter, [name], 24, is a Gaffer here in [place] in Location manager.   ADVANCED DIRECTIVES: in place  HEALTH MAINTENANCE: History  Substance Use Topics  . Smoking status: Former Smoker -- 0.5 packs/day for 6 years    Types: Cigarettes    Quit date: 07/21/1975  . Smokeless tobacco: Not on file   Comment: smoked in college  . Alcohol Use: No     Colonoscopy: October 2012  PAP: s/p hysterectomy  Bone density: October 2011    Allergies  Allergen Reactions  . Codeine Nausea Only  . Erythromycin Nausea Only  . Nsaids     Hx collagenous colitis  . Penicillins Hives  . Reglan (Metoclopramide Hcl) Diarrhea    Current Outpatient Prescriptions  Medication Sig Dispense Refill  . b complex vitamins tablet Take 1 tablet by mouth daily.      . budesonide (ENTOCORT EC) 3 MG 24 hr capsule       . cholecalciferol (VITAMIN D) 1000 UNITS tablet Take 1,000 Units by mouth daily.        . fluconazole (DIFLUCAN) 100 MG tablet Take 100 mg by mouth Daily.      . frovatriptan (FROVA) 2.5 MG tablet Take 2.5 mg by mouth as needed. If recurs, may repeat after 2 hours. Max of 3 tabs in 24 hours.       Marland Kitchen levothyroxine (SYNTHROID, LEVOTHROID) 50 MCG tablet Take 50 mcg by mouth daily.        Marland Kitchen lidocaine-prilocaine (EMLA) cream Apply topically as needed.      Marland Kitchen liothyronine  (CYTOMEL) 5 MCG tablet Take 5 mcg by mouth daily.        Marland Kitchen LORazepam (ATIVAN) 0.5 MG tablet Take 0.5 mg by mouth as needed.      . tobramycin-dexamethasone (TOBRADEX) ophthalmic solution Place 1 drop into the left eye 2 (two) times daily.  5 mL  0  . valACYclovir (VALTREX) 1000 MG tablet Take 1 g by mouth Daily.      . promethazine (PHENERGAN) 25 MG tablet         OBJECTIVE: Middle-aged white woman who appears anxious. Filed Vitals:   10/11/11 1016  BP: 116/77  Pulse: 88  Temp: 98.7 F (37.1 C)     Body mass index is 19.42 kg/(m^2).    ECOG FS: 1  Sclerae unicteric Oropharynx clear No peripheral adenopathy Lungs no  rales or rhonchi Heart regular rate and rhythm Abd benign MSK no focal spinal tenderness, no peripheral edema Neuro: nonfocal Breasts: Status post bilateral mastectomies. No evidence of local recurrence  LAB RESULTS: Lab Results  Component Value Date   WBC 23.6* 10/11/2011   NEUTROABS 20.0* 10/11/2011   HGB 11.4* 10/11/2011   HCT 33.0* 10/11/2011   MCV 98.6 10/11/2011   PLT 284 10/11/2011      Chemistry      Component Value Date/Time   NA 140 10/03/2011 1227   K 4.0 10/03/2011 1227   CL 105 10/03/2011 1227   CO2 26 10/03/2011 1227   BUN 12 10/03/2011 1227   CREATININE 0.49* 10/03/2011 1227      Component Value Date/Time   CALCIUM 9.5 10/03/2011 1227   ALKPHOS 80 10/03/2011 1227   AST 19 10/03/2011 1227   ALT 19 10/03/2011 1227   BILITOT 0.3 10/03/2011 1227       Lab Results  Component Value Date   LABCA2 15 06/01/2011     STUDIES: No new results found.  ASSESSMENT: 61 year old [place] woman status post bilateral mastectomies October of 2012 for a left sided invasive ductal carcinoma, T1b N0 or Stage IA, grade 2, estrogen receptor 95% positive, progesterone receptor negative, with no HER-2 amplification and an MIB-1 of 26%; Oncotype DX score in the intermediate range, predicting a 19% risk of recurrence with 5 years of tamoxifen; treated in the adjuvant  setting with docetaxel/cyclophosphamide x4 completed 10/03/2011  PLAN: She did terrific with chemotherapy and is now ready to have her port removed. She will not need postmastectomy radiation. I would prefer that she wait until April 1 or thereabouts to start her anti-estrogen therapy, so she does not confuse residual symptoms from her chemotherapy with a margin possible symptoms from the letrozole. She has been doing quite a bit of reading and is aware of the possible toxicities side effects and complications of this medication, particularly concerns regarding osteopenia/osteoporosis acceleration.  I went ahead and wrote her the prescription. It should be well under $10 a month since she is a member of [employer]. I think she would benefit from a zoledronic acid given every 6 months first 2 ovoid any worsening of the bone density issue and second because in postmenopausal women there is a documented of benefit in terms of breast cancer recurrence risk reduction.  We went over her prognosis which I think is very good. If all she did after surgery was take tamoxifen for 5 years, her recurrence risk after 10 years would be in the 19% range. However she has taken chemotherapy, which generally reduces the risk by one third. Secondly she will be taking an aromatase inhibitor, which is known to be more effective than tamoxifen. Finally she may get a few extra percentage points in risk reduction from the zoledronic acid. Given all this, I quoted her a risk of recurrence in the 10% range or so. It is very natural to be severely anxious at the time of chemotherapy completion, which is one reason were going to be seeing her in about 3 weeks. I would like to see her or perhaps 6 or 7 weeks after starting the letrozole. If she is tolerating it well at that time we will initiate long-term followup.  Incidentally she will have a repeat bone density before she sees me in May   Esa Raden C    10/11/2011

## 2011-10-13 ENCOUNTER — Telehealth (INDEPENDENT_AMBULATORY_CARE_PROVIDER_SITE_OTHER): Payer: Self-pay | Admitting: General Surgery

## 2011-10-13 NOTE — Progress Notes (Signed)
CC:   Beth Davis, M.D. Beth Davis. Beth Davis, M.D. Beth Ruths, MD  DIAGNOSIS:  Invasive ductal carcinoma of the left breast, T1b N0, ER positive, PR negative and HER-2/neu negative.  There were multiple foci and this was a grade 2, invasive ductal carcinoma with associated intermediate grade DCIS.  The surgical margins are negative.  INTERVAL HISTORY:  Beth Davis is seen today for follow up.  The patient was initially felt to be a breast conservation candidate but she ultimately has proceeded with  bilateral mastectomy on 06/14/2011.  The right, a simple mastectomy, was negative for malignancy and none of the 4 lymph nodes showed carcinoma. On the left, there were multiple foci of invasive ductal carcinoma, grade 2, spanning 0.8 cm.  Associated DCIS, which was intermediate grade, was present.  Margins were widely negative at 0.6 cm to deep margin with respect to both the invasive and in situ disease.  None of the 3 lymph nodes in the mastectomy specimen showed carcinoma and 4 additional lymph nodes also were all negative.  This corresponded to a pT1b N0 tumor, which was multifocal, and receptor studies have indicated that the tumor is ER positive, PR negative and HER-2/neu negative.  The proliferative index was 26%.  The patient had an Oncotype DX study performed and this fell in the intermediate category, predicting a 19% risk of recurrence if she took tamoxifen for 5 years.  The patient has proceeded, therefore, with chemotherapy and this will be followed by 5-10 years of antiestrogen treatment.  The patient presents today for discussion of her case to determine if there was any role for postmastectomy radiation for her.  PHYSICAL EXAMINATION:  Weight 107 pounds, blood pressure 116/77, pulse 88, respiratory rate 20, temperature 98.7.  IMPRESSION AND PLAN:  Beth Davis came in today to review her case in detail.  She has done it appears a lot of reading and has  focused on some of the detailed molecular work which has been completed for her case and some of this has her quite worried.  She was wondering if radiation would be appropriate and of substantial benefit for her, but I do not believe that this is the case.  I discussed for some time with her the rationale and thought process with regards to postmastectomy radiation, and this really is driven by tumor size, axillary nodal status and margin status.  Her case, from this perspective, I think is favorable despite the multifocal nature of her disease, but a mastectomy was performed with widely negative margins and I believe that this should afford her the expectation of excellent local control.  My recommendation therefore is for her not to proceed with postmastectomy radiation and again, all of her questions were answered and we did discuss this in great detail.  I spent 25 minutes with Beth Davis today, the majority of which was spent counseling her on this diagnosis and coordinating her care which will not be, after this discussion, proceeding with radiation.  She seemed to understand the rationale of this and was comfortable with this recommendation.  Therefore we will have her return to our clinic on a p.r.n. basis.    ______________________________ Radene Gunning, M.D., Ph.D. JSM/MEDQ  D:  10/12/2011  T:  10/13/2011  Job:  [id number]

## 2011-10-13 NOTE — Telephone Encounter (Signed)
Patient wants to proceed with Port removal.

## 2011-10-14 ENCOUNTER — Other Ambulatory Visit (INDEPENDENT_AMBULATORY_CARE_PROVIDER_SITE_OTHER): Payer: Self-pay | Admitting: Surgery

## 2011-10-14 NOTE — Telephone Encounter (Signed)
Per Dr Jamey Ripa, okay to proceed. Orders will be placed. Patient aware.

## 2011-10-17 ENCOUNTER — Other Ambulatory Visit: Payer: Self-pay | Admitting: *Deleted

## 2011-10-17 DIAGNOSIS — C50919 Malignant neoplasm of unspecified site of unspecified female breast: Secondary | ICD-10-CM

## 2011-10-19 ENCOUNTER — Telehealth: Payer: Self-pay | Admitting: *Deleted

## 2011-10-19 NOTE — Telephone Encounter (Signed)
Beth Davis called asking for an order for bone density.  Patient is there now.  Asked that order be faxed to [phone].  This RN faxed order entered in EPIC to [facility].

## 2011-10-21 ENCOUNTER — Encounter (INDEPENDENT_AMBULATORY_CARE_PROVIDER_SITE_OTHER): Payer: Self-pay | Admitting: Surgery

## 2011-10-21 ENCOUNTER — Ambulatory Visit (INDEPENDENT_AMBULATORY_CARE_PROVIDER_SITE_OTHER): Payer: Commercial Managed Care - PPO | Admitting: Surgery

## 2011-10-21 VITALS — BP 110/78 | HR 72 | Temp 97.6°F | Resp 12 | Ht 63.0 in | Wt 114.2 lb

## 2011-10-21 DIAGNOSIS — Z853 Personal history of malignant neoplasm of breast: Secondary | ICD-10-CM

## 2011-10-21 NOTE — Progress Notes (Signed)
NAME: Beth Davis       DOB: 07-Apr-1951           DATE: 10/21/2011       MRN: [id number]   Beth Davis is a 61 y.o.Marland Kitchenfemale who presents for routine followup of her Left breast cancer diagnosed in 2012 and treated with mastectomy and chemo. She also had a prophylactic right mastectomy. She has no problems or concerns on either side. She is ready to get her port removed PFSH: She has had no significant changes since the last visit here.  ROS: There have been no significant changes since the last visit here  EXAM: General: The patient is alert, oriented, generally healty appearing, NAD. Mood and affect are normal.  Breasts:  s/p Bilateral mastectomy. No evidence of local recurrence  Lymphatics: She has no axillary or supraclavicular adenopathy on either side.  Extremities: Full ROM of the surgical side with no lymphedema noted.  Data Reviewed: Notes in Epic  Impression: Doing well, with no evidence of recurrent cancer or new cancer  Plan: Will see in 6 months for a f/u. She is scheduled for port removal on Monday

## 2011-10-21 NOTE — Patient Instructions (Signed)
I will see you Monday to get your port removed

## 2011-10-24 ENCOUNTER — Encounter (HOSPITAL_BASED_OUTPATIENT_CLINIC_OR_DEPARTMENT_OTHER): Payer: Self-pay | Admitting: *Deleted

## 2011-10-24 ENCOUNTER — Ambulatory Visit (HOSPITAL_BASED_OUTPATIENT_CLINIC_OR_DEPARTMENT_OTHER)
Admission: RE | Admit: 2011-10-24 | Discharge: 2011-10-24 | Disposition: A | Discharge status: Home / Self Care | Payer: Commercial Managed Care - PPO | Source: Ambulatory Visit | Attending: Surgery | Admitting: Surgery

## 2011-10-24 ENCOUNTER — Encounter (HOSPITAL_BASED_OUTPATIENT_CLINIC_OR_DEPARTMENT_OTHER)
Admission: RE | Disposition: A | Discharge status: Home / Self Care | Payer: Self-pay | Source: Ambulatory Visit | Attending: Surgery

## 2011-10-24 DIAGNOSIS — Z452 Encounter for adjustment and management of vascular access device: Secondary | ICD-10-CM

## 2011-10-24 DIAGNOSIS — C50912 Malignant neoplasm of unspecified site of left female breast: Secondary | ICD-10-CM

## 2011-10-24 HISTORY — PX: PORT-A-CATH REMOVAL: SHX5289

## 2011-10-24 SURGERY — MINOR REMOVAL PORT-A-CATH
Anesthesia: LOCAL | Site: Chest | Laterality: Right | Wound class: Clean

## 2011-10-24 MED ORDER — LIDOCAINE-EPINEPHRINE (PF) 1 %-1:200000 IJ SOLN
INTRAMUSCULAR | Status: DC | PRN
  Administered 2011-10-24: 10 mL

## 2011-10-24 MED ORDER — SODIUM BICARBONATE 4 % IV SOLN
INTRAVENOUS | Status: DC | PRN
  Administered 2011-10-24: 4 mL via INTRAVENOUS

## 2011-10-24 SURGICAL SUPPLY — 31 items
ADH SKN CLS APL DERMABOND .7 (GAUZE/BANDAGES/DRESSINGS) ×1
APL SKNCLS STERI-STRIP NONHPOA (GAUZE/BANDAGES/DRESSINGS)
BENZOIN TINCTURE PRP APPL 2/3 (GAUZE/BANDAGES/DRESSINGS) IMPLANT
BLADE SURG 15 STRL LF DISP TIS (BLADE) ×1 IMPLANT
BLADE SURG 15 STRL SS (BLADE) ×2
CHLORAPREP W/TINT 26ML (MISCELLANEOUS) ×2 IMPLANT
CLOTH BEACON ORANGE TIMEOUT ST (SAFETY) ×2 IMPLANT
DERMABOND ADVANCED (GAUZE/BANDAGES/DRESSINGS) ×1
DERMABOND ADVANCED .7 DNX12 (GAUZE/BANDAGES/DRESSINGS) IMPLANT
DRSG TEGADERM 4X4.75 (GAUZE/BANDAGES/DRESSINGS) IMPLANT
ELECT REM PT RETURN 9FT ADLT (ELECTROSURGICAL)
ELECTRODE REM PT RTRN 9FT ADLT (ELECTROSURGICAL) IMPLANT
GAUZE SPONGE 4X4 12PLY STRL LF (GAUZE/BANDAGES/DRESSINGS) IMPLANT
GAUZE SPONGE 4X4 16PLY XRAY LF (GAUZE/BANDAGES/DRESSINGS) IMPLANT
GLOVE BIO SURGEON STRL SZ8 (GLOVE) ×1 IMPLANT
GLOVE ECLIPSE 8.0 STRL XLNG CF (GLOVE) ×1 IMPLANT
GLOVE EUDERMIC 7 POWDERFREE (GLOVE) ×2 IMPLANT
MARKER SKIN DUAL TIP RULER LAB (MISCELLANEOUS) ×2 IMPLANT
NDL HYPO 25X1 1.5 SAFETY (NEEDLE) ×1 IMPLANT
NDL SAFETY ECLIPSE 18X1.5 (NEEDLE) ×1 IMPLANT
NEEDLE HYPO 18GX1.5 SHARP (NEEDLE)
NEEDLE HYPO 25X1 1.5 SAFETY (NEEDLE) ×2 IMPLANT
PENCIL BUTTON HOLSTER BLD 10FT (ELECTRODE) IMPLANT
STRIP CLOSURE SKIN 1/2X4 (GAUZE/BANDAGES/DRESSINGS) IMPLANT
SUT MNCRL AB 4-0 PS2 18 (SUTURE) ×2 IMPLANT
SUT VIC AB 3-0 FS2 27 (SUTURE) IMPLANT
SUT VIC AB 4-0 BRD 54 (SUTURE) IMPLANT
SUT VIC AB 4-0 P-3 18XBRD (SUTURE) IMPLANT
SUT VIC AB 4-0 P3 18 (SUTURE)
SUT VIC AB 4-0 SH 18 (SUTURE) ×1 IMPLANT
SYR CONTROL 10ML LL (SYRINGE) ×2 IMPLANT

## 2011-10-24 NOTE — Discharge Instructions (Signed)
You may resume all normal activities You may shower tonight. All sutures dissolve so none need to be removed If you have any problems call the office  [phone]

## 2011-10-24 NOTE — Op Note (Signed)
Beth Davis 06-08-1951 [id number] 10/14/2011  Preoperative diagnosis: Un-Needed PAC  Postoperative diagnosis: Same  Procedure: Portacath Removal  Surgeon: Currie Paris, MD, FACS  Anesthesia:Local   Clinical History and Indications: The patient has finished her chemotherapy and no longer needs a port. She wishes to have it removed.  Procedure: The patient was seen in the preoperative area and we confirmed the plans for the procedure as noted above. The Port-A-Cath site was identified and marked. The patient had no further questions.  The patient was then taken into the procedure room. The timeout was done. The area over the Port-A-Cath was anesthetized with 1% Xylocaine with epinephrine. I waited about 10 minutes and then the area was prepped and draped.  The old scar was opened. The capsule around the port opened and the port identified. The holding sutures were cut. The catheter was backed partially out of its tract. A figure 8 3-0 Vicryl suture was placed, the tubing removed, and the suture tied down to prevent backbleeding.  The port was then removed from its pocket. I made sure everything was dry. The incision was closed with 3-0 Vicryl, 4-0 Monocryl subcuticular, and Dermabond.  The patient tolerated the procedure well. There were no complications.  Currie Paris, MD, FACS 10/24/2011 9:41 AM

## 2011-10-26 ENCOUNTER — Encounter (HOSPITAL_BASED_OUTPATIENT_CLINIC_OR_DEPARTMENT_OTHER): Payer: Self-pay | Admitting: Surgery

## 2011-10-27 ENCOUNTER — Other Ambulatory Visit: Payer: Self-pay | Admitting: *Deleted

## 2011-10-27 MED ORDER — LETROZOLE 2.5 MG PO TABS: 2.5000 mg | ORAL_TABLET | Freq: Every day | ORAL | Status: AC

## 2011-11-01 ENCOUNTER — Other Ambulatory Visit: Payer: Commercial Managed Care - PPO

## 2011-11-01 ENCOUNTER — Ambulatory Visit: Payer: Commercial Managed Care - PPO | Admitting: Physician Assistant

## 2011-11-02 ENCOUNTER — Ambulatory Visit (HOSPITAL_BASED_OUTPATIENT_CLINIC_OR_DEPARTMENT_OTHER): Payer: Commercial Managed Care - PPO | Admitting: Physician Assistant

## 2011-11-02 ENCOUNTER — Encounter: Payer: Self-pay | Admitting: Physician Assistant

## 2011-11-02 ENCOUNTER — Other Ambulatory Visit (HOSPITAL_BASED_OUTPATIENT_CLINIC_OR_DEPARTMENT_OTHER): Payer: Commercial Managed Care - PPO

## 2011-11-02 VITALS — BP 117/70 | HR 73 | Temp 98.8°F | Ht 63.0 in | Wt 114.7 lb

## 2011-11-02 DIAGNOSIS — C50912 Malignant neoplasm of unspecified site of left female breast: Secondary | ICD-10-CM

## 2011-11-02 DIAGNOSIS — C50919 Malignant neoplasm of unspecified site of unspecified female breast: Secondary | ICD-10-CM

## 2011-11-02 DIAGNOSIS — C50419 Malignant neoplasm of upper-outer quadrant of unspecified female breast: Secondary | ICD-10-CM

## 2011-11-02 DIAGNOSIS — Z170 Estrogen receptor positive status [ER+]: Secondary | ICD-10-CM

## 2011-11-02 LAB — CBC WITH DIFFERENTIAL/PLATELET
BASO%: 0.4 % (ref 0.0–2.0)
Basophils Absolute: 0 10*3/uL (ref 0.0–0.1)
EOS%: 1.2 % (ref 0.0–7.0)
Eosinophils Absolute: 0.1 10*3/uL (ref 0.0–0.5)
HCT: 34.3 % — ABNORMAL LOW (ref 34.8–46.6)
HGB: 11.3 g/dL — ABNORMAL LOW (ref 11.6–15.9)
LYMPH%: 11.2 % — ABNORMAL LOW (ref 14.0–49.7)
MCH: 33.1 pg (ref 25.1–34.0)
MCHC: 32.9 g/dL (ref 31.5–36.0)
MCV: 100.6 fL (ref 79.5–101.0)
MONO#: 0.4 10*3/uL (ref 0.1–0.9)
MONO%: 8.4 % (ref 0.0–14.0)
NEUT#: 3.9 10*3/uL (ref 1.5–6.5)
NEUT%: 78.8 % — ABNORMAL HIGH (ref 38.4–76.8)
Platelets: 264 10*3/uL (ref 145–400)
RBC: 3.41 10*6/uL — ABNORMAL LOW (ref 3.70–5.45)
RDW: 15 % — ABNORMAL HIGH (ref 11.2–14.5)
WBC: 4.9 10*3/uL (ref 3.9–10.3)
lymph#: 0.6 10*3/uL — ABNORMAL LOW (ref 0.9–3.3)
nRBC: 0 % (ref 0–0)

## 2011-11-02 NOTE — Progress Notes (Signed)
ID: Beth Davis   DOB: [birth year]  MR#: [id number]  WUJ#:[id number]  HISTORY OF PRESENT ILLNESS: She had diagnostic mammography October of 2010 for bilateral punctate and amorphous microcalcifications, followed by breast-specific gamma imaging that same month which showed no abnormal isotope activity.   Diagnostic mammography October of 2011 was again unremarkable except for the fact that she does have very dense breasts, and for that reason, she does not just get screening mammography but diagnostic.  This was repeated May 24, 2011, and this time new calcifications were identified posteriorly within the upper outer quadrant of the left breast.  They were mildly pleomorphic, and breast-specific gamma imaging was repeated and showed increased activity posteriorly in the left breast adjacent to the chest wall, correlating with the area of new microcalcifications noted on mammography.   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  The invasive tumor appeared to be intermediate grade and was positive for E-cadherin There was no evidence of angiolymphatic invasion.  A breast prognostic profile on the invasive tumor showed it to be estrogen receptor positive, progesterone receptor and HER2 negative, with an MIB-1 of 25%.   The patient was referred to Dr. Carolynne Edouard, and bilateral breast MRIs were obtained May 30, 2011.  There was vague asymmetric enhancement in the lateral central aspect of the left breast measuring up to 8 mm.  There were no other suspicious masses, no axillary or internal mammary adenopathy, and a 7 mm lesion in the liver is felt to be likely a cyst or hemangioma. On 06/14/2011 the patient underwent bilateral mastectomies with results as detailed below.  INTERVAL HISTORY: Beth Davis  returns today for followup visit, having completed her adjuvant cyclophosphamide and docetaxel, last dose given on February18th 2013.  She is here to discuss letrozole, which she is scheduled to begin on April 1. We did spend over half of our 30 minute appointment today discussing letrozole, its benefits, and its possible side effects.  Overall  [name] is feeling well. She feels that she has recovered well from chemotherapy, with only some mild residual fatigue remaining. She feels a little less anxious about her diagnosis. She is ready to begin the aromatase inhibitor on April 1 as discussed with Dr. Darnelle Catalan.  REVIEW OF SYSTEMS:  Physically, Beth Davis has had no recent illnesses, and denies fevers, chills, or night sweats. She currently has no hot flashes. She's had no abnormal headaches, bony pain, joint pain, or unusual pain. No nausea or change in bowel habits. No chest pain or shortness of breath.  A detailed review of systems is otherwise noncontributory.   PAST MEDICAL HISTORY: Past Medical History  Diagnosis Date  . Hypothyroidism   . Collagenous colitis   . Carpal tunnel syndrome of right wrist   . Osteoporosis   . Breast cancer, IDC, Left, ER +, PR -, Her2 -. 06/01/2011  . Breast cancer 05/25/2011    left,inv ductal carcinoma, DCIS, ER+, PR, Her2  -  migraines  PAST SURGICAL HISTORY: Past Surgical History  Procedure Date  . Laparoscopy abdomen diagnostic   . Orif femur fracture   . Orif humerus decompression   . Colonoscopy 2012  . Mastectomy 06/14/2011    Right - prophylactic  . Simple mastectomy w/ sentinel node biopsy 06/14/2011    Left  . Breast surgery 2004  excision left breast fibroadenoma  . Portacath placement 07/26/2011    Procedure: INSERTION PORT-A-CATH;  Surgeon: Currie Paris, MD;  Location: [facility];  Service: General;  Laterality: N/A;  port a cath placement  . Abdominal hysterectomy 2006  . Port-a-cath removal 10/24/2011     Procedure: MINOR REMOVAL PORT-A-CATH;  Surgeon: Currie Paris, MD;  Location: [facility];  Service: General;  Laterality: Right;  removal of portacath    FAMILY HISTORY Family History  Problem Relation Age of Onset  . Cancer Mother 19    breast  . Cancer Cousin 54    breast  . Heart attack Father   The patient's father died at the age of 22.  Her mother is alive at age 13.  She has 3 brothers, no sisters.  The patient's mother did have breast cancer at the age of 14.  A paternal cousin had breast cancer at the age of 77.  The patient has been tested and is BRCA 1-2 negative  GYNECOLOGIC HISTORY: Menarche age 28.   Simple hysterectomy 2006.  She is GX, P2, 1st pregnancy to term age 75, which of course is a risk factor for breast cancer.  She used birth control pills between 11 and 1974 and took Clomid for fertility between 1983 and 1986.  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  Her husband, [name] (goes by [name]), [place] is a Clinical research associate here in town.  Daughter, Beth Davis, 52, lives in [place], [place] where she works as a Education officer, community.  Daughter, Beth Davis, 24, is a Gaffer here in [place] in Location manager.   ADVANCED DIRECTIVES: in place  HEALTH MAINTENANCE: History  Substance Use Topics  . Smoking status: Former Smoker -- 0.5 packs/day for 6 years    Types: Cigarettes    Quit date: 07/21/1975  . Smokeless tobacco: Not on file   Comment: smoked in college  . Alcohol Use: No     Colonoscopy: October 2012  PAP: s/p hysterectomy  Bone density: October 2011    Allergies  Allergen Reactions  . Codeine Nausea Only  . Erythromycin Nausea Only  . Nsaids     Hx collagenous colitis  . Penicillins Hives  . Reglan (Metoclopramide Hcl) Diarrhea    Current Outpatient Prescriptions  Medication Sig Dispense Refill  . calcium citrate-vitamin D  (CITRACAL+D) 315-200 MG-UNIT per tablet Take 1 tablet by mouth 4 (four) times daily.      Marland Kitchen b complex vitamins tablet Take 1 tablet by mouth daily.      . budesonide (ENTOCORT EC) 3 MG 24 hr capsule       . cholecalciferol (VITAMIN D) 1000 UNITS tablet Take 1,000 Units by mouth daily.        . frovatriptan (FROVA) 2.5 MG tablet Take 2.5 mg by mouth as needed. If recurs, may repeat after 2 hours. Max of 3 tabs in 24 hours.       Marland Kitchen letrozole (FEMARA) 2.5 MG tablet Take 1 tablet (2.5 mg total) by mouth daily.  90 tablet  4  . levothyroxine (SYNTHROID, LEVOTHROID) 50 MCG tablet Take 50 mcg by mouth daily.        Marland Kitchen liothyronine (CYTOMEL) 5 MCG tablet Take 5 mcg by mouth daily.        Marland Kitchen LORazepam (ATIVAN) 0.5 MG tablet Take 0.5 mg by mouth as needed.  OBJECTIVE: Middle-aged white woman who appears only mildly anxious. Filed Vitals:   11/02/11 1304  BP: 117/70  Pulse: 73  Temp: 98.8 F (37.1 C)     Body mass index is 20.32 kg/(m^2).    ECOG FS: 0  Remainder of physical exam was deferred today.    LAB RESULTS: Lab Results  Component Value Date   WBC 4.9 11/02/2011   NEUTROABS 3.9 11/02/2011   HGB 11.3* 11/02/2011   HCT 34.3* 11/02/2011   MCV 100.6 11/02/2011   PLT 264 11/02/2011      Chemistry      Component Value Date/Time   NA 140 10/03/2011 1227   K 4.0 10/03/2011 1227   CL 105 10/03/2011 1227   CO2 26 10/03/2011 1227   BUN 12 10/03/2011 1227   CREATININE 0.49* 10/03/2011 1227      Component Value Date/Time   CALCIUM 9.5 10/03/2011 1227   ALKPHOS 80 10/03/2011 1227   AST 19 10/03/2011 1227   ALT 19 10/03/2011 1227   BILITOT 0.3 10/03/2011 1227       Lab Results  Component Value Date   LABCA2 15 06/01/2011     STUDIES:  Bone density obtained at [facility] on 10/19/2011 showed osteopenia, overall, slightly improved since 2011.  ASSESSMENT: 61 year old [place] woman status post bilateral mastectomies October of 2012 for a left sided invasive ductal carcinoma, T1b N0 or Stage  IA, grade 2, estrogen receptor 95% positive, progesterone receptor negative, with no HER-2 amplification and an MIB-1 of 26%; Oncotype DX score in the intermediate range, predicting a 19% risk of recurrence with 5 years of tamoxifen; treated in the adjuvant setting with docetaxel/cyclophosphamide x4 completed 10/03/2011  PLAN:  Karaline we'll begin her letrozole on April 1. She'll return to see Dr. Darnelle Catalan approximately 4-6 weeks later to assess tolerance of the medication. At that point he will also review her bone density study with her once again, and Iwill discuss the need for possible bisphosphonates in the future.   All of this was discussed thoroughly with the patient who voices understanding and agreement with this plan. She will call any changes or problems.  Rivaldo Hineman    11/02/2011

## 2011-11-14 ENCOUNTER — Encounter (INDEPENDENT_AMBULATORY_CARE_PROVIDER_SITE_OTHER): Payer: Commercial Managed Care - PPO | Admitting: Surgery

## 2011-11-16 ENCOUNTER — Other Ambulatory Visit: Payer: Commercial Managed Care - PPO | Admitting: Lab

## 2011-11-16 ENCOUNTER — Ambulatory Visit: Payer: Commercial Managed Care - PPO | Admitting: Physician Assistant

## 2011-12-09 ENCOUNTER — Telehealth: Payer: Self-pay | Admitting: Oncology

## 2011-12-09 ENCOUNTER — Encounter: Payer: Self-pay | Admitting: *Deleted

## 2011-12-09 NOTE — Progress Notes (Signed)
Pt reports that she is having pain in right thigh for approx. 1 month. Pt is concerned about bone mets. Pt had existing appt for 5/13th with labs. Scheduling has been notified to re-schedule labs and MD visit for 4/29 at 1130

## 2011-12-09 NOTE — Telephone Encounter (Signed)
S/w the pt and she is aware of her April 29th appts. Cancelled the may 2013 appts

## 2011-12-12 ENCOUNTER — Other Ambulatory Visit: Payer: Commercial Managed Care - PPO | Admitting: Lab

## 2011-12-12 ENCOUNTER — Ambulatory Visit (HOSPITAL_COMMUNITY)
Admission: RE | Admit: 2011-12-12 | Discharge: 2011-12-12 | Disposition: A | Discharge status: Home / Self Care | Payer: Commercial Managed Care - PPO | Source: Ambulatory Visit | Attending: Oncology | Admitting: Oncology

## 2011-12-12 ENCOUNTER — Ambulatory Visit (HOSPITAL_BASED_OUTPATIENT_CLINIC_OR_DEPARTMENT_OTHER): Payer: Commercial Managed Care - PPO | Admitting: Oncology

## 2011-12-12 ENCOUNTER — Telehealth: Payer: Self-pay | Admitting: *Deleted

## 2011-12-12 VITALS — BP 122/78 | HR 69 | Temp 98.4°F | Ht 63.0 in | Wt 115.3 lb

## 2011-12-12 DIAGNOSIS — C50912 Malignant neoplasm of unspecified site of left female breast: Secondary | ICD-10-CM

## 2011-12-12 DIAGNOSIS — M79609 Pain in unspecified limb: Secondary | ICD-10-CM | POA: Insufficient documentation

## 2011-12-12 DIAGNOSIS — C50919 Malignant neoplasm of unspecified site of unspecified female breast: Secondary | ICD-10-CM | POA: Insufficient documentation

## 2011-12-12 LAB — COMPREHENSIVE METABOLIC PANEL
ALT: 13 U/L (ref 0–35)
AST: 19 U/L (ref 0–37)
Calcium: 9.8 mg/dL (ref 8.4–10.5)
Chloride: 107 mEq/L (ref 96–112)
Creatinine, Ser: 0.65 mg/dL (ref 0.50–1.10)
Sodium: 143 mEq/L (ref 135–145)
Total Protein: 5.9 g/dL — ABNORMAL LOW (ref 6.0–8.3)

## 2011-12-12 LAB — CBC WITH DIFFERENTIAL/PLATELET
BASO%: 0.5 % (ref 0.0–2.0)
Eosinophils Absolute: 0.1 10*3/uL (ref 0.0–0.5)
LYMPH%: 30 % (ref 14.0–49.7)
MCHC: 33.3 g/dL (ref 31.5–36.0)
MONO#: 0.3 10*3/uL (ref 0.1–0.9)
NEUT#: 1.7 10*3/uL (ref 1.5–6.5)
Platelets: 197 10*3/uL (ref 145–400)
RBC: 4.04 10*6/uL (ref 3.70–5.45)
RDW: 12.1 % (ref 11.2–14.5)
WBC: 3.1 10*3/uL — ABNORMAL LOW (ref 3.9–10.3)
lymph#: 0.9 10*3/uL (ref 0.9–3.3)
nRBC: 0 % (ref 0–0)

## 2011-12-12 NOTE — Progress Notes (Signed)
ID: MANDY PEEKS   DOB: 05-11-1951  MR#: [id number]  NWG#:[id number]  HISTORY OF PRESENT ILLNESS: She had diagnostic mammography October of 2010 for bilateral punctate and amorphous microcalcifications, followed by breast-specific gamma imaging that same month which showed no abnormal isotope activity.   Diagnostic mammography October of 2011 was again unremarkable except for the fact that she does have very dense breasts, and for that reason, she does not just get screening mammography but diagnostic.  This was repeated May 24, 2011, and this time new calcifications were identified posteriorly within the upper outer quadrant of the left breast.  They were mildly pleomorphic, and breast-specific gamma imaging was repeated and showed increased activity posteriorly in the left breast adjacent to the chest wall, correlating with the area of new microcalcifications noted on mammography.   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  The invasive tumor appeared to be intermediate grade and was positive for E-cadherin There was no evidence of angiolymphatic invasion.  A breast prognostic profile on the invasive tumor showed it to be estrogen receptor positive, progesterone receptor and HER2 negative, with an MIB-1 of 25%.   The patient was referred to Dr. Carolynne Edouard, and bilateral breast MRIs were obtained May 30, 2011.  There was vague asymmetric enhancement in the lateral central aspect of the left breast measuring up to 8 mm.  There were no other suspicious masses, no axillary or internal mammary adenopathy, and a 7 mm lesion in the liver is felt to be likely a cyst or hemangioma. On 06/14/2011 the patient underwent bilateral mastectomies with results as detailed below.  INTERVAL HISTORY: Poet  returns today for followup of her breast cancer. In particular she's developed a pain in the right thigh area that she wanted me to evaluate.  REVIEW OF SYSTEMS: Overall she is doing "fantastic". She is delighted that her hair is growing back. She is walking up to an hour and a half a day with her dog which she really enjoys. She is having some hot flashes at bedtime. These can interrupt her sleep. She is not having any vaginal dryness problems. As far as the pain in the right leg is concerned. This was initially a little bit above the knee, but is now more in the middle of the thigh, more anteriorly than posteriorly,, not superficial, not burning. It "comes and goes", sometimes it feels worse at night. There has not been any leg redness or weakness associated with this. A detailed review of systems was otherwise noncontributory  PAST MEDICAL HISTORY: Past Medical History  Diagnosis Date  . Hypothyroidism   . Collagenous colitis   . Carpal tunnel syndrome of right wrist   . Osteoporosis   . Breast cancer, IDC, Left, ER +, PR -, Her2 -. 06/01/2011  . Breast cancer 05/25/2011    left,inv ductal carcinoma, DCIS, ER+, PR, Her2  -  migraines  PAST SURGICAL HISTORY: Past Surgical History  Procedure Date  . Laparoscopy abdomen diagnostic   . Orif femur fracture   . Orif humerus decompression   . Colonoscopy 2012  . Mastectomy 06/14/2011    Right - prophylactic  . Simple mastectomy w/ sentinel node biopsy 06/14/2011    Left  . Breast surgery 2004    excision left breast fibroadenoma  . Portacath placement 07/26/2011  Procedure: INSERTION PORT-A-CATH;  Surgeon: Currie Paris, MD;  Location: [facility];  Service: General;  Laterality: N/A;  port a cath placement  . Abdominal hysterectomy 2006  . Port-a-cath removal 10/24/2011    Procedure: MINOR REMOVAL PORT-A-CATH;  Surgeon: Currie Paris, MD;  Location: [facility];  Service: General;  Laterality:  Right;  removal of portacath    FAMILY HISTORY Family History  Problem Relation Age of Onset  . Cancer Mother 23    breast  . Cancer Cousin 43    breast  . Heart attack Father   The patient's father died at the age of 11.  Her mother is alive at age 64.  She has 3 brothers, no sisters.  The patient's mother did have breast cancer at the age of 58.  A paternal cousin had breast cancer at the age of 53.  The patient has been tested and is BRCA 1-2 negative  GYNECOLOGIC HISTORY: Menarche age 30.   Simple hysterectomy 2006.  She is GX, P2, 1st pregnancy to term age 73, which of course is a risk factor for breast cancer.  She used birth control pills between 89 and 1974 and took Clomid for fertility between 1983 and 1986.  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  Her husband, [name] (goes by [name]), [place] is a Clinical research associate here in town.  Daughter, [name], 9, lives in [place], [place] where she works as a Education officer, community.  Daughter, [name], 24, is a Gaffer here in [place] in Location manager.   ADVANCED DIRECTIVES: in place  HEALTH MAINTENANCE: History  Substance Use Topics  . Smoking status: Former Smoker -- 0.5 packs/day for 6 years    Types: Cigarettes    Quit date: 07/21/1975  . Smokeless tobacco: Not on file   Comment: smoked in college  . Alcohol Use: No     Colonoscopy: October 2012  PAP: s/p hysterectomy  Bone density: October 2011    Allergies  Allergen Reactions  . Codeine Nausea Only  . Erythromycin Nausea Only  . Nsaids     Hx collagenous colitis  . Penicillins Hives  . Reglan (Metoclopramide Hcl) Diarrhea    Current Outpatient Prescriptions  Medication Sig Dispense Refill  . b complex vitamins tablet Take 1 tablet by mouth daily.      . budesonide (ENTOCORT EC) 3 MG 24 hr capsule       . calcium citrate-vitamin D (CITRACAL+D) 315-200 MG-UNIT per  tablet Take 1 tablet by mouth 4 (four) times daily.      . cholecalciferol (VITAMIN D) 1000 UNITS tablet Take 1,000 Units by mouth daily.        . frovatriptan (FROVA) 2.5 MG tablet Take 2.5 mg by mouth as needed. If recurs, may repeat after 2 hours. Max of 3 tabs in 24 hours.       Marland Kitchen levothyroxine (SYNTHROID, LEVOTHROID) 50 MCG tablet Take 50 mcg by mouth daily.        Marland Kitchen liothyronine (CYTOMEL) 5 MCG tablet Take 5 mcg by mouth daily.        Marland Kitchen LORazepam (ATIVAN) 0.5 MG tablet Take 0.5 mg by mouth as needed.      Marland Kitchen DISCONTD: promethazine (PHENERGAN) 25 MG tablet         OBJECTIVE: Middle-aged white woman who appears well Filed Vitals:   12/12/11 1124  BP: 122/78  Pulse: 69  Temp: 98.4 F (36.9 C)     Body mass index is 20.42 kg/(m^2).    ECOG FS: 1  Sclerae unicteric  Oropharynx clear  No peripheral adenopathy  Lungs no rales or rhonchi  Heart regular rate and rhythm  Abd benign  MSK no focal spinal tenderness, no ankle edema, normal figures of 4 bilaterally; no tenderness to palpation of the right thigh area, no masses palpated, no erythema. The right knee is unremarkable.  Neuro: nonfocal, straight leg raising to 85 bilaterally, no sensory level Breasts: Status post bilateral mastectomies. No evidence of local recurrence     LAB RESULTS: Lab Results  Component Value Date   WBC 3.1* 12/12/2011   NEUTROABS 1.7 12/12/2011   HGB 13.0 12/12/2011   HCT 39.1 12/12/2011   MCV 96.7 12/12/2011   PLT 197 12/12/2011      Chemistry      Component Value Date/Time   NA 140 10/03/2011 1227   K 4.0 10/03/2011 1227   CL 105 10/03/2011 1227   CO2 26 10/03/2011 1227   BUN 12 10/03/2011 1227   CREATININE 0.49* 10/03/2011 1227      Component Value Date/Time   CALCIUM 9.5 10/03/2011 1227   ALKPHOS 80 10/03/2011 1227   AST 19 10/03/2011 1227   ALT 19 10/03/2011 1227   BILITOT 0.3 10/03/2011 1227       Lab Results  Component Value Date   LABCA2 15 06/01/2011     STUDIES: Bone density  obtained at [facility] on 10/19/2011 showed osteopenia, overall, slightly improved since 2011.  ASSESSMENT: 61 year old [place] woman status post bilateral mastectomies October of 2012 for a left sided invasive ductal carcinoma, T1b N0 or Stage IA, grade 2, estrogen receptor 95% positive, progesterone receptor negative, with no HER-2 amplification and an MIB-1 of 26%; Oncotype DX score in the intermediate range, predicting a 19% risk of recurrence with 5 years of tamoxifen; treated in the adjuvant setting with docetaxel/cyclophosphamide x4 completed 10/03/2011; she started letrozole early April 2013  PLAN:  She is doing very well as far as the letrozole is concerned. I suspect the pain in the right leg is going to be musculoskeletal, secondary to her exercise pattern. She does wear comfortable shoes, and exam of the right knee is unremarkable. I've asked her to back up a little on her walking and see if that helps. Of course she could have a mononeuropathy there, but that seems less likely to me.  What is worrying her of course is a metastatic deposit. We have an alkaline phosphatase pending today and I am setting her up for plain films of the right hip. She will call for those results tomorrow. Assuming they are normal, she will return to see [place] again in 3 months, with repeat lab work.  Today we did discuss whether or not to go on bisphosphonates. Which she would like to do is to continue her walking program, and her vitamin D and calcium supplementation, and repeat a bone density in 2 years. She knows to call for any other problems that may develop before the next visit. Camielle Sizer C    12/12/2011

## 2011-12-12 NOTE — Telephone Encounter (Signed)
gave patient appointment for 02-2011 printed out calendar and gave to the patient walked patient over to the radiology department for x-ray of the right femur

## 2011-12-26 ENCOUNTER — Ambulatory Visit: Payer: Commercial Managed Care - PPO | Admitting: Oncology

## 2011-12-26 ENCOUNTER — Other Ambulatory Visit: Payer: Commercial Managed Care - PPO | Admitting: Lab

## 2012-01-03 NOTE — Progress Notes (Signed)
Encounter addended by: Glennie Hawk, RN on: 01/03/2012  2:42 PM<BR>     Documentation filed: Charges VN

## 2012-01-12 ENCOUNTER — Encounter (INDEPENDENT_AMBULATORY_CARE_PROVIDER_SITE_OTHER): Payer: Commercial Managed Care - PPO | Admitting: Surgery

## 2012-01-13 ENCOUNTER — Other Ambulatory Visit: Payer: Self-pay | Admitting: *Deleted

## 2012-01-13 ENCOUNTER — Telehealth: Payer: Self-pay | Admitting: Oncology

## 2012-01-13 DIAGNOSIS — C50419 Malignant neoplasm of upper-outer quadrant of unspecified female breast: Secondary | ICD-10-CM

## 2012-01-13 NOTE — Telephone Encounter (Signed)
S/w the pt and she is aware of her bone scan appt in  june

## 2012-01-13 NOTE — Progress Notes (Signed)
Pt left message stating concern with ongoing R leg discomfort.  Olegario Messier stating due to continued discomfort she is inquiring if appropriate to get bone scan.  This RN called pt and discussed above post review with MD. Order placed for scan and pt will call for results.

## 2012-01-20 ENCOUNTER — Encounter (HOSPITAL_COMMUNITY): Payer: Self-pay

## 2012-01-20 ENCOUNTER — Encounter (HOSPITAL_COMMUNITY)
Admission: RE | Admit: 2012-01-20 | Discharge: 2012-01-20 | Disposition: A | Discharge status: Home / Self Care | Payer: Commercial Managed Care - PPO | Source: Ambulatory Visit | Attending: Oncology | Admitting: Oncology

## 2012-01-20 DIAGNOSIS — S2249XA Multiple fractures of ribs, unspecified side, initial encounter for closed fracture: Secondary | ICD-10-CM | POA: Insufficient documentation

## 2012-01-20 DIAGNOSIS — R079 Chest pain, unspecified: Secondary | ICD-10-CM | POA: Insufficient documentation

## 2012-01-20 DIAGNOSIS — W19XXXA Unspecified fall, initial encounter: Secondary | ICD-10-CM | POA: Insufficient documentation

## 2012-01-20 DIAGNOSIS — C50419 Malignant neoplasm of upper-outer quadrant of unspecified female breast: Secondary | ICD-10-CM

## 2012-01-20 DIAGNOSIS — C50919 Malignant neoplasm of unspecified site of unspecified female breast: Secondary | ICD-10-CM | POA: Insufficient documentation

## 2012-01-20 MED ORDER — TECHNETIUM TC 99M MEDRONATE IV KIT
25.0000 | PACK | Freq: Once | INTRAVENOUS | Status: AC | PRN
  Administered 2012-01-20: 25 via INTRAVENOUS

## 2012-02-21 ENCOUNTER — Telehealth: Payer: Self-pay | Admitting: *Deleted

## 2012-02-21 NOTE — Telephone Encounter (Signed)
This RN spoke with pt per MD review of note for concern with rib fx and healing in relation to current use of letrozole.  Informed pt MD recommendation was letrozole does not interfere with bone healing .  Per call Beth Davis verbalized understanding as well as per discussion interventions for care relating to healing of rib fractures.  Beth Davis understands to call if needed.

## 2012-02-21 NOTE — Telephone Encounter (Signed)
Message left by patient stating onset of pain in known area of rib fractures obtained post fall at end of May while hiking.  Beth Davis did not note pain post fall - area's of injury identified by bone scan done for leg pain ( with negative reading ).  Pt states " I was twisting to talk to someone and felt intense pain " Ribs are now more tender with noted pain.  Beth Davis's concern is " could the letrozole be interfering with bone healing ?" She also asked if she should switch to tamoxifen to allow for better bone re-mineralization.  Return call number given as [phone].

## 2012-02-27 ENCOUNTER — Other Ambulatory Visit (HOSPITAL_BASED_OUTPATIENT_CLINIC_OR_DEPARTMENT_OTHER): Payer: Commercial Managed Care - PPO | Admitting: Lab

## 2012-02-27 DIAGNOSIS — C50912 Malignant neoplasm of unspecified site of left female breast: Secondary | ICD-10-CM

## 2012-02-27 DIAGNOSIS — C50919 Malignant neoplasm of unspecified site of unspecified female breast: Secondary | ICD-10-CM

## 2012-02-27 LAB — CBC WITH DIFFERENTIAL/PLATELET
BASO%: 0.6 % (ref 0.0–2.0)
EOS%: 1.7 % (ref 0.0–7.0)
HCT: 38.6 % (ref 34.8–46.6)
LYMPH%: 27.4 % (ref 14.0–49.7)
MCH: 30.9 pg (ref 25.1–34.0)
MCHC: 33.4 g/dL (ref 31.5–36.0)
NEUT%: 59.6 % (ref 38.4–76.8)
Platelets: 180 10*3/uL (ref 145–400)
RBC: 4.16 10*6/uL (ref 3.70–5.45)
lymph#: 0.9 10*3/uL (ref 0.9–3.3)

## 2012-02-27 LAB — COMPREHENSIVE METABOLIC PANEL
ALT: 13 U/L (ref 0–35)
AST: 16 U/L (ref 0–37)
Creatinine, Ser: 0.69 mg/dL (ref 0.50–1.10)
Total Bilirubin: 0.6 mg/dL (ref 0.3–1.2)

## 2012-03-05 ENCOUNTER — Telehealth: Payer: Self-pay | Admitting: Oncology

## 2012-03-05 ENCOUNTER — Ambulatory Visit (HOSPITAL_BASED_OUTPATIENT_CLINIC_OR_DEPARTMENT_OTHER): Payer: Commercial Managed Care - PPO | Admitting: Oncology

## 2012-03-05 ENCOUNTER — Telehealth: Payer: Self-pay | Admitting: *Deleted

## 2012-03-05 VITALS — BP 111/68 | HR 65 | Temp 98.7°F | Ht 63.0 in | Wt 116.4 lb

## 2012-03-05 DIAGNOSIS — C50919 Malignant neoplasm of unspecified site of unspecified female breast: Secondary | ICD-10-CM

## 2012-03-05 DIAGNOSIS — C50912 Malignant neoplasm of unspecified site of left female breast: Secondary | ICD-10-CM

## 2012-03-05 MED ORDER — GABAPENTIN 300 MG PO CAPS: 300.0000 mg | ORAL_CAPSULE | Freq: Three times a day (TID) | ORAL | Status: DC

## 2012-03-05 NOTE — Telephone Encounter (Signed)
gve the pt her oct 2013 appt calendar. Sent michelle a staff message to add the zometa in for this friday

## 2012-03-05 NOTE — Telephone Encounter (Signed)
Per staff message and POF I have scheduled appt.  JMW  

## 2012-03-05 NOTE — Progress Notes (Signed)
ID: Beth Davis   DOB: 17-Aug-1950  MR#: [id number]  WUJ#:[id number]  HISTORY OF PRESENT ILLNESS: She had diagnostic mammography October of 2010 for bilateral punctate and amorphous microcalcifications, followed by breast-specific gamma imaging that same month which showed no abnormal isotope activity.   Diagnostic mammography October of 2011 was again unremarkable except for the fact that she does have very dense breasts, and for that reason, she does not just get screening mammography but diagnostic.  This was repeated May 24, 2011, and this time new calcifications were identified posteriorly within the upper outer quadrant of the left breast.  They were mildly pleomorphic, and breast-specific gamma imaging was repeated and showed increased activity posteriorly in the left breast adjacent to the chest wall, correlating with the area of new microcalcifications noted on mammography.   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  The invasive tumor appeared to be intermediate grade and was positive for E-cadherin There was no evidence of angiolymphatic invasion.  A breast prognostic profile on the invasive tumor showed it to be estrogen receptor positive, progesterone receptor and HER2 negative, with an MIB-1 of 25%.   The patient was referred to Dr. Carolynne Edouard, and bilateral breast MRIs were obtained May 30, 2011.  There was vague asymmetric enhancement in the lateral central aspect of the left breast measuring up to 8 mm.  There were no other suspicious masses, no axillary or internal mammary adenopathy, and a 7 mm lesion in the liver is felt to be likely a cyst or hemangioma. On 06/14/2011 the patient underwent bilateral mastectomies with results as detailed below.  INTERVAL HISTORY: Beth Davis  returns today for followup of her breast cancer. She has done quite a bit of cognitive therapy on herself, not knowing she was doing it, and has decided that her cancer is not going to be the thing she worries most about. She has done some reading and had some questions, but overall she has reentered her life with surprisingly little disturbance.  REVIEW OF SYSTEMS: She has no arthralgias/myalgias or vaginal dryness problems related to the letrozole. She does have some "night sweats" which do interfere with her sleep. And we talked about gabapentin today. She is having some itching symptoms around the area of her port scar Otherwise a detailed review of systems was entirely negative.  PAST MEDICAL HISTORY: Past Medical History  Diagnosis Date  . Hypothyroidism   . Collagenous colitis   . Carpal tunnel syndrome of right wrist   . Osteoporosis   . Breast cancer, IDC, Left, ER +, PR -, Her2 -. 06/01/2011  . Breast cancer 05/25/2011    left,inv ductal carcinoma, DCIS, ER+, PR, Her2  -  migraines  PAST SURGICAL HISTORY: Past Surgical History  Procedure Date  . Laparoscopy abdomen diagnostic   . Orif femur fracture   . Orif humerus decompression   . Colonoscopy 2012  . Mastectomy 06/14/2011    Right - prophylactic  . Simple mastectomy w/ sentinel node biopsy 06/14/2011    Left  . Breast surgery 2004    excision left breast fibroadenoma  . Portacath placement 07/26/2011    Procedure: INSERTION PORT-A-CATH;  Surgeon: Currie Paris, MD;  Location: [facility];  Service: General;  Laterality: N/A;  port a cath placement  . Abdominal hysterectomy 2006  .  Port-a-cath removal 10/24/2011    Procedure: MINOR REMOVAL PORT-A-CATH;  Surgeon: Currie Paris, MD;  Location: [facility];  Service: General;  Laterality: Right;  removal of portacath    FAMILY HISTORY Family History  Problem Relation Age of Onset  . Cancer Mother 82    breast  . Cancer Cousin 1      breast  . Heart attack Father   The patient's father died at the age of 59.  Her mother is alive at age 20.  She has 3 brothers, no sisters.  The patient's mother did have breast cancer at the age of 31.  A paternal cousin had breast cancer at the age of 88.  The patient has been tested and is BRCA 1-2 negative  GYNECOLOGIC HISTORY: Menarche age 31.   Simple hysterectomy 2006.  She is GX, P2, 1st pregnancy to term age 29, which of course is a risk factor for breast cancer.  She used birth control pills between 75 and 1974 and took Clomid for fertility between 1983 and 1986.  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  Her husband, Beth Davis (goes by Beth Davis), [place] is a Clinical research associate here in town.  Daughter, Beth Davis, 76, lives in [place], [place] where she works as a Education officer, community.  Daughter, Beth Davis, 24, is a Gaffer here in [place] in Location manager.   ADVANCED DIRECTIVES: in place  HEALTH MAINTENANCE: History  Substance Use Topics  . Smoking status: Former Smoker -- 0.5 packs/day for 6 years    Types: Cigarettes    Quit date: 07/21/1975  . Smokeless tobacco: Not on file   Comment: smoked in college  . Alcohol Use: No     Colonoscopy: October 2012  PAP: s/p hysterectomy  Bone density: October 2011    Allergies  Allergen Reactions  . Codeine Nausea Only  . Erythromycin Nausea Only  . Nsaids     Hx collagenous colitis  . Penicillins Hives  . Reglan (Metoclopramide Hcl) Diarrhea    Current Outpatient Prescriptions  Medication Sig Dispense Refill  . calcium citrate-vitamin D (CITRACAL+D) 315-200 MG-UNIT per tablet Take 1 tablet by mouth 4 (four) times daily.      . cholecalciferol (VITAMIN D) 1000 UNITS tablet Take 1,000 Units by mouth daily.        Marland Kitchen levothyroxine (SYNTHROID, LEVOTHROID) 50 MCG tablet Take 50 mcg by mouth daily.        Marland Kitchen liothyronine (CYTOMEL) 5 MCG  tablet Take 5 mcg by mouth daily.        . budesonide (ENTOCORT EC) 3 MG 24 hr capsule       . frovatriptan (FROVA) 2.5 MG tablet Take 2.5 mg by mouth as needed. If recurs, may repeat after 2 hours. Max of 3 tabs in 24 hours.       Marland Kitchen letrozole (FEMARA) 2.5 MG tablet       . LORazepam (ATIVAN) 0.5 MG tablet Take 0.5 mg by mouth as needed.      Marland Kitchen DISCONTD: promethazine (PHENERGAN) 25 MG tablet         OBJECTIVE: Middle-aged white woman who appears well; her hair is coming in a bit earlier than she would like Filed Vitals:   03/05/12 0929  BP: 111/68  Pulse: 65  Temp: 98.7 F (37.1 C)     Body mass index is 20.62 kg/(m^2).    ECOG FS:  0  Sclerae unicteric  Oropharynx clear  No peripheral adenopathy  Lungs no rales or rhonchi  Heart regular rate and rhythm  Abd benign  MSK no focal spinal tenderness,  no peripheral edema Neuro: nonfocal Breasts: Status post bilateral mastectomies. No evidence of local recurrence Skin: The scar from her port is still slightly red/pink, without inflammation, swelling, or unusual tenderness   LAB RESULTS: Lab Results  Component Value Date   WBC 3.4* 02/27/2012   NEUTROABS 2.0 02/27/2012   HGB 12.9 02/27/2012   HCT 38.6 02/27/2012   MCV 92.7 02/27/2012   PLT 180 02/27/2012      Chemistry      Component Value Date/Time   NA 143 02/27/2012 0957   K 3.8 02/27/2012 0957   CL 107 02/27/2012 0957   CO2 29 02/27/2012 0957   BUN 11 02/27/2012 0957   CREATININE 0.69 02/27/2012 0957      Component Value Date/Time   CALCIUM 10.0 02/27/2012 0957   ALKPHOS 64 02/27/2012 0957   AST 16 02/27/2012 0957   ALT 13 02/27/2012 0957   BILITOT 0.6 02/27/2012 0957       Lab Results  Component Value Date   LABCA2 15 06/01/2011     STUDIES: Bone density obtained at [facility] on 10/19/2011 showed osteopenia, overall, slightly improved since 2011.  ASSESSMENT: 61 y.o.   woman status post bilateral mastectomies October of 2012 for a left sided invasive ductal  carcinoma, T1b N0 or Stage IA, grade 2, estrogen receptor 95% positive, progesterone receptor negative, with no HER-2 amplification and an MIB-1 of 26%; Oncotype DX score in the intermediate range, predicting a 19% risk of recurrence with 5 years of tamoxifen; treated in the adjuvant setting with docetaxel/cyclophosphamide x4 completed 10/03/2011; she started letrozole early April 2013  PLAN:  She is tolerating the letrozole well and the plan data will be to continue that for at least 2 years then reassess. She has osteopenia, and we went over her DEXA scan results from earlier this year. She has discussed all this with her daughter, who is a Education officer, community, and who also feels very comfortable with her going on Zometa. She is aware of the possible toxicities side effects and complications. Accordingly we have scheduled Zometa for her later this week.  We then discussed gabapentin. She is going to try 300 mg at bedtime and to see if that helps her sleep with fewer hot flashes. She will let me know if she has any side effects from either of those medications. Otherwise she will see [place] again in 3 months. MAGRINAT,GUSTAV C    03/05/2012

## 2012-03-06 ENCOUNTER — Ambulatory Visit (INDEPENDENT_AMBULATORY_CARE_PROVIDER_SITE_OTHER): Payer: Commercial Managed Care - PPO | Admitting: Surgery

## 2012-03-06 ENCOUNTER — Telehealth: Payer: Self-pay | Admitting: *Deleted

## 2012-03-06 ENCOUNTER — Encounter (INDEPENDENT_AMBULATORY_CARE_PROVIDER_SITE_OTHER): Payer: Self-pay | Admitting: Surgery

## 2012-03-06 VITALS — BP 114/70 | HR 62 | Temp 96.8°F | Resp 16 | Ht 63.0 in | Wt 115.1 lb

## 2012-03-06 DIAGNOSIS — Z189 Retained foreign body fragments, unspecified material: Secondary | ICD-10-CM

## 2012-03-06 NOTE — Telephone Encounter (Signed)
Per voice mail message from the patient, I have moved her appt from Friday to next week. Patient called and given new date and time.   JMW

## 2012-03-06 NOTE — Patient Instructions (Signed)
We will arrange surgery under local anesthesia to remove the portion of the scar that is uncomfortable and see if there is a small retained suture as the cause

## 2012-03-06 NOTE — Progress Notes (Signed)
Chief complaint: Question of suture at old Port-A-Cath site  History of present illness: I removed this patient's Port-A-Cath in March. At that time I only found one Prolene suture. Since then she's had some irritation at the very medial aspect of the scar and is concerned that this is from the suture or some other suture problem hasn't resolved. She hasn't really had any evidence of infection there.  Exam: Vital signs:BP 114/70  Pulse 62  Temp 96.8 F (36 C) (Temporal)  Resp 16  Ht 5\' 3"  (1.6 m)  Wt 115 lb 2 oz (52.22 kg)  BMI 20.39 kg/m2 General: The patient is alert and healthy-appearing Chest wall: The Port-A-Cath site appears well-healed. The right anterior chest. She is very slightly tender at the very medial aspect of the scar. There is a hint of some blue discoloration suggesting perhaps she does have a retained Prolene suture. There is no evidence of infection. The  Impression: Possible retained suture causing discomfort  Plan: Under local anesthesia with size the medial aspect of the scar and see if there is some residual suture this causing this irritation. I told her we may not find anything and this may still be just some scar irritation instead. She would like to go ahead and schedule this so we will set it up at her convenience.

## 2012-03-09 ENCOUNTER — Ambulatory Visit: Payer: Commercial Managed Care - PPO

## 2012-03-12 ENCOUNTER — Encounter (HOSPITAL_BASED_OUTPATIENT_CLINIC_OR_DEPARTMENT_OTHER)
Admission: RE | Disposition: A | Discharge status: Home / Self Care | Payer: Self-pay | Source: Ambulatory Visit | Attending: Surgery

## 2012-03-12 ENCOUNTER — Ambulatory Visit (HOSPITAL_BASED_OUTPATIENT_CLINIC_OR_DEPARTMENT_OTHER)
Admission: RE | Admit: 2012-03-12 | Discharge: 2012-03-12 | Disposition: A | Discharge status: Home / Self Care | Payer: Commercial Managed Care - PPO | Source: Ambulatory Visit | Attending: Surgery | Admitting: Surgery

## 2012-03-12 DIAGNOSIS — T8189XA Other complications of procedures, not elsewhere classified, initial encounter: Secondary | ICD-10-CM

## 2012-03-12 DIAGNOSIS — S31109A Unspecified open wound of abdominal wall, unspecified quadrant without penetration into peritoneal cavity, initial encounter: Secondary | ICD-10-CM

## 2012-03-12 DIAGNOSIS — L905 Scar conditions and fibrosis of skin: Secondary | ICD-10-CM | POA: Insufficient documentation

## 2012-03-12 HISTORY — PX: MASS EXCISION: SHX2000

## 2012-03-12 SURGERY — MINOR EXCISION OF MASS
Anesthesia: LOCAL | Site: Chest | Laterality: Right | Wound class: Clean

## 2012-03-12 MED ORDER — LIDOCAINE-EPINEPHRINE 1 %-1:100000 IJ SOLN
INTRAMUSCULAR | Status: DC | PRN
  Administered 2012-03-12: 5 mL

## 2012-03-12 SURGICAL SUPPLY — 40 items
ADH SKN CLS APL DERMABOND .7 (GAUZE/BANDAGES/DRESSINGS) ×1
APL SKNCLS STERI-STRIP NONHPOA (GAUZE/BANDAGES/DRESSINGS)
BENZOIN TINCTURE PRP APPL 2/3 (GAUZE/BANDAGES/DRESSINGS) IMPLANT
BLADE SURG 15 STRL LF DISP TIS (BLADE) ×1 IMPLANT
BLADE SURG 15 STRL SS (BLADE) ×2
CLOTH BEACON ORANGE TIMEOUT ST (SAFETY) ×2 IMPLANT
DERMABOND ADVANCED (GAUZE/BANDAGES/DRESSINGS) ×1
DERMABOND ADVANCED .7 DNX12 (GAUZE/BANDAGES/DRESSINGS) IMPLANT
DRSG TEGADERM 4X4.75 (GAUZE/BANDAGES/DRESSINGS) IMPLANT
ELECT REM PT RETURN 9FT ADLT (ELECTROSURGICAL)
ELECTRODE REM PT RTRN 9FT ADLT (ELECTROSURGICAL) IMPLANT
GAUZE SPONGE 4X4 12PLY STRL LF (GAUZE/BANDAGES/DRESSINGS) IMPLANT
GAUZE SPONGE 4X4 16PLY XRAY LF (GAUZE/BANDAGES/DRESSINGS) IMPLANT
GLOVE BIO SURGEON STRL SZ7 (GLOVE) ×1 IMPLANT
GLOVE BIOGEL PI IND STRL 7.5 (GLOVE) IMPLANT
GLOVE BIOGEL PI INDICATOR 7.5 (GLOVE) ×1
GLOVE EUDERMIC 7 POWDERFREE (GLOVE) ×3 IMPLANT
GLOVE SKINSENSE NS SZ7.0 (GLOVE) ×1
GLOVE SKINSENSE STRL SZ7.0 (GLOVE) IMPLANT
MARKER SKIN DUAL TIP RULER LAB (MISCELLANEOUS) ×2 IMPLANT
NDL HYPO 25X1 1.5 SAFETY (NEEDLE) ×1 IMPLANT
NDL SAFETY ECLIPSE 18X1.5 (NEEDLE) ×1 IMPLANT
NEEDLE HYPO 18GX1.5 SHARP (NEEDLE) ×2
NEEDLE HYPO 25X1 1.5 SAFETY (NEEDLE) ×2 IMPLANT
NS IRRIG 1000ML POUR BTL (IV SOLUTION) ×1 IMPLANT
PENCIL BUTTON HOLSTER BLD 10FT (ELECTRODE) IMPLANT
STRIP CLOSURE SKIN 1/2X4 (GAUZE/BANDAGES/DRESSINGS) IMPLANT
SUT ETHILON 3 0 PS 1 (SUTURE) IMPLANT
SUT ETHILON 4 0 PS 2 18 (SUTURE) IMPLANT
SUT MNCRL AB 4-0 PS2 18 (SUTURE) ×1 IMPLANT
SUT PROLENE 3 0 PS 2 (SUTURE) IMPLANT
SUT PROLENE 5 0 P 3 (SUTURE) IMPLANT
SUT VIC AB 3-0 FS2 27 (SUTURE) IMPLANT
SUT VIC AB 4-0 BRD 54 (SUTURE) IMPLANT
SUT VIC AB 4-0 P-3 18XBRD (SUTURE) IMPLANT
SUT VIC AB 4-0 P3 18 (SUTURE) ×2
SUT VIC AB 4-0 SH 18 (SUTURE) IMPLANT
SWABSTICK POVIDONE IODINE SNGL (MISCELLANEOUS) ×4 IMPLANT
SYR CONTROL 10ML LL (SYRINGE) ×2 IMPLANT
TOWEL OR 17X24 6PK STRL BLUE (TOWEL DISPOSABLE) IMPLANT

## 2012-03-12 NOTE — Interval H&P Note (Signed)
History and Physical Interval Note:  03/12/2012 12:33 PM  Beth Davis  has presented today for surgery, with the diagnosis of retained suture  The various methods of treatment have been discussed with the patient and family. After consideration of risks, benefits and other options for treatment, the patient has consented to  Procedure(s) (LRB): MINOR EXCISION OF MASS (Right) as a surgical intervention .  The patient's history has been reviewed, patient examined, no change in status, stable for surgery.  I have reviewed the patient's chart and labs.  Questions were answered to the patient's satisfaction.     Pankaj Haack J

## 2012-03-12 NOTE — Interval H&P Note (Signed)
History and Physical Interval Note:  03/12/2012 12:06 PM  Beth Davis  has presented today for surgery, with the diagnosis of retained suture  The various methods of treatment have been discussed with the patient and family. After consideration of risks, benefits and other options for treatment, the patient has consented to  Procedure(s) (LRB): MINOR EXCISION OF MASS (Right) as a surgical intervention .  The patient's history has been reviewed, patient examined, no change in status, stable for surgery.  I have reviewed the patient's chart and labs.  Questions were answered to the patient's satisfaction.     [name] J  As Prolene suture

## 2012-03-12 NOTE — Op Note (Signed)
Beth Davis Feb 07, 1951 [id number] 03/06/2012  Preoperative diagnosis: Possible retained suture in Port-A-Cath site  Postoperative diagnosis: Same, no suture found  Procedure: Exploration of Port-A-Cath incision  Surgeon: Currie Paris, MD, FACS  Assistant: Charissa Bash, MS III  Anesthesia: Local anesthesia, 1% Xylocaine with epinephrine   Clinical History and Indications: This patient had her Port-A-Cath removed a few months ago. At the medial in the incision she can feel something at to her felt like it was taking her. I talked to her was perhaps a small retained Prolene suture it was not removed from her port came out. However I could not palpate one definitely. After discussion of the alternatives with the patient she wished me to explore this area to see if there was a retained suture    Description of Procedure: Saw the patient telling area and confirmed the plans. She is taking do not) anti-not done. The area was anesthetized with 1% Xylocaine with epinephrine. Await 10 minutes. I then opened the medial half of the prior Port-A-Cath incision. I carefully explored the 17th cc to see if there is any retained suture. All I could find was some bands of fibrous scar tissue. I can explored down to the muscle. I excised some the simultaneous fat and scar tissue. After thorough exploration I determine there was no Prolene or other suture material that I could locate.  Incision was then closed with a 3-0 Vicryl, 4-0 Monocryl subcuticular, and Dermabond. Patient tolerated procedure well. Operative findings were discussed with the patient.  Currie Paris, MD, FACS 03/12/2012 1:15 PM

## 2012-03-12 NOTE — H&P (View-Only) (Signed)
Chief complaint: Question of suture at old Port-A-Cath site  History of present illness: I removed this patient's Port-A-Cath in March. At that time I only found one Prolene suture. Since then she's had some irritation at the very medial aspect of the scar and is concerned that this is from the suture or some other suture problem hasn't resolved. She hasn't really had any evidence of infection there.  Exam: Vital signs:BP 114/70  Pulse 62  Temp 96.8 F (36 C) (Temporal)  Resp 16  Ht 5' 3" (1.6 m)  Wt 115 lb 2 oz (52.22 kg)  BMI 20.39 kg/m2 General: The patient is alert and healthy-appearing Chest wall: The Port-A-Cath site appears well-healed. The right anterior chest. She is very slightly tender at the very medial aspect of the scar. There is a hint of some blue discoloration suggesting perhaps she does have a retained Prolene suture. There is no evidence of infection. The  Impression: Possible retained suture causing discomfort  Plan: Under local anesthesia with size the medial aspect of the scar and see if there is some residual suture this causing this irritation. I told her we may not find anything and this may still be just some scar irritation instead. She would like to go ahead and schedule this so we will set it up at her convenience. 

## 2012-03-13 ENCOUNTER — Ambulatory Visit (HOSPITAL_BASED_OUTPATIENT_CLINIC_OR_DEPARTMENT_OTHER): Payer: Commercial Managed Care - PPO

## 2012-03-13 ENCOUNTER — Encounter (HOSPITAL_BASED_OUTPATIENT_CLINIC_OR_DEPARTMENT_OTHER): Payer: Self-pay | Admitting: Surgery

## 2012-03-13 VITALS — BP 102/59 | HR 69 | Temp 97.7°F

## 2012-03-13 DIAGNOSIS — C50919 Malignant neoplasm of unspecified site of unspecified female breast: Secondary | ICD-10-CM

## 2012-03-13 DIAGNOSIS — C50419 Malignant neoplasm of upper-outer quadrant of unspecified female breast: Secondary | ICD-10-CM

## 2012-03-13 DIAGNOSIS — M810 Age-related osteoporosis without current pathological fracture: Secondary | ICD-10-CM

## 2012-03-13 DIAGNOSIS — C50912 Malignant neoplasm of unspecified site of left female breast: Secondary | ICD-10-CM

## 2012-03-13 MED ORDER — ZOLEDRONIC ACID 4 MG/100ML IV SOLN
4.0000 mg | Freq: Once | INTRAVENOUS | Status: AC
  Administered 2012-03-13: 4 mg via INTRAVENOUS
  Filled 2012-03-13: qty 100

## 2012-03-13 NOTE — Patient Instructions (Signed)
[facility] Discharge Instructions for Patients Receiving Chemotherapy  Today you received the following chemotherapy agents Zometa  To help prevent nausea and vomiting after your treatment, we encourage you to take your nausea medication Begin taking it at 7 pm and take it as often as prescribed for the next 24 to 72 hours.   If you develop nausea and vomiting that is not controlled by your nausea medication, call the clinic. If it is after clinic hours your family physician or the after hours number for the clinic or go to the Emergency Department.   BELOW ARE SYMPTOMS THAT SHOULD BE REPORTED IMMEDIATELY:  *FEVER GREATER THAN 100.5 F  *CHILLS WITH OR WITHOUT FEVER  NAUSEA AND VOMITING THAT IS NOT CONTROLLED WITH YOUR NAUSEA MEDICATION  *UNUSUAL SHORTNESS OF BREATH  *UNUSUAL BRUISING OR BLEEDING  TENDERNESS IN MOUTH AND THROAT WITH OR WITHOUT PRESENCE OF ULCERS  *URINARY PROBLEMS  *BOWEL PROBLEMS  UNUSUAL RASH Items with * indicate a potential emergency and should be followed up as soon as possible.  One of the nurses will contact you 24 hours after your treatment. Please let the nurse know about any problems that you may have experienced. Feel free to call the clinic you have any questions or concerns. The clinic phone number is [phone].   I have been informed and understand all the instructions given to me. I know to contact the clinic, my physician, or go to the Emergency Department if any problems should occur. I do not have any questions at this time, but understand that I may call the clinic during office hours   should I have any questions or need assistance in obtaining follow up care.    __________________________________________  _____________  __________ Signature of Patient or Authorized Representative            Date                   Time    __________________________________________ Nurse's Signature    

## 2012-03-23 ENCOUNTER — Encounter (INDEPENDENT_AMBULATORY_CARE_PROVIDER_SITE_OTHER): Payer: Self-pay | Admitting: Surgery

## 2012-03-23 ENCOUNTER — Ambulatory Visit (INDEPENDENT_AMBULATORY_CARE_PROVIDER_SITE_OTHER): Payer: Commercial Managed Care - PPO | Admitting: Surgery

## 2012-03-23 VITALS — BP 96/68 | HR 62 | Temp 97.4°F | Resp 14 | Ht 63.25 in | Wt 114.0 lb

## 2012-03-23 DIAGNOSIS — Z09 Encounter for follow-up examination after completed treatment for conditions other than malignant neoplasm: Secondary | ICD-10-CM

## 2012-03-23 NOTE — Patient Instructions (Signed)
See me again if any further problems

## 2012-03-23 NOTE — Progress Notes (Signed)
Chief complaint: Postop  History of present illness: I explored this patient's Port-A-Cath scar because she felt something "sticking ". No residual suture was found. Some subcutaneous scar tissue was removed. The patient notes that her "sticking "sensation has resolved.  Exam: The wound is healing nicely with no evidence of infection.  Impression: Doing well  Plan: We'll see again as needed

## 2012-04-27 ENCOUNTER — Encounter (INDEPENDENT_AMBULATORY_CARE_PROVIDER_SITE_OTHER): Payer: Self-pay | Admitting: Surgery

## 2012-04-27 ENCOUNTER — Ambulatory Visit (INDEPENDENT_AMBULATORY_CARE_PROVIDER_SITE_OTHER): Payer: Commercial Managed Care - PPO | Admitting: Surgery

## 2012-04-27 VITALS — BP 104/68 | HR 56 | Temp 98.4°F | Resp 16 | Ht 63.0 in | Wt 116.0 lb

## 2012-04-27 DIAGNOSIS — L910 Hypertrophic scar: Secondary | ICD-10-CM

## 2012-04-27 NOTE — Patient Instructions (Addendum)
See me again in four weeks 

## 2012-04-27 NOTE — Progress Notes (Signed)
Chief complaint: Scar pain History of present illness: This patient had a Port-A-Cath removed several months ago. She's continued to have problem in the scar and there was a question that we have left a small piece of Prolene suture behind when the port was removed.the area was explored under local anesthesia no remnant of old suture material was located. She initially improved in terms of the symptoms she is having which included discomfort and nodularity. However she is again having the same symptoms and notes that the scar is raised up and particularly tender at the medial end.  Exam: She appears to have slightly hypertrophic raised scar which is a little bit tender. There is no infection or mass.  Impression: Mildly hypertrophic scar  Plan: Discussed the potential of injecting this with some Kenalog to see if that would improve her situation. The area is otherwise completely normal to examination. The patient was agreeable. I injected 0.5 cc Kenalog

## 2012-05-18 ENCOUNTER — Encounter (INDEPENDENT_AMBULATORY_CARE_PROVIDER_SITE_OTHER): Payer: Self-pay | Admitting: Surgery

## 2012-05-18 ENCOUNTER — Ambulatory Visit (INDEPENDENT_AMBULATORY_CARE_PROVIDER_SITE_OTHER): Payer: Commercial Managed Care - PPO | Admitting: Surgery

## 2012-05-18 VITALS — BP 124/74 | HR 67 | Temp 98.2°F | Resp 18 | Ht 63.0 in | Wt 116.8 lb

## 2012-05-18 DIAGNOSIS — Z9889 Other specified postprocedural states: Secondary | ICD-10-CM

## 2012-05-18 NOTE — Progress Notes (Signed)
Chief complaint: After steroid injection to Port-A-Cath scar  History of present illness: This patient had a fair amount of discomfort in her Port-A-Cath scar. Originally we thought there was a small piece of Prolene causing discomfort but a wound exploration did not locate any foreign body. Initially she improved but then when she called me a few weeks ago had again developed pain in the scar. She seemed to be developing a little hypertrophic scar Genitalia with small amount of steroid. She comes back for followup today. She is completely resolved her discomfort and feels much better.  Exam: The Port-A-Cath scar is healed nicely.  Impression: Resolved scar pain  Plan: Return as needed.

## 2012-05-18 NOTE — Patient Instructions (Signed)
See me again if any problems 

## 2012-05-31 ENCOUNTER — Other Ambulatory Visit (HOSPITAL_BASED_OUTPATIENT_CLINIC_OR_DEPARTMENT_OTHER): Payer: Commercial Managed Care - PPO | Admitting: Lab

## 2012-05-31 ENCOUNTER — Other Ambulatory Visit: Payer: Self-pay | Admitting: *Deleted

## 2012-05-31 DIAGNOSIS — C50919 Malignant neoplasm of unspecified site of unspecified female breast: Secondary | ICD-10-CM

## 2012-05-31 DIAGNOSIS — C50912 Malignant neoplasm of unspecified site of left female breast: Secondary | ICD-10-CM

## 2012-05-31 LAB — CBC WITH DIFFERENTIAL/PLATELET
BASO%: 0.5 % (ref 0.0–2.0)
EOS%: 1.3 % (ref 0.0–7.0)
HCT: 38.5 % (ref 34.8–46.6)
LYMPH%: 25.9 % (ref 14.0–49.7)
MCH: 31.9 pg (ref 25.1–34.0)
MCHC: 34.3 g/dL (ref 31.5–36.0)
MCV: 93 fL (ref 79.5–101.0)
MONO%: 9.8 % (ref 0.0–14.0)
NEUT%: 62.5 % (ref 38.4–76.8)
Platelets: 196 10*3/uL (ref 145–400)

## 2012-05-31 LAB — COMPREHENSIVE METABOLIC PANEL (CC13)
ALT: 17 U/L (ref 0–55)
CO2: 23 mEq/L (ref 22–29)
Creatinine: 0.7 mg/dL (ref 0.6–1.1)
Total Bilirubin: 0.6 mg/dL (ref 0.20–1.20)

## 2012-06-01 LAB — CANCER ANTIGEN 27.29: CA 27.29: 10 U/mL (ref 0–39)

## 2012-06-07 ENCOUNTER — Telehealth: Payer: Self-pay | Admitting: Oncology

## 2012-06-07 ENCOUNTER — Encounter: Payer: Self-pay | Admitting: Physician Assistant

## 2012-06-07 ENCOUNTER — Ambulatory Visit (HOSPITAL_BASED_OUTPATIENT_CLINIC_OR_DEPARTMENT_OTHER): Payer: Commercial Managed Care - PPO | Admitting: Physician Assistant

## 2012-06-07 VITALS — BP 107/68 | HR 69 | Temp 98.1°F | Resp 20 | Ht 63.0 in | Wt 112.9 lb

## 2012-06-07 DIAGNOSIS — C50912 Malignant neoplasm of unspecified site of left female breast: Secondary | ICD-10-CM

## 2012-06-07 DIAGNOSIS — M899 Disorder of bone, unspecified: Secondary | ICD-10-CM

## 2012-06-07 DIAGNOSIS — C50919 Malignant neoplasm of unspecified site of unspecified female breast: Secondary | ICD-10-CM

## 2012-06-07 DIAGNOSIS — M8580 Other specified disorders of bone density and structure, unspecified site: Secondary | ICD-10-CM

## 2012-06-07 NOTE — Progress Notes (Signed)
ID: Beth Davis   DOB: 03-Apr-1951  MR#: [id number]  [name]  HISTORY OF PRESENT ILLNESS: She had diagnostic mammography October of 2010 for bilateral punctate and amorphous microcalcifications, followed by breast-specific gamma imaging that same month which showed no abnormal isotope activity.   Diagnostic mammography October of 2011 was again unremarkable except for the fact that she does have very dense breasts, and for that reason, she does not just get screening mammography but diagnostic.  This was repeated May 24, 2011, and this time new calcifications were identified posteriorly within the upper outer quadrant of the left breast.  They were mildly pleomorphic, and breast-specific gamma imaging was repeated and showed increased activity posteriorly in the left breast adjacent to the chest wall, correlating with the area of new microcalcifications noted on mammography.   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  The invasive tumor appeared to be intermediate grade and was positive for E-cadherin There was no evidence of angiolymphatic invasion.  A breast prognostic profile on the invasive tumor showed it to be estrogen receptor positive, progesterone receptor and HER2 negative, with an MIB-1 of 25%.   The patient was referred to Dr. Carolynne Edouard, and bilateral breast MRIs were obtained May 30, 2011.  There was vague asymmetric enhancement in the lateral central aspect of the left breast measuring up to 8 mm.  There were no other suspicious masses, no axillary or internal mammary adenopathy, and a 7 mm lesion in the liver is felt to be likely a cyst or hemangioma. On 06/14/2011 the patient underwent bilateral mastectomies with results as detailed below.  INTERVAL HISTORY: Beth Davis  returns today for followup of her left breast cancer. She continues on letrozole which she is tolerating fairly well. Fortunately she has had no significant hot flashes since starting on the gabapentin. She denies any increased joint pain. She does, however, feel like the letrozole is "aging her". Her skin is dryer. She has increased vaginal dryness and vaginal atrophy. Her muscle strength is decreased. She feels like she is less "sharp" than she was previously.    REVIEW OF SYSTEMS: Patient denies any recent illnesses and has had no fevers or chills. No rashes or abnormal bleeding. She's eating and drinking well no nausea or change in bowel habits. She denies cough, shortness of breath, or chest pain. She had some postsurgical pain in the area of the mastectomy incisions, but this has decreased significantly with the use of gabapentin. She has no additional myalgias, arthralgias, or bony pain. No peripheral swelling.  A detailed review of systems is otherwise noncontributory.   PAST MEDICAL HISTORY: Past Medical History  Diagnosis Date  . Hypothyroidism   . Collagenous colitis   . Carpal tunnel syndrome of right wrist   . Osteoporosis   . Breast cancer, IDC, Left, ER +, PR -, Her2 -. 06/01/2011  . Breast cancer 05/25/2011    left,inv ductal carcinoma, DCIS, ER+, PR, Her2  -  migraines  PAST SURGICAL HISTORY: Past Surgical History  Procedure Date  . Laparoscopy abdomen diagnostic   . Orif femur fracture   . Orif humerus decompression   . Colonoscopy 2012  . Mastectomy 06/14/2011    Right - prophylactic  . Simple mastectomy w/ sentinel node biopsy 06/14/2011    Left  . Breast surgery 2004  excision left breast fibroadenoma  . Portacath placement 07/26/2011    Procedure: INSERTION PORT-A-CATH;  Surgeon: Currie Paris, MD;  Location:  [facility];  Service: General;  Laterality: N/A;  port a cath placement  . Abdominal hysterectomy 2006  . Port-a-cath removal  10/24/2011    Procedure: MINOR REMOVAL PORT-A-CATH;  Surgeon: Currie Paris, MD;  Location:  SUR[facility]ERY [facility];  Service: General;  Laterality: Right;  removal of portacath  . Mass excision 03/12/2012    Procedure: MINOR EXCISION OF MASS;  Surgeon: Currie Paris, MD;  Location:  SUR[facility]ERY CENTER;  Service: General;  Laterality: Right;  removal part of port-a-cath scar    FAMILY HISTORY Family History  Problem Relation Age of Onset  . Cancer Mother 78    breast  . Cancer Cousin 54    breast  . Heart attack Father   The patient's father died at the age of 28.  Her mother is alive at age 26.  She has 3 brothers, no sisters.  The patient's mother did have breast cancer at the age of 93.  A paternal cousin had breast cancer at the age of 35.  The patient has been tested and is BRCA 1-2 negative  GYNECOLOGIC HISTORY: Menarche age 86.   Simple hysterectomy 2006.  She is GX, P2, 1st pregnancy to term age 89, which of course is a risk factor for breast cancer.  She used birth control pills between 62 and 1974 and took Clomid for fertility between 1983 and 1986.  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  Her husband, Beth Davis (goes by [place]), [place] is a Clinical research associate here in town.  Daughter, [place], 40, lives in [place] she works as a Education officer, community.  Daughter, Beth [place], 24, is a Gaffer here in [place] in Location manager.   ADVANCED DIRECTIVES: in place  HEALTH MAINTENANCE: History  Substance Use Topics  . Smoking status: Former Smoker -- 0.5 packs/day for 6 years    Types: Cigarettes    Quit date: 07/21/1975  . Smokeless tobacco: Never Used   Comment: smoked in college  . Alcohol Use: No     Colonoscopy: October 2012  PAP: s/p hysterectomy  Bone density: October 2011    Allergies  Allergen Reactions  . Erythromycin Nausea Only  .  Penicillins Hives  . Reglan (Metoclopramide Hcl) Diarrhea    Current Outpatient Prescriptions  Medication Sig Dispense Refill  . calcium citrate-vitamin D (CITRACAL+D) 315-200 MG-UNIT per tablet Take 1 tablet by mouth 4 (four) times daily.      . cholecalciferol (VITAMIN D) 1000 UNITS tablet Take 1,000 Units by mouth daily.        . frovatriptan (FROVA) 2.5 MG tablet Take 2.5 mg by mouth as needed. If recurs, may repeat after 2 hours. Max of 3 tabs in 24 hours.       . gabapentin (NEURONTIN) 300 MG capsule Take 1 capsule (300 mg total) by mouth 3 (three) times daily.  90 capsule  12  . letrozole (FEMARA) 2.5 MG tablet       . levothyroxine (SYNTHROID, LEVOTHROID) 50 MCG tablet Take 50 mcg by mouth daily.        Marland Kitchen liothyronine (CYTOMEL) 5 MCG tablet Take 5 mcg by mouth daily.        Marland Kitchen topiramate (TOPAMAX) 25 MG tablet Take 50 mg by mouth  2 (two) times daily.       Marland Kitchen DISCONTD: promethazine (PHENERGAN) 25 MG tablet         OBJECTIVE: Middle-aged white woman who appears well;  Filed Vitals:   06/07/12 1045  BP: 107/68  Pulse: 69  Temp: 98.1 F (36.7 C)  Resp: 20     Body mass index is 20.00 kg/(m^2).    ECOG FS: 0 Filed Weights   06/07/12 1045  Weight: 112 lb 14.4 oz (51.211 kg)    Sclerae unicteric  Oropharynx clear  No peripheral adenopathy  Lungs no rales or rhonchi  Heart regular rate and rhythm  Abd soft, nontender with positive bowel sounds MSK no focal spinal tenderness,  no peripheral edema Neuro: nonfocal, alert and oriented x3 Breasts: Status post bilateral mastectomies. No evidence of local recurrence   LAB RESULTS: Lab Results  Component Value Date   WBC 3.8* 05/31/2012   NEUTROABS 2.4 05/31/2012   HGB 13.2 05/31/2012   HCT 38.5 05/31/2012   MCV 93.0 05/31/2012   PLT 196 05/31/2012      Chemistry      Component Value Date/Time   NA 140 05/31/2012 0959   NA 143 02/27/2012 0957   K 3.9 05/31/2012 0959   K 3.8 02/27/2012 0957   CL 108* 05/31/2012 0959    CL 107 02/27/2012 0957   CO2 23 05/31/2012 0959   CO2 29 02/27/2012 0957   BUN 15.0 05/31/2012 0959   BUN 11 02/27/2012 0957   CREATININE 0.7 05/31/2012 0959   CREATININE 0.69 02/27/2012 0957      Component Value Date/Time   CALCIUM 9.9 05/31/2012 0959   CALCIUM 10.0 02/27/2012 0957   ALKPHOS 61 05/31/2012 0959   ALKPHOS 64 02/27/2012 0957   AST 14 05/31/2012 0959   AST 16 02/27/2012 0957   ALT 17 05/31/2012 0959   ALT 13 02/27/2012 0957   BILITOT 0.60 05/31/2012 0959   BILITOT 0.6 02/27/2012 0957       Lab Results  Component Value Date   LABCA2 10 05/31/2012     STUDIES: [facility] density obtained at [facility] on 10/19/2011 showed osteopenia, overall, slightly improved since 2011.   ASSESSMENT: 61 y.o.  [place] woman   (1)  status post bilateral mastectomies October of 2012 for a left sided invasive ductal carcinoma, T1b N0 or Stage IA, grade 2, estrogen receptor 95% positive, progesterone receptor negative, with no HER-2 amplification and an MIB-1 of 26%; Oncotype DX score in the intermediate range, predicting a 19% risk of recurrence with 5 years of tamoxifen;   (2)  treated in the adjuvant setting with docetaxel/cyclophosphamide x4 completed 10/03/2011;   (3)  She started letrozole early April 2013  PLAN:  Taitiana continues to tolerate letrozole well, and will continue with no change in our plan. I recommended she try vaginal moisturizers such as Replens or coconut oil. We also discussed exercise, which will include not only walking, but perhaps some light weight lifting for muscle strength. She tolerated the zoledronic acid well in July, and we will continue with that agent on an annual basis. Otherwise, she'll return to see [place] in approximately 3 months, but knows to call prior that time with any changes or problems.  Zollie Scale    06/07/2012

## 2012-06-07 NOTE — Telephone Encounter (Signed)
gve the pt her feb 2014 appt calendars

## 2012-07-06 ENCOUNTER — Telehealth: Payer: Self-pay | Admitting: *Deleted

## 2012-07-06 NOTE — Telephone Encounter (Signed)
Patient called.  She would like to change from her AI-letrozole to tamoxifen (as Dr. Darnelle Catalan had originally suggested).  She is participating in the [place] [facility] Study and  One of the researchers told her that the tamoxifen was ok to use and would be better for her bone density.  She also wonders if it may help with the increased frequency of her migraines.  She is due to see Dr. Darnelle Catalan on 09-25-12.  Does she need to come in and discuss the change with him before that or would he just call in the tamoxifen?

## 2012-07-09 ENCOUNTER — Telehealth: Payer: Self-pay | Admitting: *Deleted

## 2012-07-09 NOTE — Telephone Encounter (Signed)
This RN called pt post MD review of call on Friday with his recommendation to stop the letrozole now - wait 2 weeks then start the Tamoxifen.  Spoke with pt who verbalized understanding.

## 2012-07-16 ENCOUNTER — Other Ambulatory Visit: Payer: Self-pay | Admitting: *Deleted

## 2012-07-16 MED ORDER — TAMOXIFEN CITRATE 20 MG PO TABS: 20.0000 mg | ORAL_TABLET | Freq: Every day | ORAL | Status: DC

## 2012-09-18 ENCOUNTER — Other Ambulatory Visit (HOSPITAL_BASED_OUTPATIENT_CLINIC_OR_DEPARTMENT_OTHER): Payer: Commercial Managed Care - PPO | Admitting: Lab

## 2012-09-18 DIAGNOSIS — M899 Disorder of bone, unspecified: Secondary | ICD-10-CM

## 2012-09-18 DIAGNOSIS — M8580 Other specified disorders of bone density and structure, unspecified site: Secondary | ICD-10-CM

## 2012-09-18 DIAGNOSIS — C50919 Malignant neoplasm of unspecified site of unspecified female breast: Secondary | ICD-10-CM

## 2012-09-18 DIAGNOSIS — C50912 Malignant neoplasm of unspecified site of left female breast: Secondary | ICD-10-CM

## 2012-09-18 LAB — COMPREHENSIVE METABOLIC PANEL (CC13)
Albumin: 3.9 g/dL (ref 3.5–5.0)
BUN: 9.4 mg/dL (ref 7.0–26.0)
CO2: 27 mEq/L (ref 22–29)
Calcium: 9.2 mg/dL (ref 8.4–10.4)
Chloride: 106 mEq/L (ref 98–107)
Glucose: 97 mg/dl (ref 70–99)
Potassium: 4.4 mEq/L (ref 3.5–5.1)

## 2012-09-18 LAB — CBC WITH DIFFERENTIAL/PLATELET
Basophils Absolute: 0 10*3/uL (ref 0.0–0.1)
EOS%: 1.4 % (ref 0.0–7.0)
HCT: 38 % (ref 34.8–46.6)
HGB: 12.8 g/dL (ref 11.6–15.9)
MCH: 32 pg (ref 25.1–34.0)
MONO#: 0.4 10*3/uL (ref 0.1–0.9)
NEUT%: 64.1 % (ref 38.4–76.8)
lymph#: 1.3 10*3/uL (ref 0.9–3.3)

## 2012-09-19 LAB — VITAMIN D 25 HYDROXY (VIT D DEFICIENCY, FRACTURES): Vit D, 25-Hydroxy: 30 ng/mL (ref 30–89)

## 2012-09-25 ENCOUNTER — Ambulatory Visit (HOSPITAL_BASED_OUTPATIENT_CLINIC_OR_DEPARTMENT_OTHER): Payer: BC Managed Care – PPO | Admitting: Oncology

## 2012-09-25 ENCOUNTER — Telehealth: Payer: Self-pay | Admitting: Oncology

## 2012-09-25 VITALS — BP 116/95 | HR 62 | Temp 98.2°F | Resp 20 | Ht 63.0 in | Wt 116.0 lb

## 2012-09-25 DIAGNOSIS — Z170 Estrogen receptor positive status [ER+]: Secondary | ICD-10-CM

## 2012-09-25 DIAGNOSIS — C50119 Malignant neoplasm of central portion of unspecified female breast: Secondary | ICD-10-CM

## 2012-09-25 DIAGNOSIS — C50912 Malignant neoplasm of unspecified site of left female breast: Secondary | ICD-10-CM

## 2012-09-25 NOTE — Progress Notes (Signed)
ID: Beth Davis   DOB: September 21, 1950  MR#: [id number]  JYN#:[id number]  HISTORY OF PRESENT ILLNESS: She had diagnostic mammography October of 2010 for bilateral punctate and amorphous microcalcifications, followed by breast-specific gamma imaging that same month which showed no abnormal isotope activity.   Diagnostic mammography October of 2011 was again unremarkable except for the fact that she does have very dense breasts, and for that reason, she does not just get screening mammography but diagnostic.  This was repeated May 24, 2011, and this time new calcifications were identified posteriorly within the upper outer quadrant of the left breast.  They were mildly pleomorphic, and breast-specific gamma imaging was repeated and showed increased activity posteriorly in the left breast adjacent to the chest wall, correlating with the area of new microcalcifications noted on mammography.   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  The invasive tumor appeared to be intermediate grade and was positive for E-cadherin There was no evidence of angiolymphatic invasion.  A breast prognostic profile on the invasive tumor showed it to be estrogen receptor positive, progesterone receptor and HER2 negative, with an MIB-1 of 25%.   The patient was referred to Dr. Carolynne Edouard, and bilateral breast MRIs were obtained May 30, 2011.  There was vague asymmetric enhancement in the lateral central aspect of the left breast measuring up to 8 mm.  There were no other suspicious masses, no axillary or internal mammary adenopathy, and a 7 mm lesion in the liver is felt to be likely a cyst or hemangioma. On 06/14/2011 the patient underwent bilateral mastectomies with results as detailed below.  INTERVAL HISTORY: Beth Davis  returns today for followup of her left breast cancer. She switched from letrozole to tamoxifen in December. She was having problems with vaginal dryness, aches and pains, and just not feeling well. She was off for 2 weeks "and felt great". She then started tamoxifen and over the last couple of months has had less energy, more hot flashes, and again not feeling as well as she thinks she should be feeling. The vaginal dryness problem did get better.  REVIEW OF SYSTEMS: Aside from these issues, she has some sharp stabbing pains in one spot over her left mastectomy scar. This is very intermittent. She has some actinic keratoses and some hip discomfort. Her hot flashes are moderate. She stopped taking the gabapentin because she did not feel it was helping that much. A detailed review of systems today was otherwise noncontributory   PAST MEDICAL HISTORY: Past Medical History  Diagnosis Date  . Hypothyroidism   . Collagenous colitis   . Carpal tunnel syndrome of right wrist   . Osteoporosis   . Breast cancer, IDC, Left, ER +, PR -, Her2 -. 06/01/2011  . Breast cancer 05/25/2011    left,inv ductal carcinoma, DCIS, ER+, PR, Her2  -  migraines  PAST SURGICAL HISTORY: Past Surgical History  Procedure Laterality Date  . Laparoscopy abdomen diagnostic    . Orif femur fracture    . Orif humerus decompression    . Colonoscopy  2012  . Mastectomy  06/14/2011    Right - prophylactic  . Simple mastectomy w/ sentinel node biopsy  06/14/2011    Left  . Breast surgery  2004    excision left breast fibroadenoma  . Portacath placement  07/26/2011  Procedure: INSERTION PORT-A-CATH;  Surgeon: Currie Paris, MD;  Location: [facility];  Service: General;  Laterality: N/A;  port a cath placement  . Abdominal hysterectomy  2006  . Port-a-cath removal  10/24/2011    Procedure: MINOR REMOVAL PORT-A-CATH;  Surgeon: Currie Paris, MD;  Location: [facility];  Service:  General;  Laterality: Right;  removal of portacath  . Mass excision  03/12/2012    Procedure: MINOR EXCISION OF MASS;  Surgeon: Currie Paris, MD;  Location: [facility];  Service: General;  Laterality: Right;  removal part of port-a-cath scar    FAMILY HISTORY Family History  Problem Relation Age of Onset  . Cancer Mother 52    breast  . Cancer Cousin 13    breast  . Heart attack Father   The patient's father died at the age of 64.  Her mother is alive in her 11s.  She has 3 brothers, no sisters.  The patient's mother did have breast cancer at the age of 10.  A paternal cousin had breast cancer at the age of 85.  The patient has been tested and is BRCA 1-2 negative  GYNECOLOGIC HISTORY: Menarche age 27.   Simple hysterectomy 2006.  She is GX, P2, 1st pregnancy to term age 89, which of course is a risk factor for breast cancer.  She used birth control pills between 40 and 1974 and took Clomid for fertility between 1983 and 1986.  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  Her husband, [name] (goes by [name]), [place] is a Clinical research associate here in town.  Daughter, [name],  lives in [place], [place] where she works as a Education officer, community.  Daughter, [name],  is a Gaffer here in [place] in Location manager.   ADVANCED DIRECTIVES: in place  HEALTH MAINTENANCE: History  Substance Use Topics  . Smoking status: Former Smoker -- 0.50 packs/day for 6 years    Types: Cigarettes    Quit date: 07/21/1975  . Smokeless tobacco: Never Used     Comment: smoked in college  . Alcohol Use: No     Colonoscopy: October 2012  PAP: s/p hysterectomy  Bone density: October 2011    Allergies  Allergen Reactions  . Erythromycin Nausea Only  . Penicillins Hives  . Reglan (Metoclopramide Hcl) Diarrhea    Current Outpatient Prescriptions  Medication Sig Dispense Refill  . calcium  citrate-vitamin D (CITRACAL+D) 315-200 MG-UNIT per tablet Take 1 tablet by mouth 4 (four) times daily.      . cholecalciferol (VITAMIN D) 1000 UNITS tablet Take 1,000 Units by mouth daily.        . frovatriptan (FROVA) 2.5 MG tablet Take 2.5 mg by mouth as needed. If recurs, may repeat after 2 hours. Max of 3 tabs in 24 hours.       . gabapentin (NEURONTIN) 300 MG capsule Take 1 capsule (300 mg total) by mouth 3 (three) times daily.  90 capsule  12  . levothyroxine (SYNTHROID, LEVOTHROID) 50 MCG tablet Take 50 mcg by mouth daily.        Marland Kitchen liothyronine (CYTOMEL) 5 MCG tablet Take 5 mcg by mouth daily.        . tamoxifen (NOLVADEX) 20 MG tablet Take 1 tablet (20 mg total) by mouth daily.  30 tablet  12  . topiramate (TOPAMAX) 25 MG tablet Take 50 mg  by mouth 2 (two) times daily.       . [DISCONTINUED] promethazine (PHENERGAN) 25 MG tablet        No current facility-administered medications for this visit.    OBJECTIVE: Middle-aged white woman who appears well;  Filed Vitals:   09/25/12 0923  BP: 116/95  Pulse: 62  Temp: 98.2 F (36.8 C)  Resp: 20     Body mass index is 20.55 kg/(m^2).    ECOG FS: 0 Filed Weights   09/25/12 0923  Weight: 116 lb (52.617 kg)    Sclerae unicteric  Oropharynx clear  No peripheral adenopathy  Lungs no rales or rhonchi  Heart regular rate and rhythm  Abd soft, nontender with positive bowel sounds MSK no focal spinal tenderness,  no peripheral edema Neuro: nonfocal, alert and oriented x3 Breasts: Status post bilateral mastectomies. No evidence of local recurrence. The spot that is tender around the mid point of the left mastectomy scar is unremarkable to inspection and palpation. I do not elicit tenderness by palpation there today    LAB RESULTS: Lab Results  Component Value Date   WBC 5.1 09/18/2012   NEUTROABS 3.3 09/18/2012   HGB 12.8 09/18/2012   HCT 38.0 09/18/2012   MCV 94.9 09/18/2012   PLT 158 09/18/2012      Chemistry      Component Value  Date/Time   NA 142 09/18/2012 1444   NA 143 02/27/2012 0957   K 4.4 09/18/2012 1444   K 3.8 02/27/2012 0957   CL 106 09/18/2012 1444   CL 107 02/27/2012 0957   CO2 27 09/18/2012 1444   CO2 29 02/27/2012 0957   BUN 9.4 09/18/2012 1444   BUN 11 02/27/2012 0957   CREATININE 0.7 09/18/2012 1444   CREATININE 0.69 02/27/2012 0957      Component Value Date/Time   CALCIUM 9.2 09/18/2012 1444   CALCIUM 10.0 02/27/2012 0957   ALKPHOS 59 09/18/2012 1444   ALKPHOS 64 02/27/2012 0957   AST 12 09/18/2012 1444   AST 16 02/27/2012 0957   ALT 10 09/18/2012 1444   ALT 13 02/27/2012 0957   BILITOT 0.35 09/18/2012 1444   BILITOT 0.6 02/27/2012 0957       Lab Results  Component Value Date   LABCA2 9 09/18/2012     STUDIES: Bone density obtained at [facility] on 10/19/2011 showed osteopenia, overall, slightly improved since 2011.   ASSESSMENT: 62 y.o.  [place] woman   (1)  status post bilateral mastectomies October of 2012 for a left sided invasive ductal carcinoma, T1b N0 or Stage IA, grade 2, estrogen receptor 95% positive, progesterone receptor negative, with no HER-2 amplification and an MIB-1 of 26%; Oncotype DX score in the intermediate range, predicting a 19% risk of recurrence with 5 years of tamoxifen;   (2)  treated in the adjuvant setting with docetaxel/cyclophosphamide x4 completed 10/03/2011;   (3)  She started letrozole early April 2013, switched to tamoxifen December 2013  (4) osteopenia, s/p Zometa 03/13/2012  PLAN:  [place] is willing to continue tamoxifen but really is not feeling as well as she wants to feel and I think needs to feel if she is going to be able to tolerate this pill for 5-10 years. We went over the fact that it takes about 4 months to get a steady level of tamoxifen, and also about 4 months to completely eliminate it. Accordingly what I suggested is that she be off tamoxifen for a couple of months and then reassess. If  she is not feeling any better by then, then what we're dealing with  really is menopause and posttraumatic stress and we can go back to tamoxifen at the same dose.  However if she tells me in mid April that she is feeling a lot better then what we would do is start tamoxifen at 10 mg every other day and do that for 2 months before reassessing. We would try to increase the dose to 10 mg daily after that time. We are not going to get her to 20 mg daily without significant side effects, but if she could take 10 mg daily then at least we are halfway there. If all this fails we can always try to exemestane.  She knows to call for any problems that may develop before the next visit.  Marland KitchenMAGRINAT,GUSTAV C    09/25/2012

## 2012-09-25 NOTE — Telephone Encounter (Signed)
gv pt appt schedule for August.  °

## 2012-10-04 ENCOUNTER — Other Ambulatory Visit: Payer: Commercial Managed Care - PPO | Admitting: Lab

## 2012-10-11 ENCOUNTER — Ambulatory Visit: Payer: Commercial Managed Care - PPO | Admitting: Oncology

## 2012-11-06 ENCOUNTER — Telehealth: Payer: Self-pay | Admitting: *Deleted

## 2012-11-06 NOTE — Telephone Encounter (Signed)
Pt is aware of her appt d/t change..gv her new appt d/t.

## 2012-11-22 ENCOUNTER — Telehealth: Payer: Self-pay | Admitting: *Deleted

## 2012-11-22 ENCOUNTER — Other Ambulatory Visit: Payer: Self-pay | Admitting: *Deleted

## 2012-11-22 MED ORDER — ANASTROZOLE 1 MG PO TABS: 1.0000 mg | ORAL_TABLET | Freq: Every day | ORAL | Status: DC

## 2012-11-22 NOTE — Telephone Encounter (Signed)
Pt left message stating she has been off tamoxifen since early Feb - symptoms of severe fatique and other issues of concern " finally went away approximately 2 weeks ago ", " since then I feel so much better and there is a tremondous difference in how I feel ".  Per MD review recommended for pt to start anastrazole.  This RN called pt and discussed above. Prescription escribed to pt's pharmacy.

## 2012-11-28 ENCOUNTER — Ambulatory Visit (HOSPITAL_COMMUNITY)
Admission: RE | Admit: 2012-11-28 | Discharge: 2012-11-28 | Disposition: A | Discharge status: Home / Self Care | Payer: BC Managed Care – PPO | Source: Ambulatory Visit | Attending: Physician Assistant | Admitting: Physician Assistant

## 2012-11-28 ENCOUNTER — Telehealth: Payer: Self-pay | Admitting: *Deleted

## 2012-11-28 ENCOUNTER — Other Ambulatory Visit: Payer: Self-pay | Admitting: *Deleted

## 2012-11-28 DIAGNOSIS — C50919 Malignant neoplasm of unspecified site of unspecified female breast: Secondary | ICD-10-CM | POA: Insufficient documentation

## 2012-11-28 DIAGNOSIS — M948X9 Other specified disorders of cartilage, unspecified sites: Secondary | ICD-10-CM | POA: Insufficient documentation

## 2012-11-28 DIAGNOSIS — C50912 Malignant neoplasm of unspecified site of left female breast: Secondary | ICD-10-CM

## 2012-11-28 DIAGNOSIS — R079 Chest pain, unspecified: Secondary | ICD-10-CM | POA: Insufficient documentation

## 2012-11-28 NOTE — Telephone Encounter (Signed)
Received call from patient stating she has been having some pain in her left lower rib for about a week.    Denies pain with breathing.    Patient states " it only hurts when i touch the area."  Chest xray ordered and patient states she will come this afternoon to have this done.  She will check with [place] tomorrow for results.

## 2012-11-29 ENCOUNTER — Telehealth: Payer: Self-pay | Admitting: *Deleted

## 2012-11-29 NOTE — Telephone Encounter (Signed)
Pt called for results of CXR.  Obtained and discussed including noted area of abnormality correlating with history of trauma. Per discussion pt states she has been participating in activity with repeated " over the head movement ". Per MD can obtain CXR in 6-8 weeks with special attention to abnormal area for follow up.  Above discussed with pt who verbalized understanding and agreement. Pt understands to call if symptoms worsen.

## 2012-12-30 ENCOUNTER — Other Ambulatory Visit: Payer: Self-pay | Admitting: Oncology

## 2013-01-15 ENCOUNTER — Encounter (HOSPITAL_BASED_OUTPATIENT_CLINIC_OR_DEPARTMENT_OTHER): Admission: RE | Payer: Self-pay | Source: Ambulatory Visit

## 2013-01-15 ENCOUNTER — Ambulatory Visit (HOSPITAL_BASED_OUTPATIENT_CLINIC_OR_DEPARTMENT_OTHER)
Admission: RE | Admit: 2013-01-15 | Payer: BC Managed Care – PPO | Source: Ambulatory Visit | Admitting: Orthopedic Surgery

## 2013-01-15 SURGERY — CARPAL TUNNEL RELEASE
Anesthesia: Choice | Site: Wrist | Laterality: Right

## 2013-01-29 ENCOUNTER — Other Ambulatory Visit: Payer: Self-pay | Admitting: Orthopedic Surgery

## 2013-01-31 ENCOUNTER — Encounter (HOSPITAL_BASED_OUTPATIENT_CLINIC_OR_DEPARTMENT_OTHER): Payer: Self-pay | Admitting: *Deleted

## 2013-01-31 NOTE — Progress Notes (Signed)
No labs needed-hx bilat mastectomies-rt snbx-retired rn

## 2013-02-04 NOTE — H&P (Signed)
Beth Davis is an 62 y.o. female.   Chief Complaint: c/o chronic and progressive numbness and tingling of the right hand HPI: Beth Davis returns for follow up evaluation of her right carpal tunnel syndrome symptoms. We have been following Beth Davis for hand numbness symptoms dating back to 2010. Electrodiagnostic studies have revealed mild to moderate carpal tunnel syndrome. She has had a total of 5 prior injections spaced out over a long period of time. She has been wearing her splint but she relates to me that she has been doing a lot of packing and moving of household goods for a remodeling job in her house. This is waking her despite nocturnal splinting and use of anti-inflammatories.  Past Medical History  Diagnosis Date  . Hypothyroidism   . Collagenous colitis   . Carpal tunnel syndrome of right wrist   . Osteoporosis   . Breast cancer, IDC, Left, ER +, PR -, Her2 -. 06/01/2011  . Breast cancer 05/25/2011    left,inv ductal carcinoma, DCIS, ER+, PR, Her2  -  . Wears glasses     Past Surgical History  Procedure Laterality Date  . Laparoscopy abdomen diagnostic    . Orif femur fracture    . Orif humerus decompression    . Colonoscopy  2012  . Mastectomy  06/14/2011    Right - prophylactic  . Simple mastectomy w/ sentinel node biopsy  06/14/2011    Left  . Breast surgery  2004    excision left breast fibroadenoma  . Portacath placement  07/26/2011    Procedure: INSERTION PORT-A-CATH;  Surgeon: Currie Paris, MD;  Location: [facility];  Service: General;  Laterality: N/A;  port a cath placement  . Abdominal hysterectomy  2006  . Port-a-cath removal  10/24/2011    Procedure: MINOR REMOVAL PORT-A-CATH;  Surgeon: Currie Paris, MD;  Location: [facility];  Service: General;  Laterality: Right;  removal of portacath  . Mass excision  03/12/2012    Procedure: MINOR EXCISION OF MASS;  Surgeon: Currie Paris, MD;  Location:  [facility];  Service: General;  Laterality: Right;  removal part of port-a-cath scar    Family History  Problem Relation Age of Onset  . Cancer Mother 77    breast  . Cancer Cousin 25    breast  . Heart attack Father    Social History:  reports that she quit smoking about 37 years ago. Her smoking use included Cigarettes. She has a 3 pack-year smoking history. She has never used smokeless tobacco. She reports that she does not drink alcohol or use illicit drugs.  Allergies:  Allergies  Allergen Reactions  . Erythromycin Nausea Only  . Penicillins Hives  . Reglan (Metoclopramide Hcl) Diarrhea    No prescriptions prior to admission    No results found for this or any previous visit (from the past 48 hour(s)).  No results found.   Pertinent items are noted in HPI.  Height 5\' 3"  (1.6 m), weight 48.535 kg (107 lb).  General appearance: alert Head: Normocephalic, without obvious abnormality Neck: supple, symmetrical, trachea midline Resp: clear to auscultation bilaterally Cardio: regular rate and rhythm GI: WNL Extremities:On exam she has a negative Finkelstein's. She has full ROM of her elbow, wrist and fingers without triggering. She does have a positive Phalen's and Tinel's currently. She has a negative Spurling. She has full ROM of her neck and shoulder.  Pulses: 2+ and symmetric Skin: normal Neurologic: Grossly  normal    Assessment/Plan Impression:Right CTS  Plan: To the OR for right CTR.The procedure, risks,benefits and post-op course were discussed with the patient at length and they were in agreement with the plan.  DASNOIT,Beth Davis 02/04/2013, 2:50 PM   H&P documentation: 02/05/2013  -History and Physical Reviewed  -Patient has been re-examined  -No change in the plan of care  Beth Forster, MD

## 2013-02-05 ENCOUNTER — Ambulatory Visit (HOSPITAL_BASED_OUTPATIENT_CLINIC_OR_DEPARTMENT_OTHER): Payer: BC Managed Care – PPO | Admitting: Anesthesiology

## 2013-02-05 ENCOUNTER — Encounter (HOSPITAL_BASED_OUTPATIENT_CLINIC_OR_DEPARTMENT_OTHER)
Admission: RE | Disposition: A | Discharge status: Home / Self Care | Payer: Self-pay | Source: Ambulatory Visit | Attending: Orthopedic Surgery

## 2013-02-05 ENCOUNTER — Encounter (HOSPITAL_BASED_OUTPATIENT_CLINIC_OR_DEPARTMENT_OTHER): Payer: Self-pay | Admitting: *Deleted

## 2013-02-05 ENCOUNTER — Ambulatory Visit (HOSPITAL_BASED_OUTPATIENT_CLINIC_OR_DEPARTMENT_OTHER)
Admission: RE | Admit: 2013-02-05 | Discharge: 2013-02-05 | Disposition: A | Discharge status: Home / Self Care | Payer: BC Managed Care – PPO | Source: Ambulatory Visit | Attending: Orthopedic Surgery | Admitting: Orthopedic Surgery

## 2013-02-05 ENCOUNTER — Encounter (HOSPITAL_BASED_OUTPATIENT_CLINIC_OR_DEPARTMENT_OTHER): Payer: Self-pay | Admitting: Anesthesiology

## 2013-02-05 DIAGNOSIS — Z881 Allergy status to other antibiotic agents status: Secondary | ICD-10-CM | POA: Insufficient documentation

## 2013-02-05 DIAGNOSIS — M810 Age-related osteoporosis without current pathological fracture: Secondary | ICD-10-CM | POA: Insufficient documentation

## 2013-02-05 DIAGNOSIS — Z888 Allergy status to other drugs, medicaments and biological substances status: Secondary | ICD-10-CM | POA: Insufficient documentation

## 2013-02-05 DIAGNOSIS — E039 Hypothyroidism, unspecified: Secondary | ICD-10-CM | POA: Insufficient documentation

## 2013-02-05 DIAGNOSIS — Z87891 Personal history of nicotine dependence: Secondary | ICD-10-CM | POA: Insufficient documentation

## 2013-02-05 DIAGNOSIS — Z880 Allergy status to penicillin: Secondary | ICD-10-CM | POA: Insufficient documentation

## 2013-02-05 DIAGNOSIS — G5600 Carpal tunnel syndrome, unspecified upper limb: Secondary | ICD-10-CM | POA: Insufficient documentation

## 2013-02-05 DIAGNOSIS — Z9010 Acquired absence of unspecified breast and nipple: Secondary | ICD-10-CM | POA: Insufficient documentation

## 2013-02-05 DIAGNOSIS — Z853 Personal history of malignant neoplasm of breast: Secondary | ICD-10-CM | POA: Insufficient documentation

## 2013-02-05 DIAGNOSIS — K5289 Other specified noninfective gastroenteritis and colitis: Secondary | ICD-10-CM | POA: Insufficient documentation

## 2013-02-05 HISTORY — PX: CARPAL TUNNEL RELEASE: SHX101

## 2013-02-05 HISTORY — DX: Presence of spectacles and contact lenses: Z97.3

## 2013-02-05 LAB — POCT HEMOGLOBIN-HEMACUE: Hemoglobin: 12.4 g/dL (ref 12.0–15.0)

## 2013-02-05 SURGERY — CARPAL TUNNEL RELEASE
Anesthesia: General | Site: Wrist | Laterality: Right | Wound class: Clean

## 2013-02-05 MED ORDER — CHLORHEXIDINE GLUCONATE 4 % EX LIQD: 60.0000 mL | Freq: Once | CUTANEOUS | Status: DC

## 2013-02-05 MED ORDER — LIDOCAINE HCL (CARDIAC) 20 MG/ML IV SOLN
INTRAVENOUS | Status: DC | PRN
  Administered 2013-02-05: 75 mg via INTRAVENOUS

## 2013-02-05 MED ORDER — FENTANYL CITRATE 0.05 MG/ML IJ SOLN
50.0000 ug | INTRAMUSCULAR | Status: DC | PRN
Start: 2013-02-05 — End: 2013-02-05

## 2013-02-05 MED ORDER — ONDANSETRON HCL 4 MG/2ML IJ SOLN: 4.0000 mg | Freq: Once | INTRAMUSCULAR | Status: DC | PRN

## 2013-02-05 MED ORDER — OXYCODONE-ACETAMINOPHEN 5-325 MG PO TABS: ORAL_TABLET | ORAL | Status: DC

## 2013-02-05 MED ORDER — LIDOCAINE HCL 2 % IJ SOLN
INTRAMUSCULAR | Status: DC | PRN
  Administered 2013-02-05: 2 mL

## 2013-02-05 MED ORDER — ACETAMINOPHEN 10 MG/ML IV SOLN: 1000.0000 mg | Freq: Once | INTRAVENOUS | Status: DC | PRN

## 2013-02-05 MED ORDER — LACTATED RINGERS IV SOLN
INTRAVENOUS | Status: DC
  Administered 2013-02-05: 07:00:00 via INTRAVENOUS

## 2013-02-05 MED ORDER — MIDAZOLAM HCL 2 MG/2ML IJ SOLN: 1.0000 mg | INTRAMUSCULAR | Status: DC | PRN

## 2013-02-05 MED ORDER — HYDROMORPHONE HCL PF 1 MG/ML IJ SOLN
0.2500 mg | INTRAMUSCULAR | Status: DC | PRN
  Administered 2013-02-05 (×2): 0.25 mg via INTRAVENOUS

## 2013-02-05 MED ORDER — FENTANYL CITRATE 0.05 MG/ML IJ SOLN
INTRAMUSCULAR | Status: DC | PRN
  Administered 2013-02-05: 50 ug via INTRAVENOUS

## 2013-02-05 MED ORDER — DEXAMETHASONE SODIUM PHOSPHATE 4 MG/ML IJ SOLN
INTRAMUSCULAR | Status: DC | PRN
  Administered 2013-02-05: 10 mg via INTRAVENOUS

## 2013-02-05 MED ORDER — MIDAZOLAM HCL 5 MG/5ML IJ SOLN
INTRAMUSCULAR | Status: DC | PRN
  Administered 2013-02-05: 1 mg via INTRAVENOUS

## 2013-02-05 MED ORDER — PROPOFOL 10 MG/ML IV BOLUS
INTRAVENOUS | Status: DC | PRN
  Administered 2013-02-05: 170 mg via INTRAVENOUS

## 2013-02-05 SURGICAL SUPPLY — 36 items
BANDAGE ADHESIVE 1X3 (GAUZE/BANDAGES/DRESSINGS) IMPLANT
BANDAGE ELASTIC 3 VELCRO ST LF (GAUZE/BANDAGES/DRESSINGS) ×2 IMPLANT
BLADE SURG 15 STRL LF DISP TIS (BLADE) ×1 IMPLANT
BLADE SURG 15 STRL SS (BLADE) ×2
BNDG CMPR 9X4 STRL LF SNTH (GAUZE/BANDAGES/DRESSINGS) ×1
BNDG ESMARK 4X9 LF (GAUZE/BANDAGES/DRESSINGS) ×1 IMPLANT
BRUSH SCRUB EZ PLAIN DRY (MISCELLANEOUS) ×2 IMPLANT
CLOTH BEACON ORANGE TIMEOUT ST (SAFETY) ×2 IMPLANT
CORDS BIPOLAR (ELECTRODE) ×1 IMPLANT
COVER MAYO STAND STRL (DRAPES) ×2 IMPLANT
COVER TABLE BACK 60X90 (DRAPES) ×2 IMPLANT
CUFF TOURNIQUET SINGLE 18IN (TOURNIQUET CUFF) ×1 IMPLANT
DECANTER SPIKE VIAL GLASS SM (MISCELLANEOUS) IMPLANT
DRAPE EXTREMITY T 121X128X90 (DRAPE) ×2 IMPLANT
DRAPE SURG 17X23 STRL (DRAPES) ×2 IMPLANT
GLOVE BIO SURGEON STRL SZ 6.5 (GLOVE) ×1 IMPLANT
GLOVE BIOGEL M STRL SZ7.5 (GLOVE) ×1 IMPLANT
GLOVE ORTHO TXT STRL SZ7.5 (GLOVE) ×2 IMPLANT
GOWN BRE IMP PREV XXLGXLNG (GOWN DISPOSABLE) ×3 IMPLANT
GOWN PREVENTION PLUS XLARGE (GOWN DISPOSABLE) ×2 IMPLANT
NEEDLE 27GAX1X1/2 (NEEDLE) ×1 IMPLANT
PACK BASIN DAY SURGERY FS (CUSTOM PROCEDURE TRAY) ×2 IMPLANT
PAD CAST 3X4 CTTN HI CHSV (CAST SUPPLIES) ×1 IMPLANT
PADDING CAST ABS 4INX4YD NS (CAST SUPPLIES) ×1
PADDING CAST ABS COTTON 4X4 ST (CAST SUPPLIES) ×1 IMPLANT
PADDING CAST COTTON 3X4 STRL (CAST SUPPLIES) ×2
SPLINT PLASTER CAST XFAST 3X15 (CAST SUPPLIES) ×5 IMPLANT
SPLINT PLASTER XTRA FASTSET 3X (CAST SUPPLIES) ×5
SPONGE GAUZE 4X4 12PLY (GAUZE/BANDAGES/DRESSINGS) ×2 IMPLANT
STOCKINETTE 4X48 STRL (DRAPES) ×2 IMPLANT
STRIP CLOSURE SKIN 1/2X4 (GAUZE/BANDAGES/DRESSINGS) ×2 IMPLANT
SUT PROLENE 3 0 PS 2 (SUTURE) ×2 IMPLANT
SYR 3ML 23GX1 SAFETY (SYRINGE) IMPLANT
SYR CONTROL 10ML LL (SYRINGE) ×1 IMPLANT
TRAY DSU PREP LF (CUSTOM PROCEDURE TRAY) ×2 IMPLANT
UNDERPAD 30X30 INCONTINENT (UNDERPADS AND DIAPERS) ×2 IMPLANT

## 2013-02-05 NOTE — Op Note (Cosign Needed)
NAME:  Beth Davis, KLIETHERMES NO.:  [id number]  MEDICAL RECORD NO.:  [id number]  LOCATION:  XRAY                         FACILITY:  [facility]  PHYSICIAN:  Katy Fitch. Anuhea Gassner, M.D. DATE OF BIRTH:  1950/10/14  DATE OF PROCEDURE:  02/05/2013 DATE OF DISCHARGE:  02/05/2013                              OPERATIVE REPORT   PREOPERATIVE DIAGNOSIS:  Chronic right hand numbness with clinical and electrodiagnostic study confirming carpal tunnel syndrome.  POSTOPERATIVE DIAGNOSIS:  Profound ulnar bursitis with likely secondary compression of median nerve.  OPERATION:  Release of right transverse carpal ligament.  OPERATING SURGEON:  Katy Fitch. Cylis Ayars, MD  ASSISTANT:  Surgical technician.  ANESTHESIA:  General by LMA.  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  We have followed her for her neuropathy symptoms and have treated her with splinting, activity modification, and pain management.  She has failed to respond to nonoperative measures.  Recently we had detailed informed consent at the office and advised her to proceed with release of her right transverse carpal ligament to relieve her mechanical nerve compression symptoms of the right wrist.  We had advised her at some point in the future we may need to proceed with imaging of her cervical spine to rule out possible degenerative disk disease, and possible foraminal encroachment on her nerve roots.  Questions regarding the anticipated surgery was invited and answered in detail.  She reported that she was able to tolerate Percocet as a postoperative analgesic.  Ms. Aggarwal was interviewed in the holding area with  her husband present.  Her surgical site was confirmed per protocol.  We discussed pain medications and aftercare.  Questions were invited and answered.  She was interviewed by Dr. Noreene Larsson of anesthesia and selected general anesthesia by LMA.  PROCEDURE:  [name] was brought to room 2 of the [facility] and placed in supine position upon the operating table.  Following the induction of general anesthesia by LMA technique under Dr. Morley Kos direct supervision, the right hand was prepped with Betadine soap and solution, sterilely draped.  A pneumatic tourniquet was applied to the proximal right brachium.  Follow exsanguination of the right arm with Esmarch bandage, arterial tourniquet was inflated to 220 mmHg.  Following routine surgical time- out, procedure commenced with short incision in the line of the ring finger and the palm.  Subcutaneous tissues were carefully divided taking care to identify the palmar fascia.  There was noted to be a slightly aberrant hypothenar muscle.  We carefully searched for a motor branch and did not identify one to this hypothenar muscle.  The distal margin of the transverse carpal ligament was defined followed by subcutaneous undermining of the skin.  The transverse carpal ligament was carefully released clearing the median nerve and tendons from the deep surface of the ligament with a Penfield 4 elevator followed by release of the ligament with scissors extending into the distal forearm.  This widely opened the carpal canal.  No mass or other predicaments were noted.  The ulnar bursa was opaque, very thickened.  I could not identify individual flexor tendons.  Otherwise, no masses were noted.  Bleeding points along the margin of the released ligament were electrocauterized with bipolar current followed by repair of the skin with intradermal 3-0 Prolene suture.  A Steri-Strip was applied followed by infiltration of 2% lidocaine  for postoperative analgesia total of 3 mL.  Ms. Tholl was then placed in compressive dressing with sterile gauze, sterile Webril, and a volar plaster splint maintaining the wrist into 15 degrees of dorsiflexion.  There were no apparent complications.  For aftercare, she is provided prescription for Percocet 5 mg 1 p.o. q.4- 6 hours p.r.n. pain, 20 tablets without refill.     Katy Fitch Delanda Bulluck, M.D.     RVS/MEDQ  D:  02/05/2013  T:  02/05/2013  Job:  [id number]  cc:   [name], FNP

## 2013-02-05 NOTE — Brief Op Note (Signed)
02/05/2013  8:08 AM  PATIENT:  Beth Davis  62 y.o. female  PRE-OPERATIVE DIAGNOSIS:  Right Carpal Tunnel Syndrome  POST-OPERATIVE DIAGNOSIS:  Right Carpal Tunnel Syndrome  PROCEDURE:  Procedure(s): CARPAL TUNNEL RELEASE (Right)  SURGEON:  Surgeon(s) and Role:    * Wyn Forster., MD - Primary  PHYSICIAN ASSISTANT:   ASSISTANTS: surgical technician   ANESTHESIA:   general  EBL:     BLOOD ADMINISTERED:none  DRAINS: none   LOCAL MEDICATIONS USED:  XYLOCAINE   SPECIMEN:  No Specimen  DISPOSITION OF SPECIMEN:  N/A  COUNTS:  YES  TOURNIQUET:   Total Tourniquet Time Documented: Upper Arm (Right) - 9 minutes Total: Upper Arm (Right) - 9 minutes   DICTATION: .Other Dictation: Dictation Number [id number]  PLAN OF CARE: Discharge to home after PACU  PATIENT DISPOSITION:  PACU - hemodynamically stable.   Delay start of Pharmacological VTE agent (>24hrs) due to surgical blood loss or risk of bleeding: not applicable

## 2013-02-05 NOTE — Anesthesia Postprocedure Evaluation (Signed)
  Anesthesia Post-op Note  Patient: Beth Davis  Procedure(s) Performed: Procedure(s): CARPAL TUNNEL RELEASE (Right)  Patient Location: PACU  Anesthesia Type:General  Level of Consciousness: awake, alert  and oriented  Airway and Oxygen Therapy: Patient Spontanous Breathing  Post-op Pain: none  Post-op Assessment: Post-op Vital signs reviewed, Patient's Cardiovascular Status Stable, Respiratory Function Stable, Patent Airway and Pain level controlled  Post-op Vital Signs: stable  Complications: No apparent anesthesia complications

## 2013-02-05 NOTE — Anesthesia Procedure Notes (Signed)
Procedure Name: LMA Insertion Date/Time: 02/05/2013 7:43 AM Performed by: Gar Gibbon Pre-anesthesia Checklist: Patient identified, Emergency Drugs available, Suction available and Patient being monitored Patient Re-evaluated:Patient Re-evaluated prior to inductionOxygen Delivery Method: Circle System Utilized Preoxygenation: Pre-oxygenation with 100% oxygen Intubation Type: IV induction Ventilation: Mask ventilation without difficulty LMA: LMA inserted LMA Size: 3.0 Number of attempts: 1 Airway Equipment and Method: bite block Placement Confirmation: positive ETCO2 Tube secured with: Tape Dental Injury: Teeth and Oropharynx as per pre-operative assessment

## 2013-02-05 NOTE — Op Note (Signed)
[id number] 

## 2013-02-05 NOTE — Anesthesia Preprocedure Evaluation (Signed)
Anesthesia Evaluation  Patient identified by MRN, date of birth, ID band Patient awake    Reviewed: Allergy & Precautions, H&P , NPO status , Patient's Chart, lab work & pertinent test results  Airway Mallampati: I      Dental  (+) Teeth Intact and Dental Advisory Given   Pulmonary  breath sounds clear to auscultation        Cardiovascular Rhythm:Regular Rate:Normal     Neuro/Psych    GI/Hepatic   Endo/Other    Renal/GU      Musculoskeletal   Abdominal   Peds  Hematology   Anesthesia Other Findings   Reproductive/Obstetrics                           Anesthesia Physical Anesthesia Plan  ASA: II  Anesthesia Plan: General   Post-op Pain Management:    Induction: Intravenous  Airway Management Planned: LMA  Additional Equipment:   Intra-op Plan:   Post-operative Plan:   Informed Consent: I have reviewed the patients History and Physical, chart, labs and discussed the procedure including the risks, benefits and alternatives for the proposed anesthesia with the patient or authorized representative who has indicated his/her understanding and acceptance.   Dental advisory given  Plan Discussed with: CRNA, Anesthesiologist and Surgeon  Anesthesia Plan Comments:         Anesthesia Quick Evaluation

## 2013-02-05 NOTE — Op Note (Deleted)
NAME:  Mcdonagh, Sanjana B      ACCOUNT NO.:  [id number]  MEDICAL RECORD NO.:  [id number]  LOCATION:  XRAY                         FACILITY:  [facility]  PHYSICIAN:  Majed Pellegrin V. Platon Arocho, M.D. DATE OF BIRTH:  01/02/1951  DATE OF PROCEDURE:  02/05/2013 DATE OF DISCHARGE:  02/05/2013                              OPERATIVE REPORT   PREOPERATIVE DIAGNOSIS:  Chronic right hand numbness with clinical and electrodiagnostic study confirming carpal tunnel syndrome.  POSTOPERATIVE DIAGNOSIS:  Profound ulnar bursitis with likely secondary compression of median nerve.  OPERATION:  Release of right transverse carpal ligament.  OPERATING SURGEON:  Dollye Glasser V. Keyonta Barradas, MD  ASSISTANT:  Surgical technician.  ANESTHESIA:  General by LMA.  SUPERVISING ANESTHESIOLOGIST:  David C. 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  We have followed her for her neuropathy symptoms and have treated her with splinting, activity modification, and pain management.  She has failed to respond to nonoperative measures.  Recently we had detailed informed consent at the office and advised her to proceed with release of her right transverse carpal ligament to relieve her mechanical nerve compression symptoms of the right wrist.  We had advised her at some point in the future we may need to proceed with imaging of her cervical spine to rule out possible degenerative disk disease, and possible foraminal encroachment on her nerve roots.  Questions regarding the anticipated surgery was invited and answered in detail.  She reported that she was able to tolerate Percocet as a postoperative analgesic.  Ms. Balicki was interviewed in the holding area with  her husband present.  Her surgical site was confirmed per protocol.  We discussed pain medications and aftercare.  Questions were invited and answered.  She was interviewed by Dr. Joslin of anesthesia and selected general anesthesia by LMA.  PROCEDURE:  Laurielle Burgett was brought to room 2 of the [facility] and placed in supine position upon the operating table.  Following the induction of general anesthesia by LMA technique under Dr. Joslin's direct supervision, the right hand was prepped with Betadine soap and solution, sterilely draped.  A pneumatic tourniquet was applied to the proximal right brachium.  Follow exsanguination of the right arm with Esmarch bandage, arterial tourniquet was inflated to 220 mmHg.  Following routine surgical time- out, procedure commenced with short incision in the line of the ring finger and the palm.  Subcutaneous tissues were carefully divided taking care to identify the palmar fascia.  There was noted to be a slightly aberrant hypothenar muscle.  We carefully searched for a motor branch and did not identify one to this hypothenar muscle.  The distal margin of the transverse carpal ligament was defined followed by subcutaneous undermining of the skin.  The transverse carpal ligament was carefully released clearing the median nerve and tendons from the deep surface of the ligament with a Penfield 4 elevator followed by release of the ligament with scissors extending into the distal forearm.  This widely opened the carpal canal.    No mass or other predicaments were noted.  The ulnar bursa was opaque, very thickened.  I could not identify individual flexor tendons.  Otherwise, no masses were noted.  Bleeding points along the margin of the released ligament were electrocauterized with bipolar current followed by repair of the skin with intradermal 3-0 Prolene suture.  A Steri-Strip was applied followed by infiltration of 2% lidocaine  for postoperative analgesia total of 3 mL.  Ms. Kunesh was then placed in compressive dressing with sterile gauze, sterile Webril, and a volar plaster splint maintaining the wrist into 15 degrees of dorsiflexion.  There were no apparent complications.  For aftercare, she is provided prescription for Percocet 5 mg 1 p.o. q.4- 6 hours p.r.n. pain, 20 tablets without refill.     Kaelynn Igo V. Tamia Dial, M.D.     RVS/MEDQ  D:  02/05/2013  T:  02/05/2013  Job:  [id number]  cc:   Teresa Anderson, FNP 

## 2013-02-05 NOTE — Transfer of Care (Signed)
Immediate Anesthesia Transfer of Care Note  Patient: Beth Davis  Procedure(s) Performed: Procedure(s): CARPAL TUNNEL RELEASE (Right)  Patient Location: PACU  Anesthesia Type:General  Level of Consciousness: sedated and patient cooperative  Airway & Oxygen Therapy: Patient Spontanous Breathing and Patient connected to face mask oxygen  Post-op Assessment: Report given to PACU RN and Post -op Vital signs reviewed and stable  Post vital signs: Reviewed and stable  Complications: No apparent anesthesia complications

## 2013-02-06 ENCOUNTER — Encounter (HOSPITAL_BASED_OUTPATIENT_CLINIC_OR_DEPARTMENT_OTHER): Payer: Self-pay | Admitting: Orthopedic Surgery

## 2013-03-11 ENCOUNTER — Other Ambulatory Visit: Payer: Self-pay | Admitting: Physician Assistant

## 2013-03-11 DIAGNOSIS — C50912 Malignant neoplasm of unspecified site of left female breast: Secondary | ICD-10-CM

## 2013-03-12 ENCOUNTER — Other Ambulatory Visit (HOSPITAL_BASED_OUTPATIENT_CLINIC_OR_DEPARTMENT_OTHER): Payer: BC Managed Care – PPO | Admitting: Lab

## 2013-03-12 DIAGNOSIS — C50912 Malignant neoplasm of unspecified site of left female breast: Secondary | ICD-10-CM

## 2013-03-12 DIAGNOSIS — C50919 Malignant neoplasm of unspecified site of unspecified female breast: Secondary | ICD-10-CM

## 2013-03-12 LAB — COMPREHENSIVE METABOLIC PANEL (CC13)
AST: 14 U/L (ref 5–34)
Albumin: 3.9 g/dL (ref 3.5–5.0)
Alkaline Phosphatase: 59 U/L (ref 40–150)
Chloride: 107 mEq/L (ref 98–109)
Glucose: 81 mg/dl (ref 70–140)
Potassium: 3.9 mEq/L (ref 3.5–5.1)
Sodium: 143 mEq/L (ref 136–145)
Total Protein: 6.2 g/dL — ABNORMAL LOW (ref 6.4–8.3)

## 2013-03-12 LAB — CBC WITH DIFFERENTIAL/PLATELET
EOS%: 2.7 % (ref 0.0–7.0)
Eosinophils Absolute: 0.1 10*3/uL (ref 0.0–0.5)
MCV: 94 fL (ref 79.5–101.0)
MONO%: 10.4 % (ref 0.0–14.0)
NEUT#: 1.8 10*3/uL (ref 1.5–6.5)
RBC: 4.12 10*6/uL (ref 3.70–5.45)
RDW: 13.2 % (ref 11.2–14.5)
lymph#: 1.1 10*3/uL (ref 0.9–3.3)

## 2013-03-19 ENCOUNTER — Ambulatory Visit (HOSPITAL_BASED_OUTPATIENT_CLINIC_OR_DEPARTMENT_OTHER): Payer: BC Managed Care – PPO | Admitting: Oncology

## 2013-03-19 ENCOUNTER — Other Ambulatory Visit: Payer: BC Managed Care – PPO

## 2013-03-19 ENCOUNTER — Telehealth: Payer: Self-pay | Admitting: *Deleted

## 2013-03-19 VITALS — BP 102/65 | HR 68 | Temp 98.6°F | Resp 20 | Ht 63.0 in | Wt 107.4 lb

## 2013-03-19 DIAGNOSIS — C50419 Malignant neoplasm of upper-outer quadrant of unspecified female breast: Secondary | ICD-10-CM

## 2013-03-19 DIAGNOSIS — M899 Disorder of bone, unspecified: Secondary | ICD-10-CM

## 2013-03-19 DIAGNOSIS — C50912 Malignant neoplasm of unspecified site of left female breast: Secondary | ICD-10-CM

## 2013-03-19 DIAGNOSIS — Z170 Estrogen receptor positive status [ER+]: Secondary | ICD-10-CM

## 2013-03-19 DIAGNOSIS — C50919 Malignant neoplasm of unspecified site of unspecified female breast: Secondary | ICD-10-CM

## 2013-03-19 MED ORDER — TAMOXIFEN CITRATE 10 MG PO TABS: 10.0000 mg | ORAL_TABLET | Freq: Two times a day (BID) | ORAL | Status: DC

## 2013-03-19 NOTE — Telephone Encounter (Signed)
appts made and printed. Pt is aware that zometa will be added for 04/02/13. i emailed MB to add tx. Pt is aware that i will give her a call with the time of her tx...td

## 2013-03-19 NOTE — Progress Notes (Signed)
ID: Beth Davis   DOB: [birth year]  MR#: [id number]  WUJ#:[id number]  PCP: Beth Palau, FNP GYN: Beth Davis SU:  OTHER MD:   HISTORY OF PRESENT ILLNESS: She had diagnostic mammography October of 2010 for bilateral punctate and amorphous microcalcifications, followed by breast-specific gamma imaging that same month which showed no abnormal isotope activity.   Diagnostic mammography October of 2011 was again unremarkable except for the fact that she does have very dense breasts, and for that reason, she does not just get screening mammography but diagnostic.  This was repeated May 24, 2011, and this time new calcifications were identified posteriorly within the upper outer quadrant of the left breast.  They were mildly pleomorphic, and breast-specific gamma imaging was repeated and showed increased activity posteriorly in the left breast adjacent to the chest wall, correlating with the area of new microcalcifications noted on mammography.   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  The invasive tumor appeared to be intermediate grade and was positive for E-cadherin There was no evidence of angiolymphatic invasion.  A breast prognostic profile on the invasive tumor showed it to be estrogen receptor positive, progesterone receptor and HER2 negative, with an MIB-1 of 25%.   The patient was referred to Beth Davis, and bilateral breast MRIs were obtained May 30, 2011.  There was vague asymmetric enhancement in the lateral central aspect of the left breast measuring up to 8 mm.  There were no other suspicious masses, no axillary or internal mammary adenopathy, and a 7 mm lesion in the liver is felt to be likely a cyst or hemangioma. On 06/14/2011 the patient underwent bilateral  mastectomies with results as detailed below.  INTERVAL HISTORY: Beth Davis returns today for followup of her left breast cancer. She went off tamoxifen in April of 2014 because she was "like an 62 year old woman". She was supposed to have started anastrozole, but she realized he was very similar to letrozole and she did not want to chance it. Accordingly she has been on no treatment now for 4 months. She feels "fantastic".  REVIEW OF SYSTEMS: She exercises regularly, mostly by walking. She had a carpal release under Beth Davis. Otherwise a detailed review of systems today was entirely negative.  PAST MEDICAL HISTORY: Past Medical History  Diagnosis Date  . Hypothyroidism   . Collagenous colitis   . Carpal tunnel syndrome of right wrist   . Osteoporosis   . Breast cancer, IDC, Left, ER +, PR -, Her2 -. 06/01/2011  . Breast cancer 05/25/2011    left,inv ductal carcinoma, DCIS, ER+, PR, Her2  -  . Wears glasses   migraines  PAST SURGICAL HISTORY: Past Surgical History  Procedure Laterality Date  . Laparoscopy abdomen diagnostic    . Orif femur fracture    . Orif humerus decompression    . Colonoscopy  2012  . Mastectomy  06/14/2011    Right - prophylactic  . Simple mastectomy w/ sentinel node biopsy  06/14/2011    Left  . Breast surgery  2004    excision left breast fibroadenoma  . Portacath placement  07/26/2011    Procedure: INSERTION PORT-A-CATH;  Surgeon: Currie Paris, MD;  Location: [facility];  Service: General;  Laterality: N/A;  port a cath placement  . Abdominal hysterectomy  2006  .  Port-a-cath removal  10/24/2011    Procedure: MINOR REMOVAL PORT-A-CATH;  Surgeon: Currie Paris, MD;  Location: [facility];  Service: General;  Laterality: Right;  removal of portacath  . Mass excision  03/12/2012    Procedure: MINOR EXCISION OF MASS;  Surgeon: Currie Paris, MD;  Location: [facility];  Service: General;  Laterality:  Right;  removal part of port-a-cath scar  . Carpal tunnel release Right 02/05/2013    Procedure: CARPAL TUNNEL RELEASE;  Surgeon: Wyn Forster., MD;  Location: [facility];  Service: Orthopedics;  Laterality: Right;    FAMILY HISTORY Family History  Problem Relation Age of Onset  . Cancer Mother 50    breast  . Cancer Cousin 71    breast  . Heart attack Father   The patient's father died at the age of 37.  Her mother is alive in her 56s.  She has 3 brothers, no sisters.  The patient's mother did have breast cancer at the age of 66.  A paternal cousin had breast cancer at the age of 78.  The patient has been tested and is BRCA 1-2 negative  GYNECOLOGIC HISTORY: Menarche age 68.   Simple hysterectomy 2006.  She is GX, P2, 1st pregnancy to term age 27, which of course is a risk factor for breast cancer.  She used birth control pills between 22 and 1974 and took Clomid for fertility between 1983 and 1986.  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  Her husband, Beth Davis (goes by Beth Davis), Beth Davis is a Clinical research associate here in town.  Beth Davis,  lives in [place], [place] where she works as a Education officer, community.  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   ADVANCED DIRECTIVES: in place  HEALTH MAINTENANCE: History  Substance Use Topics  . Smoking status: Former Smoker -- 0.50 packs/day for 6 years    Types: Cigarettes    Quit date: 07/21/1975  . Smokeless tobacco: Never Used     Comment: smoked in college  . Alcohol Use: No     Colonoscopy: October 2012  PAP: s/p hysterectomy  Bone density: October 2011    Allergies  Allergen Reactions  . Erythromycin Nausea Only  . Penicillins Hives  . Reglan (Metoclopramide Hcl) Diarrhea    Current Outpatient Prescriptions  Medication Sig Dispense Refill  . calcium citrate-vitamin D  (CITRACAL+D) 315-200 MG-UNIT per tablet Take 1 tablet by mouth 4 (four) times daily.      . cholecalciferol (VITAMIN D) 1000 UNITS tablet Take 1,000 Units by mouth daily.        . frovatriptan (FROVA) 2.5 MG tablet Take 2.5 mg by mouth as needed. If recurs, may repeat after 2 hours. Max of 3 tabs in 24 hours.       . gabapentin (NEURONTIN) 300 MG capsule TAKE ONE CAPSULE BY MOUTH 3 TIMES A DAY  90 capsule  4  . levothyroxine (SYNTHROID, LEVOTHROID) 50 MCG tablet Take 50 mcg by mouth daily.        Marland Kitchen liothyronine (CYTOMEL) 5 MCG tablet Take 5 mcg by mouth daily.        Marland Kitchen oxyCODONE-acetaminophen (PERCOCET/ROXICET) 5-325 MG per tablet Take one tablet every 4-6 hours as needed for postoperative pain  20 tablet  0  . tamoxifen (NOLVADEX) 10 MG tablet Take 1 tablet (10  mg total) by mouth 2 (two) times daily.  90 tablet  4  . valACYclovir (VALTREX) 1000 MG tablet       . [DISCONTINUED] promethazine (PHENERGAN) 25 MG tablet        No current facility-administered medications for this visit.    OBJECTIVE: Middle-aged white woman in no acute distress Filed Vitals:   03/19/13 0934  BP: 102/65  Pulse: 68  Temp: 98.6 F (37 C)  Resp: 20     Body mass index is 19.03 kg/(m^2).    ECOG FS: 0 Filed Weights   03/19/13 0934  Weight: 107 lb 6.4 oz (48.716 kg)    Sclerae unicteric, pupils equal round and reactive to light Oropharynx clear  No peripheral adenopathy  Lungs no rales or rhonchi  Heart regular rate and rhythm  Abd soft, nontender with positive bowel sounds MSK no focal spinal tenderness,  no peripheral edema Neuro: nonfocal, alert and oriented x3 Breasts: Status post bilateral mastectomies. No evidence of local recurrence. Both axillae are benign   LAB RESULTS: Lab Results  Component Value Date   WBC 3.3* 03/12/2013   NEUTROABS 1.8 03/12/2013   HGB 13.2 03/12/2013   HCT 38.7 03/12/2013   MCV 94.0 03/12/2013   PLT 159 03/12/2013      Chemistry      Component Value Date/Time   NA  143 03/12/2013 0924   NA 143 02/27/2012 0957   K 3.9 03/12/2013 0924   K 3.8 02/27/2012 0957   CL 106 09/18/2012 1444   CL 107 02/27/2012 0957   CO2 27 03/12/2013 0924   CO2 29 02/27/2012 0957   BUN 7.9 03/12/2013 0924   BUN 11 02/27/2012 0957   CREATININE 0.7 03/12/2013 0924   CREATININE 0.69 02/27/2012 0957      Component Value Date/Time   CALCIUM 9.5 03/12/2013 0924   CALCIUM 10.0 02/27/2012 0957   ALKPHOS 59 03/12/2013 0924   ALKPHOS 64 02/27/2012 0957   AST 14 03/12/2013 0924   AST 16 02/27/2012 0957   ALT 12 03/12/2013 0924   ALT 13 02/27/2012 0957   BILITOT 0.52 03/12/2013 0924   BILITOT 0.6 02/27/2012 0957       Lab Results  Component Value Date   LABCA2 9 09/18/2012     STUDIES: Bone density obtained at [facility] on 10/19/2011 showed osteopenia, overall, slightly improved since 2011.   ASSESSMENT: 62 y.o.  [place] woman   (1)  status post bilateral mastectomies October of 2012 for a left sided invasive ductal carcinoma, T1b N0 or Stage IA, grade 2, estrogen receptor 95% positive, progesterone receptor negative, with no HER-2 amplification and an MIB-1 of 26%; Oncotype DX score in the intermediate range, predicting a 19% risk of recurrence with 5 years of tamoxifen;   (2)  treated in the adjuvant setting with docetaxel/cyclophosphamide x4 completed 10/03/2011;   (3)  She started letrozole early April 2013, switched to tamoxifen December 2013, stopped tamoxifen in April 2014; resuming tamoxifen at 10 mg daily August of 2014  (4) osteopenia, s/p Zometa 03/13/2012 and 04/02/2013  PLAN:  Oprah is doing terrific as far as her general health is concerned. Anatomically, her small low-grade node negative tumor should have a very low risk of recurrence. The high Oncotype score (in the upper intermediate range) was a surprise. For this reason I would be more comfortable if she could take anti-estrogens. We're going back to tamoxifen and try a lower dose and see how she tolerates it. We're also  going  to give her a second dose of zolendronate. She will have a repeat bone density March of 2015.   She is going to see me 6 months from now. She knows to call for any problems that may develop before her next visit here.  Marland KitchenMAGRINAT,GUSTAV C    03/19/2013

## 2013-03-20 ENCOUNTER — Telehealth: Payer: Self-pay | Admitting: Emergency Medicine

## 2013-03-20 NOTE — Telephone Encounter (Signed)
Per Dr Darnelle Catalan, instructed patient to take Tamoxifen 10mg  by mouth once daily. Instructed patient to call with any future concerns or questions and to follow up in the office at her next scheduled appointment. Patient verbalized understanding.

## 2013-03-21 ENCOUNTER — Telehealth: Payer: Self-pay | Admitting: *Deleted

## 2013-03-21 NOTE — Telephone Encounter (Signed)
sw pt gv appt for 04/02/13@ 2:45pm. Pt is aware that im going to mail her a letter/avs as well....td

## 2013-03-26 ENCOUNTER — Ambulatory Visit: Payer: BC Managed Care – PPO | Admitting: Oncology

## 2013-03-26 ENCOUNTER — Telehealth: Payer: Self-pay | Admitting: *Deleted

## 2013-03-26 NOTE — Telephone Encounter (Signed)
Pt called to this RN to inquire further per MD recommendation for use of zometa. " to decrease possibility of breast cancer in my bones ".  This RN discussed above with her with Michel stated she has information from a study showing no benefit in patients with prostate cancer who developed bone mets. " I would think the mechanism would be the same so I am just wanting to know more ".  This RN informed her per studies for breast cancer data does show benefit for prevention of bone metastasis in breast cancer - and discussed with her obtaining more specific information from site " [employer] ".  This RN validated pt's inquiry and concern.  Plan at present it to hold zometa currently scheduled for the 19th- Bronson Curb will do further reviews of studies as well as this note will be left for MD review.

## 2013-03-31 ENCOUNTER — Other Ambulatory Visit: Payer: Self-pay | Admitting: Oncology

## 2013-04-02 ENCOUNTER — Ambulatory Visit: Payer: BC Managed Care – PPO

## 2013-06-11 IMAGING — CR DG CHEST 1V PORT
1 series · 1 of 1 positions shown · non-contrast
Comparison: Chest x-ray 06/09/2011.

CLINICAL DATA: Port-A-Cath placement.

PORTABLE CHEST - 1 VIEW

[view not recorded]
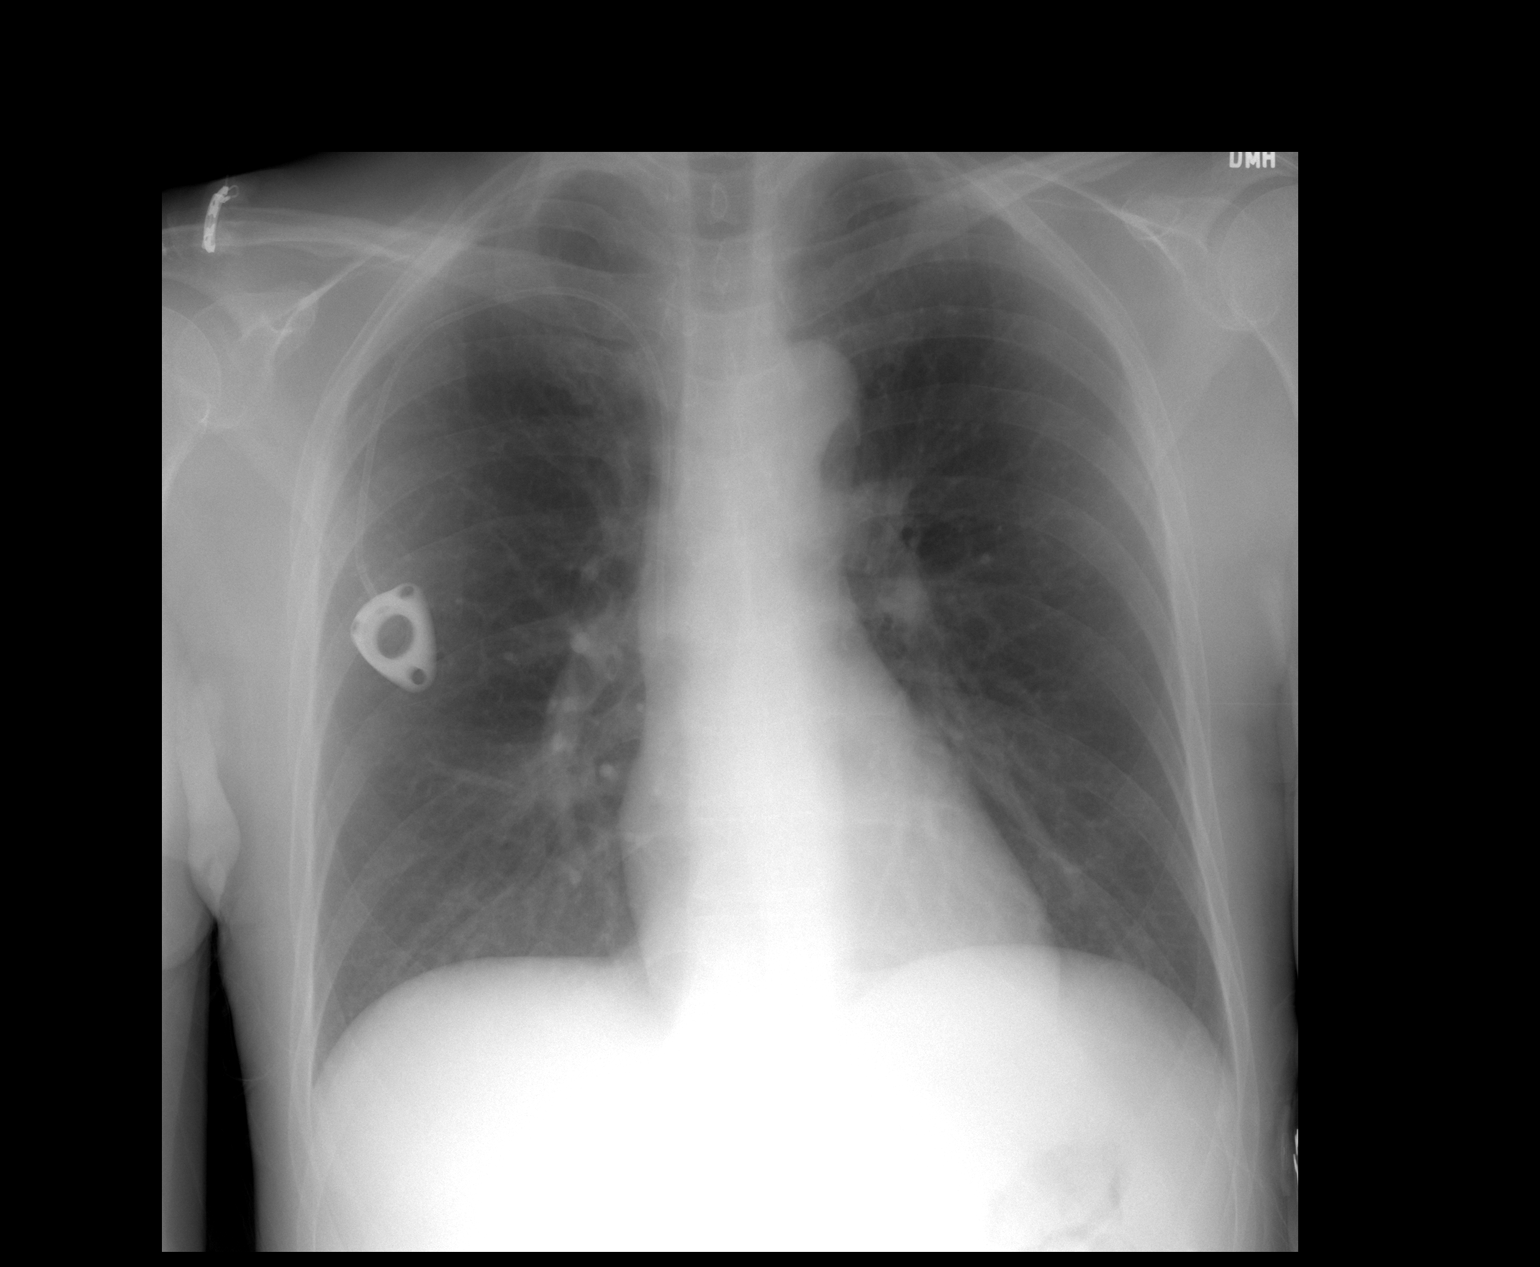

[1 of 1 positions shown; findings below may reference images not displayed]

FINDINGS: The right subclavian Port-A-Cath is in good position.
The tip is in the distal SVC approximately 1 cm above the
cavoatrial junction.  The cardiac silhouette, mediastinal and hilar
contours are normal and stable.  The lungs are clear.  No pulmonary
nodules.  No pneumothorax.  The bony thorax is intact.
IMPRESSION: 1.  Right subclavian Port-A-Cath tip is in the distal SVC.  No
complicating features.
2.  No acute pulmonary findings.

## 2013-06-20 ENCOUNTER — Other Ambulatory Visit: Payer: Self-pay

## 2013-08-10 ENCOUNTER — Other Ambulatory Visit: Payer: Self-pay | Admitting: Oncology

## 2013-09-12 ENCOUNTER — Other Ambulatory Visit: Payer: Self-pay | Admitting: *Deleted

## 2013-09-12 ENCOUNTER — Other Ambulatory Visit (HOSPITAL_BASED_OUTPATIENT_CLINIC_OR_DEPARTMENT_OTHER): Payer: 59

## 2013-09-12 DIAGNOSIS — C50919 Malignant neoplasm of unspecified site of unspecified female breast: Secondary | ICD-10-CM

## 2013-09-12 DIAGNOSIS — C50912 Malignant neoplasm of unspecified site of left female breast: Secondary | ICD-10-CM

## 2013-09-12 LAB — CBC WITH DIFFERENTIAL/PLATELET
BASO%: 1 % (ref 0.0–2.0)
BASOS ABS: 0 10*3/uL (ref 0.0–0.1)
EOS ABS: 0.1 10*3/uL (ref 0.0–0.5)
EOS%: 3 % (ref 0.0–7.0)
HEMATOCRIT: 40.7 % (ref 34.8–46.6)
HEMOGLOBIN: 13.5 g/dL (ref 11.6–15.9)
LYMPH#: 1.3 10*3/uL (ref 0.9–3.3)
LYMPH%: 28.1 % (ref 14.0–49.7)
MCH: 31.4 pg (ref 25.1–34.0)
MCHC: 33.2 g/dL (ref 31.5–36.0)
MCV: 94.6 fL (ref 79.5–101.0)
MONO#: 0.4 10*3/uL (ref 0.1–0.9)
MONO%: 8.5 % (ref 0.0–14.0)
NEUT%: 59.4 % (ref 38.4–76.8)
NEUTROS ABS: 2.8 10*3/uL (ref 1.5–6.5)
PLATELETS: 207 10*3/uL (ref 145–400)
RBC: 4.3 10*6/uL (ref 3.70–5.45)
RDW: 13.1 % (ref 11.2–14.5)
WBC: 4.7 10*3/uL (ref 3.9–10.3)

## 2013-09-12 LAB — COMPREHENSIVE METABOLIC PANEL (CC13)
ALT: 18 U/L (ref 0–55)
ANION GAP: 8 meq/L (ref 3–11)
AST: 15 U/L (ref 5–34)
Albumin: 4.4 g/dL (ref 3.5–5.0)
Alkaline Phosphatase: 64 U/L (ref 40–150)
BILIRUBIN TOTAL: 0.35 mg/dL (ref 0.20–1.20)
BUN: 11.8 mg/dL (ref 7.0–26.0)
CALCIUM: 10 mg/dL (ref 8.4–10.4)
CHLORIDE: 105 meq/L (ref 98–109)
CO2: 28 meq/L (ref 22–29)
CREATININE: 0.7 mg/dL (ref 0.6–1.1)
GLUCOSE: 97 mg/dL (ref 70–140)
Potassium: 4.6 mEq/L (ref 3.5–5.1)
Sodium: 141 mEq/L (ref 136–145)
Total Protein: 6.8 g/dL (ref 6.4–8.3)

## 2013-09-19 ENCOUNTER — Ambulatory Visit (HOSPITAL_BASED_OUTPATIENT_CLINIC_OR_DEPARTMENT_OTHER): Payer: 59 | Admitting: Oncology

## 2013-09-19 ENCOUNTER — Telehealth: Payer: Self-pay | Admitting: Oncology

## 2013-09-19 VITALS — BP 116/74 | HR 61 | Temp 98.2°F | Resp 18 | Ht 63.0 in | Wt 112.6 lb

## 2013-09-19 DIAGNOSIS — M899 Disorder of bone, unspecified: Secondary | ICD-10-CM

## 2013-09-19 DIAGNOSIS — C50919 Malignant neoplasm of unspecified site of unspecified female breast: Secondary | ICD-10-CM

## 2013-09-19 DIAGNOSIS — M949 Disorder of cartilage, unspecified: Secondary | ICD-10-CM

## 2013-09-19 NOTE — Telephone Encounter (Signed)
, °

## 2013-09-19 NOTE — Progress Notes (Signed)
ID: Kennedy Bucker   DOB: 1950/11/05  MR#: [id number]  CSN#:[id number]  PCP: [name], FNP GYN: [name] SU:  OTHER MD:   HISTORY OF PRESENT ILLNESS: She had diagnostic mammography October of 2010 for bilateral punctate and amorphous microcalcifications, followed by breast-specific gamma imaging that same month which showed no abnormal isotope activity.   Diagnostic mammography October of 2011 was again unremarkable except for the fact that she does have very dense breasts, and for that reason, she does not just get screening mammography but diagnostic.  This was repeated May 24, 2011, and this time new calcifications were identified posteriorly within the upper outer quadrant of the left breast.  They were mildly pleomorphic, and breast-specific gamma imaging was repeated and showed increased activity posteriorly in the left breast adjacent to the chest wall, correlating with the area of new microcalcifications noted on mammography.   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  The invasive tumor appeared to be intermediate grade and was positive for E-cadherin There was no evidence of angiolymphatic invasion.  A breast prognostic profile on the invasive tumor showed it to be estrogen receptor positive, progesterone receptor and HER2 negative, with an MIB-1 of 25%.   The patient was referred to Dr. [name], and bilateral breast MRIs were obtained May 30, 2011.  There was vague asymmetric enhancement in the lateral central aspect of the left breast measuring up to 8 mm.  There were no other suspicious masses, no axillary or internal mammary adenopathy, and a 7 mm lesion in the liver is felt to be likely a cyst or hemangioma. On 06/14/2011 the patient underwent bilateral  mastectomies with results as detailed below.  INTERVAL HISTORY: [place] returns today for followup of her left breast cancer. After her August 2014 visit here, she gave tamoxifen a try at 10 mg a day, but after 3 weeks she stopped it. It is made her feel emotional and irritable and not herself. After going off the medication she went back to her usual self and currently "I feel great".  REVIEW OF SYSTEMS: She walks for 45 minutes at a time, between 3 and 5 times a week depending on the weather. She has pinprick-like feelings in the lower right rib cage. It is not clear when these occur, the pattern seems to be random, and certainly not related to meals. She is worried because she had similar pinprick feelings before she had her breast cancer diagnosis. Aside from this a detailed review of systems today was entirely noncontributory  PAST MEDICAL HISTORY: Past Medical History  Diagnosis Date  . Hypothyroidism   . Collagenous colitis   . Carpal tunnel syndrome of right wrist   . Osteoporosis   . Breast cancer, IDC, Left, ER +, PR -, Her2 -. 06/01/2011  . Breast cancer 05/25/2011    left,inv ductal carcinoma, DCIS, ER+, PR, Her2  -  . Wears glasses   migraines  PAST SURGICAL HISTORY: Past Surgical History  Procedure Laterality Date  . Laparoscopy abdomen diagnostic    . Orif femur fracture    . Orif humerus decompression    . Colonoscopy  2012  . Mastectomy  06/14/2011    Right - prophylactic  . Simple mastectomy w/ sentinel node biopsy  06/14/2011    Left  . Breast surgery  2004  excision left breast fibroadenoma  . Portacath placement  07/26/2011    Procedure: INSERTION PORT-A-CATH;  Surgeon: Currie Paris, MD;  Location: [facility];  Service: General;  Laterality: N/A;  port a cath placement  . Abdominal hysterectomy  2006  . Port-a-cath removal  10/24/2011    Procedure: MINOR REMOVAL PORT-A-CATH;  Surgeon: Currie Paris, MD;  Location: [facility];  Service: General;  Laterality: Right;  removal of portacath  . Mass excision  03/12/2012    Procedure: MINOR EXCISION OF MASS;  Surgeon: Currie Paris, MD;  Location: [facility];  Service: General;  Laterality: Right;  removal part of port-a-cath scar  . Carpal tunnel release Right 02/05/2013    Procedure: CARPAL TUNNEL RELEASE;  Surgeon: Wyn Forster., MD;  Location: [facility];  Service: Orthopedics;  Laterality: Right;    FAMILY HISTORY Family History  Problem Relation Age of Onset  . Cancer Mother 28    breast  . Cancer Cousin 41    breast  . Heart attack Father   The patient's father died at the age of 14.  Her mother is alive in her 28s.  She has 3 brothers, no sisters.  The patient's mother did have breast cancer at the age of 58.  A paternal cousin had breast cancer at the age of 33.  The patient has been tested and is BRCA 1-2 negative  GYNECOLOGIC HISTORY: Menarche age 42.   Simple hysterectomy 2006.  She is GX, P2, 1st pregnancy to term age 57, which of course is a risk factor for breast cancer.  She used birth control pills between 73 and 1974 and took Clomid for fertility between 1983 and 1986.  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  Her husband, [name] (goes by [name]), [place] is a Clinical research associate here in town.  Daughter, [name],  lives in [place], [place] where she works as a Education officer, community.  Daughter, [name],  is a Gaffer here in [place] in Location manager.   ADVANCED DIRECTIVES: in place  HEALTH MAINTENANCE: History  Substance Use Topics  . Smoking status: Former Smoker -- 0.50 packs/day for 6 years    Types: Cigarettes    Quit date: 07/21/1975  . Smokeless tobacco: Never Used     Comment: smoked in college  . Alcohol Use: No     Colonoscopy: October 2012  PAP: s/p hysterectomy  Bone density: October  2011    Allergies  Allergen Reactions  . Erythromycin Nausea Only  . Penicillins Hives  . Reglan [Metoclopramide Hcl] Diarrhea    Current Outpatient Prescriptions  Medication Sig Dispense Refill  . calcium citrate-vitamin D (CITRACAL+D) 315-200 MG-UNIT per tablet Take 1 tablet by mouth 4 (four) times daily.      . cholecalciferol (VITAMIN D) 1000 UNITS tablet Take 1,000 Units by mouth daily.        . frovatriptan (FROVA) 2.5 MG tablet Take 2.5 mg by mouth as needed. If recurs, may repeat after 2 hours. Max of 3 tabs in 24 hours.       . gabapentin (NEURONTIN) 300 MG capsule TAKE ONE CAPSULE 3 TIMES A DAY  270 capsule  1  . levothyroxine (SYNTHROID, LEVOTHROID) 50 MCG tablet Take 50 mcg by mouth daily.        Marland Kitchen liothyronine (CYTOMEL) 5 MCG tablet Take 5  mcg by mouth daily.        Marland Kitchen oxyCODONE-acetaminophen (PERCOCET/ROXICET) 5-325 MG per tablet Take one tablet every 4-6 hours as needed for postoperative pain  20 tablet  0  . tamoxifen (NOLVADEX) 10 MG tablet Take 1 tablet (10 mg total) by mouth 2 (two) times daily.  90 tablet  4  . valACYclovir (VALTREX) 1000 MG tablet       . [DISCONTINUED] promethazine (PHENERGAN) 25 MG tablet        No current facility-administered medications for this visit.    OBJECTIVE: Middle-aged white woman who appears well Filed Vitals:   09/19/13 0917  BP: 116/74  Pulse: 61  Temp: 98.2 F (36.8 C)  Resp: 18     Body mass index is 19.95 kg/(m^2).    ECOG FS: 0 Filed Weights   09/19/13 0917  Weight: 112 lb 9.6 oz (51.075 kg)    Sclerae unicteric, pupils equal and round Oropharynx clear and moist No peripheral adenopathy  Lungs no rales or rhonchi  Heart regular rate and rhythm  Abd soft, nontender with positive bowel sounds MSK no focal spinal tenderness,  no peripheral edema; palpation of the right lower rib cage elicits no tenderness and by inspection is entirely normal Neuro: nonfocal, well oriented, pleasant affect Breasts: Status post  bilateral mastectomies. No evidence of local recurrence. Both axillae are benign   LAB RESULTS: Lab Results  Component Value Date   WBC 4.7 09/12/2013   NEUTROABS 2.8 09/12/2013   HGB 13.5 09/12/2013   HCT 40.7 09/12/2013   MCV 94.6 09/12/2013   PLT 207 09/12/2013      Chemistry      Component Value Date/Time   NA 141 09/12/2013 1257   NA 143 02/27/2012 0957   K 4.6 09/12/2013 1257   K 3.8 02/27/2012 0957   CL 106 09/18/2012 1444   CL 107 02/27/2012 0957   CO2 28 09/12/2013 1257   CO2 29 02/27/2012 0957   BUN 11.8 09/12/2013 1257   BUN 11 02/27/2012 0957   CREATININE 0.7 09/12/2013 1257   CREATININE 0.69 02/27/2012 0957      Component Value Date/Time   CALCIUM 10.0 09/12/2013 1257   CALCIUM 10.0 02/27/2012 0957   ALKPHOS 64 09/12/2013 1257   ALKPHOS 64 02/27/2012 0957   AST 15 09/12/2013 1257   AST 16 02/27/2012 0957   ALT 18 09/12/2013 1257   ALT 13 02/27/2012 0957   BILITOT 0.35 09/12/2013 1257   BILITOT 0.6 02/27/2012 0957       Lab Results  Component Value Date   LABCA2 9 09/18/2012     STUDIES: [facility] density [facility] on 10/19/2011 showed osteopenia, overall, slightly improved since 2011.   ASSESSMENT: 63 y.o.  [place] woman   (1)  status post bilateral mastectomies October of 2012 for a left sided invasive ductal carcinoma, T1b N0 or Stage IA, grade 2, estrogen receptor 95% positive, progesterone receptor negative, with no HER-2 amplification and an MIB-1 of 26%;  (2) Oncotype DX score in the intermediate range, predicting a 19% risk of recurrence with 5 years of tamoxifen;   (3)  treated in the adjuvant setting with docetaxel/cyclophosphamide x4 completed 10/03/2011;   (4)  She started letrozole early April 2013, switched to tamoxifen December 2013, stopped tamoxifen in April 2014; resuming tamoxifen at 10 mg daily August of 2014, stopped September 2014 and decided against further anti-estrogen therapy  (5) osteopenia, s/p Zometa 03/13/2012   PLAN:  Rexann has  decided  against any further attempts at antiestrogen therapy. She understands that her anatomic prognosis is very good. The fly in the ointment was the unusually high Oncotype score and that is the reason she received chemotherapy. She understands anti-estrogens in general reduce the risk of recurrence by one half. Despite this, her from decision is that she does not want to expose herself to antiestrogen; that they make her feel like she is not herself; and she is very comfortable with that decision at this point  Similarly, she had been scheduled for a second zolendronate dose, but canceled that. We are following up with this DEXA scan in April of this year.  Today we also discussed followup issues. We generally follow patients for 5 years. The data for that is poor--in other worse we don't have comparative study Z. releasing patients at 2 years is better or worse aren't different. The reason for choosing 5 of course is the fact that more than half of the recurrences will have occurred within the first 5 years.  After much discussion we decided I would see her on a once a year basis until she completes 5 years of followup, which will be 2 years from now. We will do lab work as before. She will call with any problems that may develop before her next visit here. Marland KitchenMAGRINAT,GUSTAV C    09/19/2013

## 2013-11-25 ENCOUNTER — Other Ambulatory Visit: Payer: Self-pay | Admitting: Dermatology

## 2014-09-18 ENCOUNTER — Other Ambulatory Visit (HOSPITAL_BASED_OUTPATIENT_CLINIC_OR_DEPARTMENT_OTHER): Payer: 59

## 2014-09-18 DIAGNOSIS — Z853 Personal history of malignant neoplasm of breast: Secondary | ICD-10-CM

## 2014-09-18 DIAGNOSIS — C50919 Malignant neoplasm of unspecified site of unspecified female breast: Secondary | ICD-10-CM

## 2014-09-18 LAB — COMPREHENSIVE METABOLIC PANEL (CC13)
ALBUMIN: 4.1 g/dL (ref 3.5–5.0)
ALT: 14 U/L (ref 0–55)
AST: 15 U/L (ref 5–34)
Alkaline Phosphatase: 49 U/L (ref 40–150)
Anion Gap: 9 mEq/L (ref 3–11)
BILIRUBIN TOTAL: 0.54 mg/dL (ref 0.20–1.20)
BUN: 10.1 mg/dL (ref 7.0–26.0)
CALCIUM: 9.3 mg/dL (ref 8.4–10.4)
CHLORIDE: 105 meq/L (ref 98–109)
CO2: 29 meq/L (ref 22–29)
Creatinine: 0.8 mg/dL (ref 0.6–1.1)
EGFR: 85 mL/min/{1.73_m2} — AB (ref >90)
GLUCOSE: 85 mg/dL (ref 70–140)
Potassium: 4.3 mEq/L (ref 3.5–5.1)
Sodium: 143 mEq/L (ref 136–145)
TOTAL PROTEIN: 6.1 g/dL — AB (ref 6.4–8.3)

## 2014-09-18 LAB — CBC WITH DIFFERENTIAL/PLATELET
BASO%: 0.6 % (ref 0.0–2.0)
BASOS ABS: 0 10*3/uL (ref 0.0–0.1)
EOS%: 1.8 % (ref 0.0–7.0)
Eosinophils Absolute: 0.1 10*3/uL (ref 0.0–0.5)
HCT: 39.4 % (ref 34.8–46.6)
HEMOGLOBIN: 13 g/dL (ref 11.6–15.9)
LYMPH#: 1.1 10*3/uL (ref 0.9–3.3)
LYMPH%: 32.4 % (ref 14.0–49.7)
MCH: 30.9 pg (ref 25.1–34.0)
MCHC: 32.9 g/dL (ref 31.5–36.0)
MCV: 94 fL (ref 79.5–101.0)
MONO#: 0.4 10*3/uL (ref 0.1–0.9)
MONO%: 10.9 % (ref 0.0–14.0)
NEUT#: 1.9 10*3/uL (ref 1.5–6.5)
NEUT%: 54.3 % (ref 38.4–76.8)
Platelets: 191 10*3/uL (ref 145–400)
RBC: 4.2 10*6/uL (ref 3.70–5.45)
RDW: 12.5 % (ref 11.2–14.5)
WBC: 3.5 10*3/uL — AB (ref 3.9–10.3)

## 2014-09-25 ENCOUNTER — Ambulatory Visit (HOSPITAL_BASED_OUTPATIENT_CLINIC_OR_DEPARTMENT_OTHER): Payer: 59 | Admitting: Oncology

## 2014-09-25 ENCOUNTER — Telehealth: Payer: Self-pay | Admitting: *Deleted

## 2014-09-25 ENCOUNTER — Telehealth: Payer: Self-pay | Admitting: Oncology

## 2014-09-25 VITALS — BP 115/44 | HR 63 | Temp 98.4°F | Resp 18 | Ht 63.0 in | Wt 119.6 lb

## 2014-09-25 DIAGNOSIS — Z170 Estrogen receptor positive status [ER+]: Secondary | ICD-10-CM

## 2014-09-25 DIAGNOSIS — C50912 Malignant neoplasm of unspecified site of left female breast: Secondary | ICD-10-CM

## 2014-09-25 DIAGNOSIS — M8580 Other specified disorders of bone density and structure, unspecified site: Secondary | ICD-10-CM

## 2014-09-25 DIAGNOSIS — C50812 Malignant neoplasm of overlapping sites of left female breast: Secondary | ICD-10-CM

## 2014-09-25 MED ORDER — GABAPENTIN 300 MG PO CAPS: 300.0000 mg | ORAL_CAPSULE | Freq: Every day | ORAL | Status: DC

## 2014-09-25 NOTE — Telephone Encounter (Signed)
, °

## 2014-09-25 NOTE — Telephone Encounter (Signed)
Per staff message and POF I have scheduled appts. Advised scheduler of appts. JMW  

## 2014-09-25 NOTE — Progress Notes (Signed)
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   Diagnostic mammography October of 2011 was again unremarkable except for the fact that she does have very dense breasts, and for that reason, she does not just get screening mammography but diagnostic.  This was repeated May 24, 2011, and this time new calcifications were identified posteriorly within the upper outer quadrant of the left breast.  They were mildly pleomorphic, and breast-specific gamma imaging was repeated and showed increased activity posteriorly in the left breast adjacent to the chest wall, correlating with the area of new microcalcifications noted on mammography.   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  The invasive tumor appeared to be intermediate grade and was positive for E-cadherin There was no evidence of angiolymphatic invasion.  A breast prognostic profile on the invasive tumor showed it to be estrogen receptor positive, progesterone receptor and HER2 negative, with an Beth Davis of 25%.   The patient was referred to Dr. Marlou Davis, and bilateral breast MRIs were obtained May 30, 2011.  There was vague asymmetric enhancement in the lateral central aspect of the left breast measuring up to 8 mm.  There were no other suspicious masses, no axillary or  internal mammary adenopathy, and a 7 mm lesion in the liver is felt to be likely a cyst or hemangioma. On 06/14/2011 the patient underwent bilateral mastectomies with results as detailed below.  INTERVAL HISTORY: Beth Davis returns today for follow-up of her early stage breast cancer. The interval history is generally unremarkable. They have a first grandchild and she spent several months in [place] enjoying herself with the baby.  REVIEW OF SYSTEMS: She continues to experience chest wall tightness. This is related to her prior bilateral mastectomies. When she does do stretching exercises it feels a little better but as soon as she stops it gets tighter. This makes her uncomfortable especially at night. If she takes gabapentin at bedtime, that helps her sleep, reduces the nighttime hot flashes, and also helps with the tightness discomfort. She would like to go back on that medication, which she had discontinued. She is also very concerned about her bone density. We discussed that at length today. Aside from data detailed review of systems today was noncontributory  PAST MEDICAL HISTORY: Past Medical History  Diagnosis Date  . Hypothyroidism   . Collagenous colitis   . Carpal tunnel syndrome of right wrist   . Osteoporosis   . Breast cancer, IDC, Left, ER +, PR -, Her2 -. 06/01/2011  . Breast cancer 05/25/2011    left,inv ductal carcinoma, DCIS, ER+, PR, Her2  -  . Wears glasses   migraines  PAST SURGICAL HISTORY: Past Surgical History  Procedure Laterality Date  . Laparoscopy abdomen diagnostic    . Orif femur fracture    . Orif humerus decompression    . Colonoscopy  2012  . Mastectomy  06/14/2011    Right - prophylactic  .  Simple mastectomy w/ sentinel node biopsy  06/14/2011    Left  . Breast surgery  2004    excision left breast fibroadenoma  . Portacath placement  07/26/2011    Procedure: INSERTION PORT-A-CATH;  Surgeon: Beth Paris, MD;  Location: [facility];  Service: General;  Laterality: N/A;  port a cath placement  . Abdominal hysterectomy  2006  . Port-a-cath removal  10/24/2011    Procedure: MINOR REMOVAL PORT-A-CATH;  Surgeon: Beth Paris, MD;  Location: [facility];  Service: General;  Laterality: Right;  removal of portacath  . Mass excision  03/12/2012    Procedure: MINOR EXCISION OF MASS;  Surgeon: Beth Paris, MD;  Location: [facility];  Service: General;  Laterality: Right;  removal part of port-a-cath scar  . Carpal tunnel release Right 02/05/2013    Procedure: CARPAL TUNNEL RELEASE;  Surgeon: Wyn Forster., MD;  Location:  SUR[facility]ERY C[facility];  Service: Orthopedics;  Laterality: Right;    FAMILY HISTORY Family History  Problem Relation Age of Onset  . Cancer Mother 61    breast  . Cancer Cousin 16    breast  . Heart attack Father   The patient's father died at the age of 1.  Her mother is alive in her 102s.  She has 3 brothers, no sisters.  The patient's mother did have breast cancer at the age of 40.  A paternal cousin had breast cancer at the age of 37.  The patient has been tested and is BRCA 1-2 negative  GYNECOLOGIC HISTORY: Menarche age 46.   Simple hysterectomy 2006.  She is GX, P2, 1st pregnancy to term age 60, which of course is a risk factor for breast cancer.  She used birth control pills between 24 and 1974 and took Clomid for fertility between 1983 and 1986.  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  Her husband, Beth Davis (goes by Beth Davis), Beth Davis is a Clinical research associate here in town.  Daughter, Beth Davis,  lives in [place], [place] where she works as a Education officer, community.  Daughter, Beth Davis,  is a Gaffer here in [place] in Location manager.   ADVANCED DIRECTIVES: in place  HEALTH MAINTENANCE: History  Substance Use Topics  . Smoking status: Former Smoker -- 0.50  packs/day for 6 years    Types: Cigarettes    Quit date: 07/21/1975  . Smokeless tobacco: Never Used     Comment: smoked in college  . Alcohol Use: No     Colonoscopy: October 2012  PAP: s/p hysterectomy  Bone density: October 2011    Allergies  Allergen Reactions  . Erythromycin Nausea Only  . Penicillins Hives  . Reglan [Metoclopramide Hcl] Diarrhea    Current Outpatient Prescriptions  Medication Sig Dispense Refill  . calcium citrate-vitamin D (CITRACAL+D) 315-200 MG-UNIT per tablet Take 1 tablet by mouth 4 (four) times daily.    . cholecalciferol (VITAMIN D) 1000 UNITS tablet Take 1,000 Units by mouth daily.      . frovatriptan (FROVA) 2.5 MG tablet Take 2.5 mg by mouth as needed. If recurs, may repeat after 2 hours. Max of 3 tabs in 24 hours.     . gabapentin (NEURONTIN) 300 MG capsule Take 1 capsule (300 mg total) by mouth at bedtime. 90 capsule 4  . levothyroxine (SYNTHROID, LEVOTHROID) 50 MCG tablet Take 50 mcg by mouth  daily.      . [DISCONTINUED] promethazine (PHENERGAN) 25 MG tablet      No current facility-administered medications for this visit.    OBJECTIVE: Middle-aged white woman who appears well Filed Vitals:   09/25/14 0924  BP: 115/44  Pulse: 63  Temp: 98.4 F (36.9 C)  Resp: 18     Body mass index is 21.19 kg/(m^2).    ECOG FS: 1 Filed Weights   09/25/14 0924  Weight: 119 lb 9.6 oz (54.25 kg)    Sclerae unicteric, pupils equal and reactive Oropharynx clear, teeth in excellent repair No cervical or supraclavicular adenopathy Lungs no rales or rhonchi Heart regular rate and rhythm Abd soft, nontender, positive bowel sounds MSK no focal spinal tenderness, no upper extremity lymphedema Neuro: nonfocal, well oriented, appropriate affect Breasts: Status post bilateral mastectomies. There is no evidence of chest wall recurrence. Both axillae are benign.    LAB RESULTS: Lab Results  Component Value Date   WBC 3.5* 09/18/2014   NEUTROABS 1.9  09/18/2014   HGB 13.0 09/18/2014   HCT 39.4 09/18/2014   MCV 94.0 09/18/2014   PLT 191 09/18/2014      Chemistry      Component Value Date/Time   NA 143 09/18/2014 0930   NA 143 02/27/2012 0957   K 4.3 09/18/2014 0930   K 3.8 02/27/2012 0957   CL 106 09/18/2012 1444   CL 107 02/27/2012 0957   CO2 29 09/18/2014 0930   CO2 29 02/27/2012 0957   BUN 10.1 09/18/2014 0930   BUN 11 02/27/2012 0957   CREATININE 0.8 09/18/2014 0930   CREATININE 0.69 02/27/2012 0957      Component Value Date/Time   CALCIUM 9.3 09/18/2014 0930   CALCIUM 10.0 02/27/2012 0957   ALKPHOS 49 09/18/2014 0930   ALKPHOS 64 02/27/2012 0957   AST 15 09/18/2014 0930   AST 16 02/27/2012 0957   ALT 14 09/18/2014 0930   ALT 13 02/27/2012 0957   BILITOT 0.54 09/18/2014 0930   BILITOT 0.6 02/27/2012 0957       Lab Results  Component Value Date   LABCA2 9 09/18/2012     STUDIES: Bone density obtained at [facility] on April 2015 showed osteopenia, stable, with a T score of -2.0   ASSESSMENT: 64 y.o.  [place] woman   (1)  status post bilateral mastectomies October of 2012 for a left sided invasive ductal carcinoma, T1b N0 or Stage IA, grade 2, estrogen receptor 95% positive, progesterone receptor negative, with no HER-2 amplification and an Beth Davis of 26%;  (2) Oncotype DX score in the intermediate range, predicting a 19% risk of recurrence with 5 years of tamoxifen;   (3)  treated in the adjuvant setting with docetaxel/cyclophosphamide x4 completed 10/03/2011;   (4)  She started letrozole early April 2013, switched to tamoxifen December 2013, stopped tamoxifen in April 2014; resuming tamoxifen at 10 mg daily August of 2014, stopped September 2014 and decided against further anti-estrogen therapy  (5) osteopenia, s/p Zometa 03/13/2012, decided against further bisphosphonate treatment  (a) bone density 11/19/2013 at [facility] [place] showed a T score of -2.0 (stable)  PLAN:  Katerin is doing terrific from a breast  cancer point of view. She does have some chest wall tightness do to her prior surgeries. Anytime she stops doing her stretching exercises that is going to become more of an issue. Unfortunately it is chronic. My recommendation is that she consider tai chi. I did give her a Counsellor and she can  get some extra exercise there as well as some advice on stretching issues.  We then discussed her bone density. It is stable. Nevertheless there is significant osteopenia. She has discussed zolendronate with her dentist daughter and at this point is considering going back on it. She tolerated her first dose 2 years ago with no side effects. Likely that is the reason her bone density was read as "stable" last April.  After much discussion we agreed on her receiving another zolendronate dose, which will be given next week. She will have a repeat bone density next April. I will see her next May to discuss those results and we can decide then whether she needs any further zolendronate doses.  Otherwise she would like to go back on gabapentin at bedtime, which helps her sleep and reduces her nighttime hot flashes. I have refilled that for her.  She knows to call for any problems that may develop before her next visit here. Chauncey Cruel, MD    09/25/2014

## 2014-09-26 ENCOUNTER — Telehealth: Payer: Self-pay | Admitting: Oncology

## 2014-09-26 NOTE — Addendum Note (Signed)
Addended by: Laureen Abrahams on: 09/26/2014 05:01 PM   Modules accepted: Medications

## 2014-09-26 NOTE — Telephone Encounter (Signed)
, °

## 2014-09-29 ENCOUNTER — Telehealth: Payer: Self-pay | Admitting: Oncology

## 2014-10-01 ENCOUNTER — Ambulatory Visit (HOSPITAL_BASED_OUTPATIENT_CLINIC_OR_DEPARTMENT_OTHER): Payer: 59

## 2014-10-01 DIAGNOSIS — C50812 Malignant neoplasm of overlapping sites of left female breast: Secondary | ICD-10-CM

## 2014-10-01 DIAGNOSIS — C50419 Malignant neoplasm of upper-outer quadrant of unspecified female breast: Secondary | ICD-10-CM

## 2014-10-01 DIAGNOSIS — C50919 Malignant neoplasm of unspecified site of unspecified female breast: Secondary | ICD-10-CM

## 2014-10-01 DIAGNOSIS — M8580 Other specified disorders of bone density and structure, unspecified site: Secondary | ICD-10-CM

## 2014-10-01 DIAGNOSIS — C50912 Malignant neoplasm of unspecified site of left female breast: Secondary | ICD-10-CM

## 2014-10-01 MED ORDER — ZOLEDRONIC ACID 4 MG/100ML IV SOLN
4.0000 mg | Freq: Once | INTRAVENOUS | Status: AC
  Administered 2014-10-01: 4 mg via INTRAVENOUS
  Filled 2014-10-01: qty 100

## 2014-10-01 MED ORDER — SODIUM CHLORIDE 0.9 % IV SOLN
Freq: Once | INTRAVENOUS | Status: AC
  Administered 2014-10-01: 09:00:00 via INTRAVENOUS

## 2014-10-01 NOTE — Patient Instructions (Signed)

## 2014-10-04 ENCOUNTER — Other Ambulatory Visit: Payer: Self-pay | Admitting: Oncology

## 2014-10-09 ENCOUNTER — Other Ambulatory Visit: Payer: Self-pay | Admitting: Nurse Practitioner

## 2014-11-27 ENCOUNTER — Other Ambulatory Visit: Payer: Self-pay | Admitting: Gynecology

## 2014-12-01 LAB — CYTOLOGY - PAP

## 2014-12-11 ENCOUNTER — Other Ambulatory Visit (HOSPITAL_BASED_OUTPATIENT_CLINIC_OR_DEPARTMENT_OTHER): Payer: 59

## 2014-12-11 ENCOUNTER — Telehealth: Payer: Self-pay | Admitting: *Deleted

## 2014-12-11 DIAGNOSIS — C50912 Malignant neoplasm of unspecified site of left female breast: Secondary | ICD-10-CM

## 2014-12-11 DIAGNOSIS — M8580 Other specified disorders of bone density and structure, unspecified site: Secondary | ICD-10-CM

## 2014-12-11 DIAGNOSIS — C50812 Malignant neoplasm of overlapping sites of left female breast: Secondary | ICD-10-CM

## 2014-12-11 LAB — CBC WITH DIFFERENTIAL/PLATELET
BASO%: 0.9 % (ref 0.0–2.0)
BASOS ABS: 0 10*3/uL (ref 0.0–0.1)
EOS ABS: 0.1 10*3/uL (ref 0.0–0.5)
EOS%: 1.3 % (ref 0.0–7.0)
HCT: 39 % (ref 34.8–46.6)
HEMOGLOBIN: 13 g/dL (ref 11.6–15.9)
LYMPH#: 1.3 10*3/uL (ref 0.9–3.3)
LYMPH%: 29.5 % (ref 14.0–49.7)
MCH: 30.6 pg (ref 25.1–34.0)
MCHC: 33.3 g/dL (ref 31.5–36.0)
MCV: 92 fL (ref 79.5–101.0)
MONO#: 0.4 10*3/uL (ref 0.1–0.9)
MONO%: 9.6 % (ref 0.0–14.0)
NEUT%: 58.7 % (ref 38.4–76.8)
NEUTROS ABS: 2.5 10*3/uL (ref 1.5–6.5)
Platelets: 221 10*3/uL (ref 145–400)
RBC: 4.24 10*6/uL (ref 3.70–5.45)
RDW: 12.8 % (ref 11.2–14.5)
WBC: 4.3 10*3/uL (ref 3.9–10.3)

## 2014-12-11 LAB — COMPREHENSIVE METABOLIC PANEL (CC13)
ALK PHOS: 44 U/L (ref 40–150)
ALT: 17 U/L (ref 0–55)
AST: 15 U/L (ref 5–34)
Albumin: 4.2 g/dL (ref 3.5–5.0)
Anion Gap: 11 mEq/L (ref 3–11)
BUN: 16.1 mg/dL (ref 7.0–26.0)
CO2: 26 mEq/L (ref 22–29)
CREATININE: 0.8 mg/dL (ref 0.6–1.1)
Calcium: 9.8 mg/dL (ref 8.4–10.4)
Chloride: 102 mEq/L (ref 98–109)
EGFR: 84 mL/min/{1.73_m2} — AB (ref >90)
GLUCOSE: 117 mg/dL (ref 70–140)
POTASSIUM: 4.6 meq/L (ref 3.5–5.1)
Sodium: 139 mEq/L (ref 136–145)
Total Bilirubin: 0.51 mg/dL (ref 0.20–1.20)
Total Protein: 6.5 g/dL (ref 6.4–8.3)

## 2014-12-11 NOTE — Telephone Encounter (Signed)
TC from patient to verify appt for labs and Dr. Jana Hakim. Reviewed these with her-Dr. Jana Hakim to review bone density test results with her on 12/15/14. No further questions. Pt verbalized understanding.

## 2014-12-18 ENCOUNTER — Ambulatory Visit (HOSPITAL_BASED_OUTPATIENT_CLINIC_OR_DEPARTMENT_OTHER): Payer: 59 | Admitting: Oncology

## 2014-12-18 ENCOUNTER — Telehealth: Payer: Self-pay | Admitting: Oncology

## 2014-12-18 VITALS — BP 123/68 | HR 62 | Temp 98.0°F | Resp 18 | Ht 63.0 in | Wt 120.4 lb

## 2014-12-18 DIAGNOSIS — Z853 Personal history of malignant neoplasm of breast: Secondary | ICD-10-CM

## 2014-12-18 DIAGNOSIS — M8580 Other specified disorders of bone density and structure, unspecified site: Secondary | ICD-10-CM | POA: Diagnosis not present

## 2014-12-18 DIAGNOSIS — C50912 Malignant neoplasm of unspecified site of left female breast: Secondary | ICD-10-CM

## 2014-12-18 NOTE — Telephone Encounter (Signed)
Gave avs. Beth Davis message fr survivorship one year.

## 2014-12-18 NOTE — Progress Notes (Signed)
ID: Beth Davis   DOB: Jul 04, 1951  MR#: [id number]  FTD#:[id number]  PCP: [name], FNP GYN: [name] SU:  OTHER MD:  CHIEF COMPLAINT: Early stage estrogen receptor positive breast cancer, osteopenia  CURRENT TREATMENT: observation   HISTORY OF PRESENT ILLNESS: From the original intake note:  She had diagnostic mammography October of 2010 for bilateral punctate and amorphous microcalcifications, followed by breast-specific gamma imaging that same month which showed no abnormal isotope activity.   Diagnostic mammography October of 2011 was again unremarkable except for the fact that she does have very dense breasts, and for that reason, she does not just get screening mammography but diagnostic.  This was repeated May 24, 2011, and this time new calcifications were identified posteriorly within the upper outer quadrant of the left breast.  They were mildly pleomorphic, and breast-specific gamma imaging was repeated and showed increased activity posteriorly in the left breast adjacent to the chest wall, correlating with the area of new microcalcifications noted on mammography.   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  The invasive tumor appeared to be intermediate grade and was positive for E-cadherin There was no evidence of angiolymphatic invasion.  A breast prognostic profile on the invasive tumor showed it to be estrogen receptor positive, progesterone receptor and HER2 negative, with an MIB-1 of 25%.   The patient was referred to Dr. [name], and bilateral breast MRIs were obtained May 30, 2011.  There was vague asymmetric enhancement in the lateral central aspect of the left breast measuring up to 8 mm.  There were no other suspicious masses, no axillary or  internal mammary adenopathy, and a 7 mm lesion in the liver is felt to be likely a cyst or hemangioma. On 06/14/2011 the patient underwent bilateral mastectomies with results as detailed below.  INTERVAL HISTORY: Belenda Cruise returns today for follow-up of her early stage breast cancer.  The interval history is stable. She walks her dog about 40 minutes daily. She does not otherwise exercise regularly.  REVIEW OF SYSTEMS:  she has a history of migraines which are well-controlled on her current medications and followed by her primary care physician. There has been no significant change. She has a history of chest wall tightness , for which she does some stretching exercises at times. A detailed review of systems today was otherwise entirely negative.  PAST MEDICAL HISTORY: Past Medical History  Diagnosis Date  . Hypothyroidism   . Collagenous colitis   . Carpal tunnel syndrome of right wrist   . Osteoporosis   . Breast cancer, IDC, Left, ER +, PR -, Her2 -. 06/01/2011  . Breast cancer 05/25/2011    left,inv ductal carcinoma, DCIS, ER+, PR, Her2  -  . Wears glasses   migraines  PAST SURGICAL HISTORY: Past Surgical History  Procedure Laterality Date  . Laparoscopy abdomen diagnostic    . Orif femur fracture    . Orif humerus decompression    . Colonoscopy  2012  . Mastectomy  06/14/2011    Right - prophylactic  . Simple mastectomy w/ sentinel node biopsy  06/14/2011    Left  . Breast surgery  2004    excision left breast fibroadenoma  . Portacath placement  07/26/2011    Procedure: INSERTION PORT-A-CATH;  Surgeon: Haywood Lasso, MD;  Location: [facility];  Service: General;  Laterality: N/A;  port a cath placement  . Abdominal hysterectomy  2006  . Port-a-cath removal  10/24/2011    Procedure: MINOR REMOVAL PORT-A-CATH;  Surgeon: Haywood Lasso, MD;  Location: [facility];  Service: General;  Laterality: Right;  removal of portacath  . Mass  excision  03/12/2012    Procedure: MINOR EXCISION OF MASS;  Surgeon: Haywood Lasso, MD;  Location: [facility];  Service: General;  Laterality: Right;  removal part of port-a-cath scar  . Carpal tunnel release Right 02/05/2013    Procedure: CARPAL TUNNEL RELEASE;  Surgeon: Cammie Sickle., MD;  Location: [facility];  Service: Orthopedics;  Laterality: Right;    FAMILY HISTORY Family History  Problem Relation Age of Onset  . Cancer Mother 72    breast  . Cancer Cousin 11    breast  . Heart attack Father   The patient's father died at the age of 101.  Her mother is alive in her 38s.  She has 3 brothers, no sisters.  The patient's mother did have breast cancer at the age of 45.  A paternal cousin had breast cancer at the age of 36.  The patient has been tested and is BRCA 1-2 negative  GYNECOLOGIC HISTORY: Menarche age 84.   Simple hysterectomy 2006.  She is GX, P2, 1st pregnancy to term age 65, which of course is a risk factor for breast cancer.  She used birth control pills between 54 and 1974 and took Clomid for fertility between [place].  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  Her husband, Roderic Palau (goes by Wille Glaser), Sawin is a Chief Executive Officer here in town.  Daughter, Joellen Jersey,  lives in [place], [place] where she works as a Pharmacist, community.  Daughter, Gregary Signs,  is a Sport and exercise psychologist here in [place] in Medical sales representative.   ADVANCED DIRECTIVES: in place  HEALTH MAINTENANCE: History  Substance Use Topics  . Smoking status: Former Smoker -- 0.50 packs/day for 6 years    Types: Cigarettes    Quit date: 07/21/1975  . Smokeless tobacco: Never Used     Comment: smoked in college  . Alcohol Use: No     Colonoscopy: October 2012  PAP: s/p hysterectomy  Bone density:  April 2016    Allergies  Allergen Reactions  . Erythromycin Nausea Only  .  Penicillins Hives  . Reglan [Metoclopramide Hcl] Diarrhea    Current Outpatient Prescriptions  Medication Sig Dispense Refill  . calcium citrate-vitamin D (CITRACAL+D) 315-200 MG-UNIT per tablet Take 1 tablet by mouth 4 (four) times daily.    . cholecalciferol (VITAMIN D) 1000 UNITS tablet Take 1,000 Units by mouth daily.      . frovatriptan (FROVA) 2.5 MG tablet Take 2.5 mg by mouth as needed. If recurs, may repeat after 2 hours. Max of 3 tabs in 24 hours.     . gabapentin (NEURONTIN) 300 MG capsule Take 1 capsule (300 mg total) by mouth at bedtime. 90 capsule 4  . levothyroxine (SYNTHROID, LEVOTHROID) 50 MCG tablet Take 50 mcg by mouth daily.      . [DISCONTINUED] promethazine (PHENERGAN) 25 MG tablet      No current facility-administered medications for this visit.    OBJECTIVE: Middle-aged white woman who appears well Filed Vitals:   12/18/14 1151  BP: 123/68  Pulse: 62  Temp: 98 F (36.7 C)  Resp: 18     Body mass index is 21.33 kg/(m^2).    ECOG FS: 0 Filed Weights   12/18/14 1151  Weight: 120 lb 6.4 oz (54.613 kg)    Sclerae unicteric,  EOMs intact Oropharynx clear and moist No cervical or supraclavicular adenopathy Lungs no rales or rhonchi Heart regular rate and rhythm Abd soft, nontender, positive bowel sounds MSK no focal spinal tenderness, no upper extremity lymphedema Neuro: nonfocal, well oriented, positiveaffect Breasts: Status post bilateral mastectomies. There is no evidence of chest wall recurrence. Both axillae are benign.    LAB RESULTS: Lab Results  Component Value Date   WBC 4.3 12/11/2014   NEUTROABS 2.5 12/11/2014   HGB 13.0 12/11/2014   HCT 39.0 12/11/2014   MCV 92.0 12/11/2014   PLT 221 12/11/2014      Chemistry      Component Value Date/Time   NA 139 12/11/2014 1338   NA 143 02/27/2012 0957   K 4.6 12/11/2014 1338   K 3.8 02/27/2012 0957   CL 106 09/18/2012 1444   CL 107 02/27/2012 0957   CO2 26 12/11/2014 1338   CO2 29  02/27/2012 0957   BUN 16.1 12/11/2014 1338   BUN 11 02/27/2012 0957   CREATININE 0.8 12/11/2014 1338   CREATININE 0.69 02/27/2012 0957      Component Value Date/Time   CALCIUM 9.8 12/11/2014 1338   CALCIUM 10.0 02/27/2012 0957   ALKPHOS 44 12/11/2014 1338   ALKPHOS 64 02/27/2012 0957   AST 15 12/11/2014 1338   AST 16 02/27/2012 0957   ALT 17 12/11/2014 1338   ALT 13 02/27/2012 0957   BILITOT 0.51 12/11/2014 1338   BILITOT 0.6 02/27/2012 0957       Lab Results  Component Value Date   LABCA2 9 09/18/2012     STUDIES:  bone density [facility] at [facility] 2016 shows 5-6% improvement in her bone density, with a T score of -2.0   ASSESSMENT: 64 y.o.  [place] woman   (1)  status post bilateral mastectomies October of 2012 for a left sided invasive ductal carcinoma, T1b N0 or Stage IA, grade 2, estrogen receptor 95% positive, progesterone receptor negative, with no HER-2 amplification and an MIB-1 of 26%;  (2) Oncotype DX score in the intermediate range, predicting a 19% risk of recurrence with 5 years of tamoxifen;   (3)  treated in the adjuvant setting with docetaxel/cyclophosphamide x4 completed 10/03/2011;   (4)  She started letrozole early April 2013, switched to tamoxifen December 2013, stopped tamoxifen in April 2014; resuming tamoxifen at 10 mg daily August of 2014, stopped September 2014 and decided against further anti-estrogen therapy  (5) osteopenia, s/p Zometa 03/13/2012,  second dose 10/01/2014  (a) bone density February 2016 at [facility] showed a T score of - 2.0, but overall was improved  PLAN:  Avy is doing fine from a breast cancer point of view. She feels normal and ready to " fly way".    we discussed the fact that she does not have "chest tightness" , but "chest wall tightness". In other words this is not a cardiac problem. We discussed the difference between stretching muscles, which can cause rebound contraction, and stretching and holding muscles  in his stretched position , which generally causes relaxation. She will need to continue to do her stretch on hold exercises indefinitely. Otherwise the feeling of chest wall tightness we'll persistent possibly worsen. She understands this is not related to  breast cancer as such, but do the surgery she received.   I think she would be a good candidate for our survivorship clinic and she will see our survivorship advanced practice provider a year from now.  At that point she will be 5 years out from her definitive surgery and can consider "graduating". Alternatively she could see me the following year, with a repeat bone density prior to the 2018 visit. I will be glad to "drop in" on the 2017 visit if only to wish her well   she has a good understanding of this plan and agrees with it. She will call with any problems that may develop before next visit here.    Marland KitchenChauncey Cruel, MD    12/18/2014

## 2015-02-11 ENCOUNTER — Encounter: Payer: Self-pay | Admitting: Genetic Counselor

## 2015-03-06 ENCOUNTER — Encounter: Payer: Self-pay | Admitting: Genetic Counselor

## 2015-03-06 DIAGNOSIS — Z1379 Encounter for other screening for genetic and chromosomal anomalies: Secondary | ICD-10-CM | POA: Insufficient documentation

## 2015-03-09 ENCOUNTER — Other Ambulatory Visit: Payer: 59

## 2015-03-09 ENCOUNTER — Encounter: Payer: Self-pay | Admitting: Genetic Counselor

## 2015-03-09 ENCOUNTER — Ambulatory Visit (HOSPITAL_BASED_OUTPATIENT_CLINIC_OR_DEPARTMENT_OTHER): Payer: 59 | Admitting: Genetic Counselor

## 2015-03-09 DIAGNOSIS — Z803 Family history of malignant neoplasm of breast: Secondary | ICD-10-CM | POA: Diagnosis not present

## 2015-03-09 DIAGNOSIS — Z808 Family history of malignant neoplasm of other organs or systems: Secondary | ICD-10-CM | POA: Insufficient documentation

## 2015-03-09 DIAGNOSIS — C50912 Malignant neoplasm of unspecified site of left female breast: Secondary | ICD-10-CM

## 2015-03-09 DIAGNOSIS — Z315 Encounter for genetic counseling: Secondary | ICD-10-CM | POA: Diagnosis not present

## 2015-03-09 DIAGNOSIS — C50812 Malignant neoplasm of overlapping sites of left female breast: Secondary | ICD-10-CM | POA: Diagnosis not present

## 2015-03-09 DIAGNOSIS — M8580 Other specified disorders of bone density and structure, unspecified site: Secondary | ICD-10-CM
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
## 2015-03-24 ENCOUNTER — Ambulatory Visit: Payer: Self-pay | Admitting: Genetic Counselor

## 2015-03-24 ENCOUNTER — Telehealth: Payer: Self-pay | Admitting: Genetic Counselor

## 2015-03-24 DIAGNOSIS — Z803 Family history of malignant neoplasm of breast: Secondary | ICD-10-CM

## 2015-03-24 DIAGNOSIS — C50912 Malignant neoplasm of unspecified site of left female breast: Secondary | ICD-10-CM

## 2015-03-24 DIAGNOSIS — Z808 Family history of malignant neoplasm of other organs or systems: Secondary | ICD-10-CM

## 2015-03-24 DIAGNOSIS — Z1379 Encounter for other screening for genetic and chromosomal anomalies: Secondary | ICD-10-CM

## 2015-03-24 NOTE — Progress Notes (Signed)
HPI: Ms. Beth Davis was previously seen in the [place] clinic due to a personal and family history of breast cancer and concerns regarding a hereditary predisposition to cancer. Please refer to our prior cancer genetics clinic note for more information regarding Ms. Beth Davis's medical, social and family histories, and our assessment and recommendations, at the time. Ms. Beth Davis recent genetic test results were disclosed to her, as were recommendations warranted by these results. These results and recommendations are discussed in more detail below.  GENETIC TEST RESULTS: At the time of Ms. Beth Davis's visit, we recommended she pursue genetic testing of a custom gene panel looking at genes associated with breast and ovarian cancer and melanoma. The Custom gene panel offered by [facility] includes sequencing and rearrangement analysis for the following 25 genes:  ATM, BAP1, BARD1, BRCA1, BRCA2, BRIP1, CDH1, CDK4, CDKN2A, CHEK2, EPCAM, FANCC, MITF, MLH1, MSH2, MSH6, NBN, PALB2, PMS2, PTEN, RAD51C, RAD51D, RB1, TP53, and XRCC2.   The report date is March 20, 2015.  Genetic testing was normal, and did not reveal a deleterious mutation in these genes. The test report has been scanned into EPIC and is located under the Media tab.   We discussed with Ms. Beth Davis that since the current genetic testing is not perfect, it is possible there may be a gene mutation in one of these genes that current testing cannot detect, but that chance is small. We also discussed, that it is possible that another gene that has not yet been discovered, or that we have not yet tested, is responsible for the cancer diagnoses in the family, and it is, therefore, important to remain in touch with cancer genetics in the future so that we can continue to offer Ms. Beth Davis the most up to date genetic testing.   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 This is not an actionable result. At this time, it is unknown if this variant is associated with increased cancer risk or if this is a normal finding, but most variants such as this get reclassified to being inconsequential. It should not be used to make medical management decisions. With time, we suspect the lab will determine the significance of this variant, if any. If we do learn more about it, we will try to contact Ms. Beth Davis to discuss it further. However, it is important to stay in touch with [place] periodically and keep the address and phone number up to date.   CANCER SCREENING RECOMMENDATIONS: This result is reassuring and indicates that Ms. Beth Davis likely does not have an increased risk for a future cancer due to a mutation in one of these genes. This normal test also suggests that Ms. Beth Davis's cancer was most likely not due to an inherited predisposition associated with one of these genes.  Most cancers happen by chance and this negative test suggests that her cancer falls into this category.  We, therefore, recommended she continue to follow the cancer management and screening guidelines provided by her oncology and primary healthcare provider.   RECOMMENDATIONS FOR FAMILY MEMBERS: Women in this family might be at some increased risk of developing cancer, over the general population risk, simply due to the family history of cancer. We recommended women in this family have a yearly mammogram beginning at age 9, or 34 years younger than the earliest onset of cancer, an an annual clinical breast exam, and perform monthly breast self-exams. Women in this family should also have a gynecological  exam as recommended by their primary provider. All family members should have a colonoscopy by age 34.  FOLLOW-UP: Lastly, we discussed with Ms. Beth Davis that cancer genetics is a rapidly advancing field and it is possible that new genetic tests will be appropriate for her and/or  her family members in the future. We encouraged her to remain in contact with cancer genetics on an annual basis so we can update her personal and family histories and let her know of advances in cancer genetics that may benefit this family.   Our contact number was provided. Ms. Beth Davis questions were answered to her satisfaction, and she knows she is welcome to call us at anytime with additional questions or concerns.   Roma Kayser, MS, [facility] Certified Genetic Counselor [email or web address]

## 2015-03-24 NOTE — Telephone Encounter (Signed)
Discussed that PALB2 VUS found on a custom panel.  Explained that most of the time VUS are reclassified as inconsequential, therefore we will not change her medical management or offer testing to other family members.  Discussed that her test is essentially negative.

## 2015-04-17 ENCOUNTER — Encounter (HOSPITAL_COMMUNITY): Payer: Self-pay

## 2015-08-18 ENCOUNTER — Encounter (HOSPITAL_COMMUNITY): Payer: BLUE CROSS/BLUE SHIELD

## 2015-08-31 ENCOUNTER — Telehealth: Payer: Self-pay | Admitting: Oncology

## 2015-08-31 NOTE — Telephone Encounter (Signed)
Patient called in to make her survivorship appt

## 2015-09-09 ENCOUNTER — Telehealth: Payer: Self-pay | Admitting: Oncology

## 2015-09-09 NOTE — Telephone Encounter (Signed)
Called and left a message with labs per last pof

## 2015-09-18 ENCOUNTER — Encounter (HOSPITAL_BASED_OUTPATIENT_CLINIC_OR_DEPARTMENT_OTHER): Payer: Self-pay | Admitting: *Deleted

## 2015-09-18 ENCOUNTER — Encounter (HOSPITAL_COMMUNITY): Admission: RE | Admit: 2015-09-18 | Payer: BLUE CROSS/BLUE SHIELD | Source: Ambulatory Visit

## 2015-09-18 LAB — CBC
HCT: 40.5 % (ref 36.0–46.0)
HEMOGLOBIN: 13.5 g/dL (ref 12.0–15.0)
MCH: 30.6 pg (ref 26.0–34.0)
MCHC: 33.3 g/dL (ref 30.0–36.0)
MCV: 91.8 fL (ref 78.0–100.0)
PLATELETS: 226 10*3/uL (ref 150–400)
RBC: 4.41 MIL/uL (ref 3.87–5.11)
RDW: 12.4 % (ref 11.5–15.5)
WBC: 6.2 10*3/uL (ref 4.0–10.5)

## 2015-09-18 LAB — BASIC METABOLIC PANEL
Anion gap: 11 (ref 5–15)
BUN: 14 mg/dL (ref 6–20)
CHLORIDE: 104 mmol/L (ref 101–111)
CO2: 28 mmol/L (ref 22–32)
CREATININE: 0.87 mg/dL (ref 0.44–1.00)
Calcium: 10.3 mg/dL (ref 8.9–10.3)
GFR calc Af Amer: >60 mL/min (ref >60)
GFR calc non Af Amer: >60 mL/min (ref >60)
Glucose, Bld: 114 mg/dL — ABNORMAL HIGH (ref 65–99)
POTASSIUM: 4.6 mmol/L (ref 3.5–5.1)
Sodium: 143 mmol/L (ref 135–145)

## 2015-09-18 NOTE — Progress Notes (Signed)
NPO AFTER MN.  ARRIVE AT 0600.  NEEDS T & S.  GETTING LAB WORK DONE TODAY (cbc , bmet).  WILL TAKE SYNTHROID AM DOS W/ SIPS OF WATER.  REVIEWED [facility] GUIDELINES , WILL BRING MEDS.

## 2015-09-18 NOTE — H&P (Signed)
  Patient name Beth Davis, Beth Davis DICTATION# [id number] CSN# [id number]  [facility], MD 09/18/2015 10:42 AM

## 2015-09-19 NOTE — H&P (Signed)
NAME:  Beth Davis, Beth Davis NO.:  [id number]  MEDICAL RECORD NO.:  [id number]  LOCATION:                                 FACILITY:  PHYSICIAN:  Darlyn Chamber, M.D.        DATE OF BIRTH:  DATE OF ADMISSION: DATE OF DISCHARGE:                             HISTORY & PHYSICAL   HISTORY OF PRESENT ILLNESS:  The patient is a 65 year old, gravida 2, para 2 female, who presents for laparoscopic removal of cervical stump and both tubes and ovaries.  Patient in 2006, had a previous supracervical hysterectomy and removal of both fallopian tubes.  Prior to this, she had a history of use of __________ and developed pelvic inflammatory disease.  Evidently, had a tuboplasty done through a low transverse incision to get pregnant.  She has been having intermittent left lower quadrant pain and discomfort and a pulling sensation, particularly when she puts her left leg out straight.  She has a history of breast cancer with bilateral mastectomies.  Patient had previous BRCA testing that was negative. Subsequent ultrasound in the office revealed the cervical stump.  Both ovaries appeared to be normal.  It was noted that the cervical stump does tend to pull to the left and maybe involved in some adhesive process.  We discussed various options, including watching this conservatively versus laparoscopic lysis of adhesions versus more aggressive surgery.  She was in favor of proceeding with attempt at laparoscopic removal of the cervical stump and both tubes and ovaries. We discussed that there were numerous adhesions, this had to be done through a low-transverse incision.  She does understand this.  ALLERGIES:  In terms of allergies, she is allergic to PENICILLIN.  MEDICATIONS:  She is on Synthroid and levothyroxine.  PAST MEDICAL HISTORY:  She has a history of hypothyroidism for which she is on medications, does have history of migraine headaches.  History of abnormal cervical  cytology.  She also has a history of breast cancer.  SURGICAL HISTORY:  She has had a bilateral mastectomies in 2012 had the above-noted hysterectomy in 2006 and 2 vaginal deliveries.  SOCIAL HISTORY:  No tobacco or alcohol use.  FAMILY HISTORY:  Noncontributory.  REVIEW OF SYSTEMS:  Noncontributory.  PHYSICAL EXAMINATION:  VITAL SIGNS:  Patient is afebrile, stable vital signs. HEENT:  The patient is normocephalic.  Pupils are round and reactive to light and accommodation.  Extraocular movements were intact.  Sclerae and conjunctivae clear.  Oropharynx clear. NECK:  Without thyromegaly. BREASTS:  Not examined. LUNGS:  Clear. CARDIOVASCULAR:  Regular rate.  No murmurs or gallops. ABDOMEN:  Benign.  Well-healed low-transverse incision. PELVIC:  Normal external genitalia.  Vaginal mucosa is clear.  Cervix is noted.  On exam, there is a left-sided fullness where the cervical stump tends to lean.  There are otherwise no adnexal masses. EXTREMITIES:  Trace edema. NEUROLOGIC:  Grossly within normal limits.  IMPRESSION: 1. Pelvic pain discomfort possibly from pelvic adhesions. 2. Previous supracervical hysterectomy, removed both fallopian tubes. 3. History of breast cancer with bilateral mastectomies.  PLAN:  Patient going to attempt laparoscopic removal of the cervical stump and both tubes and ovaries.  The possible need for exploratory surgery is discussed.  The risks of surgery have been explained including the risk of infection.  Risk of hemorrhage that could require transfusion with the risk of AIDS, hepatitis.  There was a risk of injury to adjacent organs such as bowel, bladder, ureters that could require further exploratory surgery.  Risk of deep venous thrombosis and pulmonary embolus.  Prior to this a laparoscopic cystoscopy will be performed to rule out bladder or ureter injuries.  The patient does understand the indications, risks, and alternatives.     Darlyn Chamber, M.D.     JSM/MEDQ  D:  09/18/2015  T:  09/18/2015  Job:  [id number]

## 2015-09-27 NOTE — Anesthesia Preprocedure Evaluation (Addendum)
Anesthesia Evaluation  Patient identified by MRN, date of birth, ID band Patient awake    Reviewed: Allergy & Precautions, NPO status , Patient's Chart, lab work & pertinent test results  History of Anesthesia Complications (+) PONV and history of anesthetic complications  Airway Mallampati: I  TM Distance: >3 FB Neck ROM: Full    Dental  (+) Teeth Intact, Dental Advisory Given   Pulmonary former smoker,    breath sounds clear to auscultation       Cardiovascular negative cardio ROS   Rhythm:Regular Rate:Normal     Neuro/Psych  Headaches, negative psych ROS   GI/Hepatic negative GI ROS, Neg liver ROS,   Endo/Other  Hypothyroidism   Renal/GU negative Renal ROS  negative genitourinary   Musculoskeletal  (+) Arthritis ,   Abdominal   Peds negative pediatric ROS (+)  Hematology   Anesthesia Other Findings - Breast CA  Reproductive/Obstetrics negative OB ROS                            Lab Results  Component Value Date   WBC 6.2 09/18/2015   HGB 13.5 09/18/2015   HCT 40.5 09/18/2015   MCV 91.8 09/18/2015   PLT 226 09/18/2015   Lab Results  Component Value Date   CREATININE 0.87 09/18/2015   BUN 14 09/18/2015   NA 143 09/18/2015   K 4.6 09/18/2015   CL 104 09/18/2015   CO2 28 09/18/2015   No results found for: INR, PROTIME   Anesthesia Physical Anesthesia Plan  ASA: II  Anesthesia Plan: General   Post-op Pain Management:    Induction: Intravenous  Airway Management Planned: Oral ETT  Additional Equipment:   Intra-op Plan:   Post-operative Plan: Extubation in OR  Informed Consent: I have reviewed the patients History and Physical, chart, labs and discussed the procedure including the risks, benefits and alternatives for the proposed anesthesia with the patient or authorized representative who has indicated his/her understanding and acceptance.   Dental advisory  given  Plan Discussed with: CRNA  Anesthesia Plan Comments:         Anesthesia Quick Evaluation

## 2015-09-28 ENCOUNTER — Encounter (HOSPITAL_COMMUNITY)
Admission: RE | Disposition: A | Discharge status: Home / Self Care | Payer: Self-pay | Source: Ambulatory Visit | Attending: Obstetrics and Gynecology

## 2015-09-28 ENCOUNTER — Encounter (HOSPITAL_BASED_OUTPATIENT_CLINIC_OR_DEPARTMENT_OTHER): Payer: Self-pay

## 2015-09-28 ENCOUNTER — Ambulatory Visit (HOSPITAL_BASED_OUTPATIENT_CLINIC_OR_DEPARTMENT_OTHER): Payer: BLUE CROSS/BLUE SHIELD | Admitting: Anesthesiology

## 2015-09-28 ENCOUNTER — Observation Stay (HOSPITAL_BASED_OUTPATIENT_CLINIC_OR_DEPARTMENT_OTHER)
Admission: RE | Admit: 2015-09-28 | Discharge: 2015-09-30 | DRG: 742 | Disposition: A | Discharge status: Home / Self Care | Payer: BLUE CROSS/BLUE SHIELD | Source: Ambulatory Visit | Attending: Obstetrics and Gynecology | Admitting: Obstetrics and Gynecology

## 2015-09-28 DIAGNOSIS — K9189 Other postprocedural complications and disorders of digestive system: Secondary | ICD-10-CM | POA: Diagnosis not present

## 2015-09-28 DIAGNOSIS — Z90710 Acquired absence of both cervix and uterus: Secondary | ICD-10-CM

## 2015-09-28 DIAGNOSIS — K567 Ileus, unspecified: Secondary | ICD-10-CM | POA: Diagnosis not present

## 2015-09-28 DIAGNOSIS — Z853 Personal history of malignant neoplasm of breast: Secondary | ICD-10-CM

## 2015-09-28 DIAGNOSIS — Z5331 Laparoscopic surgical procedure converted to open procedure: Secondary | ICD-10-CM

## 2015-09-28 DIAGNOSIS — N736 Female pelvic peritoneal adhesions (postinfective): Principal | ICD-10-CM | POA: Diagnosis present

## 2015-09-28 DIAGNOSIS — E039 Hypothyroidism, unspecified: Secondary | ICD-10-CM | POA: Diagnosis present

## 2015-09-28 DIAGNOSIS — Z90722 Acquired absence of ovaries, bilateral: Secondary | ICD-10-CM

## 2015-09-28 DIAGNOSIS — Z9013 Acquired absence of bilateral breasts and nipples: Secondary | ICD-10-CM

## 2015-09-28 HISTORY — DX: Migraine, unspecified, not intractable, without status migrainosus: G43.909

## 2015-09-28 HISTORY — DX: Pelvic and perineal pain: R10.2

## 2015-09-28 HISTORY — PX: LAPAROSCOPIC BILATERAL SALPINGO OOPHERECTOMY: SHX5890

## 2015-09-28 HISTORY — DX: Female pelvic peritoneal adhesions (postinfective): N73.6

## 2015-09-28 HISTORY — DX: Unspecified osteoarthritis, unspecified site: M19.90

## 2015-09-28 HISTORY — DX: Other specified postprocedural states: R11.2

## 2015-09-28 HISTORY — PX: CYSTOSCOPY: SHX5120

## 2015-09-28 HISTORY — DX: Personal history of other diseases of the digestive system: Z87.19

## 2015-09-28 HISTORY — DX: Other specified postprocedural states: Z98.890

## 2015-09-28 LAB — CBC WITH DIFFERENTIAL/PLATELET
BASOS ABS: 0 10*3/uL (ref 0.0–0.1)
BASOS PCT: 1 %
EOS ABS: 0.1 10*3/uL (ref 0.0–0.7)
EOS PCT: 3 %
HCT: 39.2 % (ref 36.0–46.0)
Hemoglobin: 13 g/dL (ref 12.0–15.0)
Lymphocytes Relative: 35 %
Lymphs Abs: 1.3 10*3/uL (ref 0.7–4.0)
MCH: 31.2 pg (ref 26.0–34.0)
MCHC: 33.2 g/dL (ref 30.0–36.0)
MCV: 94 fL (ref 78.0–100.0)
MONO ABS: 0.4 10*3/uL (ref 0.1–1.0)
MONOS PCT: 10 %
Neutro Abs: 1.9 10*3/uL (ref 1.7–7.7)
Neutrophils Relative %: 51 %
PLATELETS: 212 10*3/uL (ref 150–400)
RBC: 4.17 MIL/uL (ref 3.87–5.11)
RDW: 12.7 % (ref 11.5–15.5)
WBC: 3.7 10*3/uL — ABNORMAL LOW (ref 4.0–10.5)

## 2015-09-28 LAB — POCT I-STAT, CHEM 8
BUN: 12 mg/dL (ref 6–20)
Calcium, Ion: 1.27 mmol/L (ref 1.13–1.30)
Chloride: 102 mmol/L (ref 101–111)
Creatinine, Ser: 0.6 mg/dL (ref 0.44–1.00)
GLUCOSE: 97 mg/dL (ref 65–99)
HEMATOCRIT: 41 % (ref 36.0–46.0)
HEMOGLOBIN: 13.9 g/dL (ref 12.0–15.0)
POTASSIUM: 4.1 mmol/L (ref 3.5–5.1)
Sodium: 143 mmol/L (ref 135–145)
TCO2: 31 mmol/L (ref 0–100)

## 2015-09-28 LAB — TYPE AND SCREEN
ABO/RH(D): B POS
Antibody Screen: NEGATIVE

## 2015-09-28 LAB — BASIC METABOLIC PANEL
ANION GAP: 7 (ref 5–15)
BUN: 12 mg/dL (ref 6–20)
CALCIUM: 9.6 mg/dL (ref 8.9–10.3)
CO2: 30 mmol/L (ref 22–32)
CREATININE: 0.66 mg/dL (ref 0.44–1.00)
Chloride: 106 mmol/L (ref 101–111)
Glucose, Bld: 104 mg/dL — ABNORMAL HIGH (ref 65–99)
Potassium: 4.1 mmol/L (ref 3.5–5.1)
Sodium: 143 mmol/L (ref 135–145)

## 2015-09-28 LAB — ABO/RH: ABO/RH(D): B POS

## 2015-09-28 SURGERY — SALPINGO-OOPHORECTOMY, BILATERAL, LAPAROSCOPIC
Anesthesia: General | Site: Bladder | Laterality: Bilateral

## 2015-09-28 MED ORDER — FLUORESCEIN SODIUM 10 % IJ SOLN
INTRAMUSCULAR | Status: DC | PRN
  Administered 2015-09-28: 2.5 mL via INTRAVENOUS

## 2015-09-28 MED ORDER — LIDOCAINE HCL (CARDIAC) 20 MG/ML IV SOLN
INTRAVENOUS | Status: AC
  Filled 2015-09-28: qty 5

## 2015-09-28 MED ORDER — MIDAZOLAM HCL 2 MG/2ML IJ SOLN
INTRAMUSCULAR | Status: AC
  Filled 2015-09-28: qty 2

## 2015-09-28 MED ORDER — CETYLPYRIDINIUM CHLORIDE 0.05 % MT LIQD
7.0000 mL | Freq: Two times a day (BID) | OROMUCOSAL | Status: DC
  Administered 2015-09-28 – 2015-09-29 (×3): 7 mL via OROMUCOSAL

## 2015-09-28 MED ORDER — ONDANSETRON HCL 4 MG/2ML IJ SOLN
INTRAMUSCULAR | Status: AC
  Filled 2015-09-28: qty 2

## 2015-09-28 MED ORDER — ROCURONIUM BROMIDE 100 MG/10ML IV SOLN
INTRAVENOUS | Status: DC | PRN
  Administered 2015-09-28: 10 mg via INTRAVENOUS
  Administered 2015-09-28: 30 mg via INTRAVENOUS

## 2015-09-28 MED ORDER — MENTHOL 3 MG MT LOZG
1.0000 | LOZENGE | OROMUCOSAL | Status: DC | PRN
  Filled 2015-09-28: qty 9

## 2015-09-28 MED ORDER — LACTATED RINGERS IV SOLN
INTRAVENOUS | Status: DC
  Administered 2015-09-28 (×3): via INTRAVENOUS
  Filled 2015-09-28: qty 1000

## 2015-09-28 MED ORDER — FENTANYL CITRATE (PF) 100 MCG/2ML IJ SOLN
INTRAMUSCULAR | Status: DC | PRN
  Administered 2015-09-28 (×5): 50 ug via INTRAVENOUS

## 2015-09-28 MED ORDER — LACTATED RINGERS IV SOLN
INTRAVENOUS | Status: DC
  Administered 2015-09-29 – 2015-09-30 (×2): 1000 mL via INTRAVENOUS

## 2015-09-28 MED ORDER — ACETAMINOPHEN 325 MG PO TABS: 650.0000 mg | ORAL_TABLET | ORAL | Status: DC | PRN

## 2015-09-28 MED ORDER — GENTAMICIN SULFATE 40 MG/ML IJ SOLN
INTRAVENOUS | Status: AC
  Administered 2015-09-28: 100 mL via INTRAVENOUS
  Filled 2015-09-28 (×2): qty 6.5

## 2015-09-28 MED ORDER — HYDROMORPHONE HCL 1 MG/ML IJ SOLN
INTRAMUSCULAR | Status: AC
  Filled 2015-09-28: qty 1

## 2015-09-28 MED ORDER — ONDANSETRON HCL 4 MG PO TABS
4.0000 mg | ORAL_TABLET | Freq: Four times a day (QID) | ORAL | Status: DC | PRN
  Administered 2015-09-29: 4 mg via ORAL
  Filled 2015-09-28: qty 1

## 2015-09-28 MED ORDER — ONDANSETRON HCL 4 MG/2ML IJ SOLN
INTRAMUSCULAR | Status: DC | PRN
  Administered 2015-09-28: 4 mg via INTRAVENOUS

## 2015-09-28 MED ORDER — EPHEDRINE SULFATE 50 MG/ML IJ SOLN
INTRAMUSCULAR | Status: AC
  Filled 2015-09-28: qty 1

## 2015-09-28 MED ORDER — CALCIUM CITRATE-VITAMIN D 315-200 MG-UNIT PO TABS
1.0000 | ORAL_TABLET | Freq: Four times a day (QID) | ORAL | Status: DC
Start: 2015-09-28 — End: 2015-09-28

## 2015-09-28 MED ORDER — NALOXONE HCL 0.4 MG/ML IJ SOLN: 0.4000 mg | INTRAMUSCULAR | Status: DC | PRN

## 2015-09-28 MED ORDER — ASPIRIN EC 325 MG PO TBEC
325.0000 mg | DELAYED_RELEASE_TABLET | ORAL | Status: DC | PRN
  Filled 2015-09-28: qty 1

## 2015-09-28 MED ORDER — GLYCOPYRROLATE 0.2 MG/ML IJ SOLN
INTRAMUSCULAR | Status: AC
  Filled 2015-09-28: qty 3

## 2015-09-28 MED ORDER — MEPERIDINE HCL 25 MG/ML IJ SOLN
6.2500 mg | INTRAMUSCULAR | Status: DC | PRN
  Filled 2015-09-28: qty 1

## 2015-09-28 MED ORDER — VITAMIN D3 25 MCG (1000 UNIT) PO TABS
1000.0000 [IU] | ORAL_TABLET | Freq: Every day | ORAL | Status: DC
  Filled 2015-09-28 (×2): qty 1

## 2015-09-28 MED ORDER — ROCURONIUM BROMIDE 100 MG/10ML IV SOLN
INTRAVENOUS | Status: AC
Start: 2015-09-28 — End: 2015-09-28
  Filled 2015-09-28: qty 1

## 2015-09-28 MED ORDER — DEXAMETHASONE SODIUM PHOSPHATE 4 MG/ML IJ SOLN
INTRAMUSCULAR | Status: DC | PRN
  Administered 2015-09-28: 10 mg via INTRAVENOUS

## 2015-09-28 MED ORDER — HYDROMORPHONE HCL 1 MG/ML IJ SOLN
0.2500 mg | INTRAMUSCULAR | Status: DC | PRN
  Administered 2015-09-28 (×4): 0.25 mg via INTRAVENOUS
  Administered 2015-09-28: 0.5 mg via INTRAVENOUS
  Administered 2015-09-28 (×2): 0.25 mg via INTRAVENOUS
  Filled 2015-09-28: qty 1

## 2015-09-28 MED ORDER — ONDANSETRON HCL 4 MG/2ML IJ SOLN
4.0000 mg | Freq: Four times a day (QID) | INTRAMUSCULAR | Status: DC | PRN
  Administered 2015-09-29 (×2): 4 mg via INTRAVENOUS
  Filled 2015-09-28 (×3): qty 2

## 2015-09-28 MED ORDER — SUCCINYLCHOLINE CHLORIDE 20 MG/ML IJ SOLN
INTRAMUSCULAR | Status: DC | PRN
  Administered 2015-09-28: 100 mg via INTRAVENOUS

## 2015-09-28 MED ORDER — LACTATED RINGERS IV SOLN
INTRAVENOUS | Status: DC
  Administered 2015-09-28: 13:00:00 via INTRAVENOUS
  Filled 2015-09-28: qty 1000

## 2015-09-28 MED ORDER — ARTIFICIAL TEARS OP OINT
TOPICAL_OINTMENT | OPHTHALMIC | Status: AC
Start: 2015-09-28 — End: 2015-09-28
  Filled 2015-09-28: qty 3.5

## 2015-09-28 MED ORDER — DIPHENHYDRAMINE HCL 50 MG/ML IJ SOLN: 12.5000 mg | Freq: Four times a day (QID) | INTRAMUSCULAR | Status: DC | PRN

## 2015-09-28 MED ORDER — FENTANYL CITRATE (PF) 250 MCG/5ML IJ SOLN
INTRAMUSCULAR | Status: AC
  Filled 2015-09-28: qty 5

## 2015-09-28 MED ORDER — DEXAMETHASONE SODIUM PHOSPHATE 10 MG/ML IJ SOLN
INTRAMUSCULAR | Status: AC
  Filled 2015-09-28: qty 1

## 2015-09-28 MED ORDER — EPHEDRINE SULFATE 50 MG/ML IJ SOLN
INTRAMUSCULAR | Status: DC | PRN
  Administered 2015-09-28 (×2): 10 mg via INTRAVENOUS
  Administered 2015-09-28: 5 mg via INTRAVENOUS
  Administered 2015-09-28: 10 mg via INTRAVENOUS

## 2015-09-28 MED ORDER — ONDANSETRON HCL 4 MG/2ML IJ SOLN: 4.0000 mg | Freq: Four times a day (QID) | INTRAMUSCULAR | Status: DC | PRN

## 2015-09-28 MED ORDER — SODIUM CHLORIDE 0.9 % IR SOLN
Status: DC | PRN
  Administered 2015-09-28: 1500 mL

## 2015-09-28 MED ORDER — PROPOFOL 10 MG/ML IV BOLUS
INTRAVENOUS | Status: DC | PRN
  Administered 2015-09-28: 150 mg via INTRAVENOUS

## 2015-09-28 MED ORDER — PROPOFOL 10 MG/ML IV BOLUS
INTRAVENOUS | Status: AC
  Filled 2015-09-28: qty 20

## 2015-09-28 MED ORDER — LEVOTHYROXINE SODIUM 75 MCG PO TABS
75.0000 ug | ORAL_TABLET | Freq: Every day | ORAL | Status: DC
  Administered 2015-09-30: 75 ug via ORAL
  Filled 2015-09-28 (×3): qty 1

## 2015-09-28 MED ORDER — GLYCOPYRROLATE 0.2 MG/ML IJ SOLN
INTRAMUSCULAR | Status: DC | PRN
  Administered 2015-09-28: 0.6 mg via INTRAVENOUS

## 2015-09-28 MED ORDER — FLUORESCEIN SODIUM 10 % IJ SOLN
INTRAMUSCULAR | Status: AC
  Filled 2015-09-28: qty 5

## 2015-09-28 MED ORDER — MIDAZOLAM HCL 5 MG/5ML IJ SOLN
INTRAMUSCULAR | Status: DC | PRN
  Administered 2015-09-28: 2 mg via INTRAVENOUS

## 2015-09-28 MED ORDER — OXYCODONE-ACETAMINOPHEN 5-325 MG PO TABS: 1.0000 | ORAL_TABLET | ORAL | Status: DC | PRN

## 2015-09-28 MED ORDER — BUPIVACAINE HCL (PF) 0.25 % IJ SOLN
INTRAMUSCULAR | Status: DC | PRN
  Administered 2015-09-28: 3 mL

## 2015-09-28 MED ORDER — SCOPOLAMINE 1 MG/3DAYS TD PT72
MEDICATED_PATCH | TRANSDERMAL | Status: DC | PRN
  Administered 2015-09-28: 1 via TRANSDERMAL

## 2015-09-28 MED ORDER — SODIUM CHLORIDE 0.9% FLUSH: 9.0000 mL | INTRAVENOUS | Status: DC | PRN

## 2015-09-28 MED ORDER — PROMETHAZINE HCL 25 MG/ML IJ SOLN
6.2500 mg | INTRAMUSCULAR | Status: DC | PRN
  Filled 2015-09-28: qty 1

## 2015-09-28 MED ORDER — SCOPOLAMINE 1 MG/3DAYS TD PT72
MEDICATED_PATCH | TRANSDERMAL | Status: AC
  Filled 2015-09-28: qty 1

## 2015-09-28 MED ORDER — CALCIUM CARBONATE-VITAMIN D 500-200 MG-UNIT PO TABS
1.0000 | ORAL_TABLET | Freq: Three times a day (TID) | ORAL | Status: DC
  Administered 2015-09-28: 1 via ORAL
  Filled 2015-09-28 (×11): qty 1

## 2015-09-28 MED ORDER — NEOSTIGMINE METHYLSULFATE 10 MG/10ML IV SOLN
INTRAVENOUS | Status: AC
  Filled 2015-09-28: qty 1

## 2015-09-28 MED ORDER — HYDROMORPHONE HCL 1 MG/ML IJ SOLN
0.5000 mg | INTRAMUSCULAR | Status: DC | PRN
  Administered 2015-09-28: 0.2 mg via INTRAVENOUS
  Administered 2015-09-28: 0.5 mg via INTRAVENOUS
  Filled 2015-09-28 (×2): qty 1

## 2015-09-28 MED ORDER — DIPHENHYDRAMINE HCL 12.5 MG/5ML PO ELIX: 12.5000 mg | ORAL_SOLUTION | Freq: Four times a day (QID) | ORAL | Status: DC | PRN

## 2015-09-28 MED ORDER — HYDROMORPHONE 1 MG/ML IV SOLN
INTRAVENOUS | Status: DC
  Administered 2015-09-28: 18:00:00 via INTRAVENOUS
  Administered 2015-09-28: 0.4 mg via INTRAVENOUS
  Filled 2015-09-28: qty 25

## 2015-09-28 MED ORDER — ONDANSETRON HCL 4 MG/2ML IJ SOLN
4.0000 mg | Freq: Once | INTRAMUSCULAR | Status: AC
  Administered 2015-09-28: 4 mg via INTRAVENOUS
  Filled 2015-09-28: qty 2

## 2015-09-28 MED ORDER — LIDOCAINE HCL 4 % EX SOLN
CUTANEOUS | Status: DC | PRN
  Administered 2015-09-28: 1 mL via TOPICAL

## 2015-09-28 MED ORDER — LIDOCAINE HCL (CARDIAC) 20 MG/ML IV SOLN
INTRAVENOUS | Status: DC | PRN
  Administered 2015-09-28: 25 mg via INTRAVENOUS

## 2015-09-28 MED ORDER — NEOSTIGMINE METHYLSULFATE 10 MG/10ML IV SOLN
INTRAVENOUS | Status: DC | PRN
  Administered 2015-09-28: 4 mg via INTRAVENOUS

## 2015-09-28 SURGICAL SUPPLY — 82 items
APPLICATOR COTTON TIP 6IN STRL (MISCELLANEOUS) ×3 IMPLANT
BAG DRAIN URO-CYSTO SKYTR STRL (DRAIN) ×3 IMPLANT
BAG DRN UROCATH (DRAIN) ×2
BAG SPEC RTRVL LRG 6X4 10 (ENDOMECHANICALS)
BANDAGE ADHESIVE 1X3 (GAUZE/BANDAGES/DRESSINGS) IMPLANT
BANDAGE CO FLEX L/F 1IN X 5YD (GAUZE/BANDAGES/DRESSINGS) ×1 IMPLANT
BLADE SURG 11 STRL SS (BLADE) ×3 IMPLANT
BLADE SURG 15 STRL LF DISP TIS (BLADE) IMPLANT
BLADE SURG 15 STRL SS (BLADE) ×3
CANISTER SUCT LVC 12 LTR MEDI- (MISCELLANEOUS) IMPLANT
CANISTER SUCTION 1200CC (MISCELLANEOUS) IMPLANT
CANISTER SUCTION 2500CC (MISCELLANEOUS) ×1 IMPLANT
CATH ROBINSON RED A/P 16FR (CATHETERS) IMPLANT
COVER MAYO STAND STRL (DRAPES) ×3 IMPLANT
DRAPE HYSTEROSCOPY (DRAPE) IMPLANT
DRAPE LAPAROTOMY TRNSV 102X78 (DRAPE) ×1 IMPLANT
DRAPE UNDERBUTTOCKS STRL (DRAPE) ×3 IMPLANT
DRAPE WARM FLUID 44X44 (DRAPE) ×1 IMPLANT
DRSG OPSITE POSTOP 4X10 (GAUZE/BANDAGES/DRESSINGS) ×1 IMPLANT
DRSG TELFA 3X8 NADH (GAUZE/BANDAGES/DRESSINGS) IMPLANT
ELECT REM PT RETURN 9FT ADLT (ELECTROSURGICAL) ×3
ELECTRODE REM PT RTRN 9FT ADLT (ELECTROSURGICAL) ×2 IMPLANT
FILTER SMOKE EVAC LAPAROSHD (FILTER) IMPLANT
GLOVE BIO SURGEON STRL SZ 6 (GLOVE) ×1 IMPLANT
GLOVE BIO SURGEON STRL SZ7 (GLOVE) ×7 IMPLANT
GLOVE BIOGEL PI IND STRL 6 (GLOVE) IMPLANT
GLOVE BIOGEL PI INDICATOR 6 (GLOVE) ×1
GOWN STRL REUS W/ TWL LRG LVL3 (GOWN DISPOSABLE) ×4 IMPLANT
GOWN STRL REUS W/ TWL XL LVL3 (GOWN DISPOSABLE) ×2 IMPLANT
GOWN STRL REUS W/TWL LRG LVL3 (GOWN DISPOSABLE) ×6
GOWN STRL REUS W/TWL XL LVL3 (GOWN DISPOSABLE) ×3
HOLDER FOLEY CATH W/STRAP (MISCELLANEOUS) ×3 IMPLANT
KIT ROOM TURNOVER WOR (KITS) ×3 IMPLANT
LAPAROSCOPY HANDPIECE LONG (MISCELLANEOUS) IMPLANT
LEGGING LITHOTOMY PAIR STRL (DRAPES) ×3 IMPLANT
LIQUID BAND (GAUZE/BANDAGES/DRESSINGS) ×3 IMPLANT
MANIFOLD NEPTUNE II (INSTRUMENTS) IMPLANT
NDL HYPO 25X1 1.5 SAFETY (NEEDLE) ×2 IMPLANT
NDL INSUFFLATION 14GA 120MM (NEEDLE) IMPLANT
NEEDLE HYPO 25X1 1.5 SAFETY (NEEDLE) ×3 IMPLANT
NEEDLE INSUFFLATION 14GA 120MM (NEEDLE) IMPLANT
NS IRRIG 500ML POUR BTL (IV SOLUTION) ×5 IMPLANT
PACK BASIN DAY SURGERY FS (CUSTOM PROCEDURE TRAY) ×3 IMPLANT
PACK LAPAROSCOPY II (CUSTOM PROCEDURE TRAY) ×3 IMPLANT
PAD DRESSING TELFA 3X8 NADH (GAUZE/BANDAGES/DRESSINGS) IMPLANT
PAD OB MATERNITY 4.3X12.25 (PERSONAL CARE ITEMS) ×3 IMPLANT
PAD PREP 24X48 CUFFED NSTRL (MISCELLANEOUS) ×3 IMPLANT
PADDING ION DISPOSABLE (MISCELLANEOUS) ×3 IMPLANT
POUCH SPECIMEN RETRIEVAL 10MM (ENDOMECHANICALS) IMPLANT
SCISSORS LAP 5X35 DISP (ENDOMECHANICALS) IMPLANT
SCISSORS LAP 5X45 EPIX DISP (ENDOMECHANICALS) IMPLANT
SEALER TISSUE G2 CVD JAW 35 (ENDOMECHANICALS) IMPLANT
SEALER TISSUE G2 CVD JAW 45CM (ENDOMECHANICALS) IMPLANT
SET IRRIG TUBING LAPAROSCOPIC (IRRIGATION / IRRIGATOR) IMPLANT
SET IRRIG Y TYPE TUR BLADDER L (SET/KITS/TRAYS/PACK) ×3 IMPLANT
SOLUTION ANTI FOG 6CC (MISCELLANEOUS) ×3 IMPLANT
SPONGE LAP 18X18 X RAY DECT (DISPOSABLE) ×2 IMPLANT
SUT VIC AB 0 CT1 18XCR BRD8 (SUTURE) IMPLANT
SUT VIC AB 0 CT1 36 (SUTURE) ×4 IMPLANT
SUT VIC AB 0 CT1 8-18 (SUTURE) ×3
SUT VIC AB 3-0 PS2 18 (SUTURE) ×6
SUT VIC AB 3-0 PS2 18XBRD (SUTURE) ×2 IMPLANT
SUT VICRYL 0 ENDOLOOP (SUTURE) IMPLANT
SUT VICRYL 0 TIES 12 18 (SUTURE) ×1 IMPLANT
SUT VICRYL 0 UR6 27IN ABS (SUTURE) ×1 IMPLANT
SUT VICRYL 4-0 PS2 18IN ABS (SUTURE) IMPLANT
SYR BULB IRRIGATION 50ML (SYRINGE) ×1 IMPLANT
SYR CONTROL 10ML LL (SYRINGE) ×3 IMPLANT
SYRINGE 10CC LL (SYRINGE) IMPLANT
TOWEL NATURAL 6PK STERILE (DISPOSABLE) ×2 IMPLANT
TOWEL OR 17X24 6PK STRL BLUE (TOWEL DISPOSABLE) ×6 IMPLANT
TRAY DSU PREP LF (CUSTOM PROCEDURE TRAY) ×4 IMPLANT
TRAY FOLEY CATH SILVER 14FR (SET/KITS/TRAYS/PACK) ×1 IMPLANT
TROCAR BALLN 12MMX100 BLUNT (TROCAR) ×1 IMPLANT
TROCAR OPTI TIP 5M 100M (ENDOMECHANICALS) ×4 IMPLANT
TROCAR XCEL DIL TIP R 11M (ENDOMECHANICALS) IMPLANT
TUBE CONNECTING 12X1/4 (SUCTIONS) ×1 IMPLANT
TUBING INSUFFLATION 10FT LAP (TUBING) ×3 IMPLANT
WARMER LAPAROSCOPE (MISCELLANEOUS) ×3 IMPLANT
WATER STERILE IRR 3000ML UROMA (IV SOLUTION) ×2 IMPLANT
WATER STERILE IRR 500ML POUR (IV SOLUTION) ×3 IMPLANT
YANKAUER SUCT BULB TIP NO VENT (SUCTIONS) ×1 IMPLANT

## 2015-09-28 NOTE — Anesthesia Postprocedure Evaluation (Signed)
Anesthesia Post Note  Patient: Beth Davis  Procedure(s) Performed: Procedure(s) (LRB):  ATTEMPTED LAPAROSCOPIC CONVERTED TO OPEN LAPAROSCOPY WITH BILATERAL SALPINGO OOPHORECTOMY AND  CERVICAL STUMP (Bilateral) CYSTOSCOPY (Bilateral)  Patient location during evaluation: PACU Anesthesia Type: General Level of consciousness: awake and alert Pain management: pain level controlled Vital Signs Assessment: post-procedure vital signs reviewed and stable Respiratory status: spontaneous breathing, nonlabored ventilation, respiratory function stable and patient connected to nasal cannula oxygen Cardiovascular status: blood pressure returned to baseline and stable Postop Assessment: no signs of nausea or vomiting Anesthetic complications: no    Last Vitals:  Filed Vitals:   09/28/15 1130 09/28/15 1145  BP: 109/65 110/61  Pulse: 58 60  Temp:    Resp: 12 15    Last Pain:  Filed Vitals:   09/28/15 1146  PainSc: Asleep                 Effie Berkshire

## 2015-09-28 NOTE — Transfer of Care (Signed)
    Last Vitals:  Filed Vitals:   09/28/15 0644  BP: 131/67  Pulse: 67  Temp: 37.1 C  Resp: 14    Immediate Anesthesia Transfer of Care Note  Patient: Beth Davis  Procedure(s) Performed: Procedure(s) (LRB):  ATTEMPTED LAPAROSCOPIC CONVERTED TO OPEN LAPAROSCOPY WITH BILATERAL SALPINGO OOPHORECTOMY AND  CERVICAL STUMP (Bilateral) CYSTOSCOPY (Bilateral)  Patient Location: PACU  Anesthesia Type: General  Level of Consciousness: awake, alert  and oriented  Airway & Oxygen Therapy: Patient Spontanous Breathing and Patient connected to face mask oxygen  Post-op Assessment: Report given to PACU RN and Post -op Vital signs reviewed and stable  Post vital signs: Reviewed and stable  Complications: No apparent anesthesia complications

## 2015-09-28 NOTE — Op Note (Signed)
Patient name DICTATION# CSN#   Darlyn Chamber, MD 09/28/2015 9:48 AM     Patient name DICTATION# CSN#   Darlyn Chamber, MD 09/28/2015 9:48 AM     Patient name  Beth Davis, Beth Davis DICTATION# [id number] CSN# [id number]  [facility], MD 09/28/2015 9:48 AM

## 2015-09-28 NOTE — H&P (Signed)
  History and physical exam unchanged 

## 2015-09-28 NOTE — Progress Notes (Signed)
Patient ID: Beth Davis, female   DOB: 1950/11/22, 65 y.o.   MRN: [id number] AF VSS ABD SOFT DRESSING DRY  GOOD UO

## 2015-09-28 NOTE — Anesthesia Procedure Notes (Signed)
Procedure Name: Intubation Date/Time: 09/28/2015 7:33 AM Performed by: Mechele Claude Pre-anesthesia Checklist: Patient identified, Emergency Drugs available, Suction available and Patient being monitored Patient Re-evaluated:Patient Re-evaluated prior to inductionOxygen Delivery Method: Circle System Utilized Preoxygenation: Pre-oxygenation with 100% oxygen Intubation Type: IV induction Ventilation: Mask ventilation without difficulty Laryngoscope Size: Mac and 3 Grade View: Grade I Tube type: Oral Number of attempts: 2 (tried once without stylet, second time with stylet was successful) Airway Equipment and Method: Stylet and LTA kit utilized Placement Confirmation: ETT inserted through vocal cords under direct vision,  positive ETCO2 and breath sounds checked- equal and bilateral Secured at: 20 cm Tube secured with: Tape Dental Injury: Teeth and Oropharynx as per pre-operative assessment

## 2015-09-28 NOTE — Addendum Note (Signed)
Addendum  created 09/28/15 1236 by Mechele Claude, CRNA   Modules edited: Anesthesia Flowsheet

## 2015-09-28 NOTE — Brief Op Note (Signed)
09/28/2015  9:46 AM  PATIENT:  Beth Davis  65 y.o. female  PRE-OPERATIVE DIAGNOSIS:  pelvic pain due to adhesions  POST-OPERATIVE DIAGNOSIS:  pelvic pain due to adhesions  PROCEDURE:  Procedure(s):  ATTEMPTED LAPAROSCOPIC CONVERTED TO OPEN LAPAROSCOPY WITH BILATERAL SALPINGO OOPHORECTOMY AND  CERVICAL STUMP (Bilateral) CYSTOSCOPY (Bilateral)  SURGEON:  Surgeon(s) and Role:    * Arvella Nigh, MD - Primary    * Linda Hedges, DO - Assisting  PHYSICIAN ASSISTANT:   ASSISTANTS: morris    ANESTHESIA:   general  EBL:  Total I/O In: 2500 [I.V.:2500] Out: 800 [Urine:250; Blood:550]  BLOOD ADMINISTERED:none  DRAINS: Urinary Catheter (Foley)   LOCAL MEDICATIONS USED:  MARCAINE     SPECIMEN:  Source of Specimen:  tubes and ovaries and cervical stump  DISPOSITION OF SPECIMEN:  PATHOLOGY  COUNTS:  YES  TOURNIQUET:  * No tourniquets in log *  DICTATION: .Other Dictation: Dictation Number [id number]  PLAN OF CARE: Admit for overnight observation  PATIENT DISPOSITION:  PACU - hemodynamically stable.   Delay start of Pharmacological VTE agent (>24hrs) due to surgical blood loss or risk of bleeding: not applicable

## 2015-09-29 ENCOUNTER — Encounter (HOSPITAL_BASED_OUTPATIENT_CLINIC_OR_DEPARTMENT_OTHER): Payer: Self-pay | Admitting: Obstetrics and Gynecology

## 2015-09-29 LAB — CBC
HEMATOCRIT: 27.5 % — AB (ref 36.0–46.0)
Hemoglobin: 9.4 g/dL — ABNORMAL LOW (ref 12.0–15.0)
MCH: 32.2 pg (ref 26.0–34.0)
MCHC: 34.2 g/dL (ref 30.0–36.0)
MCV: 94.2 fL (ref 78.0–100.0)
PLATELETS: 164 10*3/uL (ref 150–400)
RBC: 2.92 MIL/uL — ABNORMAL LOW (ref 3.87–5.11)
RDW: 12.8 % (ref 11.5–15.5)
WBC: 8 10*3/uL (ref 4.0–10.5)

## 2015-09-29 MED ORDER — PROMETHAZINE HCL 25 MG/ML IJ SOLN
12.5000 mg | INTRAMUSCULAR | Status: DC | PRN
  Administered 2015-09-29 – 2015-09-30 (×3): 12.5 mg via INTRAVENOUS
  Filled 2015-09-29 (×3): qty 1

## 2015-09-29 NOTE — Op Note (Signed)
NAMEMarland Kitchen  Beth Davis, Beth Davis NO.:  [id number]  MEDICAL RECORD NO.:  [id number]  LOCATION:  73                         FACILITY:  [facility]  PHYSICIAN:  Darlyn Chamber, M.D.   DATE OF BIRTH:  06/03/1951  DATE OF PROCEDURE:  09/28/2015 DATE OF DISCHARGE:                              OPERATIVE REPORT   PREOPERATIVE DIAGNOSIS:  Pelvic pain and pressure.  POSTOPERATIVE DIAGNOSIS:  Pelvic pain and pressure with finding of pelvic adhesions.  OPERATIVE PROCEDURES:  Open laparoscopy.  Exploratory laparotomy with removal of both tubes and ovaries and the cervical stump.  SURGEON:  Darlyn Chamber, M.D.  Terrence DupontLeamon Arnt.  ESTIMATED BLOOD LOSS:  600 mL.  PACKS AND DRAINS:  Include urethral Foley.  INTRAOPERATIVE BLOOD REPLACED:  None.  COMPLICATIONS:  None.  INDICATION:  Dictated in history and physical.  PROCEDURE IN DETAIL:  The patient was taken to the OR and placed in supine position.  After satisfactory level of general endotracheal anesthesia was obtained, the patient was placed in dorsal lithotomy position.  The abdomen, perineum, and vagina were prepped out with Betadine.  A Foley was placed to straight drain.  We grasped what looked like to be the cervical stump.  At this point in time, the patient was draped in sterile field.  Subumbilical incision made with knife, extended through the subcutaneous tissue.  Fascia was identified and entered sharply __________ laterally.  The peritoneum was entered with blunt finger pressure.  Open laparoscopic trocar was put in place and secured.  The abdomen was inflated with carbon dioxide.  The laparoscope was introduced.  A 5-mm trocar was put in place in suprapubic area under direct visualization.  There was no evidence of injury to adjacent organs.  The left ovary was densely adherent to the pelvic sidewall with overlying bowel.  The right ovary was adherent to the appendix and also bowel.  It was difficult to  ascertain what was the cervical stump.  At this point in time, decision was to proceed with exploratory surgery. The abdomen was deflated off carbon dioxide.  All laparoscopic __________ trocars were removed.  The subumbilical fascia was closed with figure-of-eight 0 Vicryl.  The skin was closed with interrupted subcuticular of 4-0 Vicryl.  The patient's legs were repositioned.  The single-tooth tenaculum was removed.  We put the patient in the supine position.  We re-prepped the abdomen and it was then draped with sterile field.  A subumbilical incision was made with knife, carried through subcutaneous tissue.  Fascia was entered sharply.  Incision was __________laterally.  Fascia taken off the muscle superiorly and inferiorly.  Rectus muscles were separated in the midline.  Perineum was entered sharply.  Incision of perineum was extended both superiorly and inferiorly.  A O'Connor-O'Sullivan retractor was put in place, and bowel contents were packed superiorly.  We first went to the left ovary, it was elevated.  We tried to identify the ureter on that side by developing the retroperitoneal space; however, this was difficult due to a large amount of fat.  We did end up with some bleeding on this side, brought under control with free ties of 0 Vicryl.  We identified the ovarian vasculature, elevated the ovary.  We had previously dissected the bowel off the ovary.  The left ovarian vessels were isolated, clamped, cut, and doubly ligated with free ties of 0 Vicryl and then suture ligature of 0 Vicryl.  We subsequently were able to dissect the ovary up to what appeared to be the cervical stump.  We clamped it, cut it, and the ovary was passed off the operative field.  The pedicle was secured with a free tie of 0 Vicryl.  We then went to the left ovary, carefully dissected the appendix free from the ovary.  At this point in time, it was elevated.  We developed the retroperitoneal space.   The right ovarian vasculature was isolated, clamped, cut, and doubly ligated first with a free tie of 0 Vicryl, then suture ligature of 0 Vicryl. The ovary was dissected up to the cervical stump.  Remaining pedicles were clamped and cut, and the ovary was passed off the operative field. At this point in time, we had to proceed to try and identify the cervical stump.  We put a sponge on a sponge stick in the vagina so that it could be elevated.  We then grasped the cervical stump with a single- tooth tenaculum.  Bladder flap was developed.  We grabbed __________ to be the cervical stump and made an incision down into the vagina.  The cervical stump was then excised from its attachment to the vagina and sent for pathological review.  Vagina was closed with interrupted figure- of-eights of 0 Vicryl.  Areas of bleeding controlled with figure-of- eights of 0 Vicryl.  We thoroughly irrigated the pelvis.  We had good hemostasis at this point in time.  The packs and retractor were then removed.  Perineum was closed with running suture of 0 Vicryl.  Fascia was closed with running suture of 0 PDS.  Skin was closed with a running subcuticular of 4-0 Monocryl and Dermabond.  The Foley was then removed.  The patient had been given fluorescein. The cystoscope was introduced.  The bladder was filled with irrigation. There was no evidence of injury to the bladder.  Both ureteral orifices were identified and noted to be spilling green-tinged urine.  At this point in time, the cystoscope was removed.  Foley was placed to straight drain.  The patient was taken out of the dorsal lithotomy position. Once alert and extubated, transferred to recovery room in good condition.  Sponge, instrument, and needle count reported as correct by circulating nurse x2.     Darlyn Chamber, M.D.     JSM/MEDQ  D:  09/28/2015  T:  09/28/2015  Job:  [id number]

## 2015-09-29 NOTE — Progress Notes (Addendum)
Discontinued PCA, patient is tolerating regular diet, and we will continue to monitor.

## 2015-09-29 NOTE — Progress Notes (Signed)
Patient refuse to remove foley at this time, states wait until she feels better to get up and go to the bathroom.

## 2015-09-29 NOTE — Progress Notes (Signed)
1 Day Post-Op Procedure(s) (LRB):  ATTEMPTED LAPAROSCOPIC CONVERTED TO OPEN LAPAROSCOPY WITH BILATERAL SALPINGO OOPHORECTOMY AND  CERVICAL STUMP (Bilateral) CYSTOSCOPY (Bilateral)  Subjective: Patient reports nausea.    Objective: I have reviewed patient's vital signs and labs.  General: alert GI: soft, non-tender; bowel sounds normal; no masses,  no organomegaly Vaginal Bleeding: minimal  Assessment: s/p Procedure(s):  ATTEMPTED LAPAROSCOPIC CONVERTED TO OPEN LAPAROSCOPY WITH BILATERAL SALPINGO OOPHORECTOMY AND  CERVICAL STUMP (Bilateral) CYSTOSCOPY (Bilateral): not tolerating diet  Plan: Encourage ambulation     Emeril Stille S 09/29/2015, 7:05 AM

## 2015-09-30 MED ORDER — OXYCODONE-ACETAMINOPHEN 7.5-325 MG PO TABS: 1.0000 | ORAL_TABLET | ORAL | Status: DC | PRN

## 2015-09-30 NOTE — Progress Notes (Signed)
Discharge instructions given to patient.

## 2015-09-30 NOTE — Discharge Summary (Signed)
NAMEMarland Kitchen  AHONESTY, VIELE NO.:  [id number]  MEDICAL RECORD NO.:  [id number]  LOCATION:  [id number]                         FACILITY:  [facility]  PHYSICIAN:  Darlyn Chamber, M.D.   DATE OF BIRTH:  [birth year]  DATE OF ADMISSION:  09/28/2015 DATE OF DISCHARGE:  09/29/2015                              DISCHARGE SUMMARY   ADMITTING DIAGNOSIS:  Pelvic pain secondary to pelvic adhesions.  POSTOPERATIVE DIAGNOSIS:  Pelvic pain secondary to pelvic adhesions.  OPERATIVE PROCEDURE:  Open laparoscopy.  Subsequent exploratory laparotomy with bilateral salpingo-oophorectomy and excision of cervical stump.  For complete history and physical, please see dictated note.  COURSE IN THE HOSPITAL:  The patient underwent the above-noted surgery. On her first postop day, she was having some nausea.  We decided to keep her that day.  The following day, she was tolerating her diet.  She was ambulating without difficulty.  She was passing flatus and voiding without difficulty.  She was afebrile at that time.  Her postop hemoglobin was 9.4.  In terms of complication, she had a small postop ileus, but is doing well at the present time.  She will be discharged home in stable condition.  DISPOSITION:  She is to avoid heavy lifting, vaginal entrance, or driving of a car.  She should call with signs of infection, nausea, vomiting, active vaginal bleeding or significant pelvic pain.  Also, instructed of signs and symptoms of deep venous thrombosis and pulmonary embolus.  She will be sent home on Percocet as needed for pain.  The office will call her tomorrow and arrange followup.     Darlyn Chamber, M.D.     JSM/MEDQ  D:  09/30/2015  T:  09/30/2015  Job:  [id number]

## 2015-09-30 NOTE — Discharge Summary (Signed)
  Patient name Beth Davis, Beth Davis DICTATION# [id number] CSN# [id number]  Darlyn Chamber, MD 09/30/2015 12:57 PM

## 2015-09-30 NOTE — Progress Notes (Signed)
2 Days Post-Op Procedure(s) (LRB):  ATTEMPTED LAPAROSCOPIC CONVERTED TO OPEN LAPAROSCOPY WITH BILATERAL SALPINGO OOPHORECTOMY AND  CERVICAL STUMP (Bilateral) CYSTOSCOPY (Bilateral)  Subjective: Patient reports tolerating PO, + flatus and no problems voiding.    Objective: I have reviewed patient's vital signs.  General: alert GI: soft, non-tender; bowel sounds normal; no masses,  no organomegaly and incision: clean Vaginal Bleeding: none  Assessment: s/p Procedure(s):  ATTEMPTED LAPAROSCOPIC CONVERTED TO OPEN LAPAROSCOPY WITH BILATERAL SALPINGO OOPHORECTOMY AND  CERVICAL STUMP (Bilateral) CYSTOSCOPY (Bilateral): stable  Plan: Advance diet  LOS: 1 day    Beth Davis S 09/30/2015, 7:55 AM

## 2015-10-06 ENCOUNTER — Observation Stay (HOSPITAL_COMMUNITY)
Admission: AD | Admit: 2015-10-06 | Discharge: 2015-10-07 | Disposition: A | Discharge status: Home / Self Care | Payer: BLUE CROSS/BLUE SHIELD | Source: Ambulatory Visit | Attending: Urology | Admitting: Urology

## 2015-10-06 ENCOUNTER — Observation Stay (HOSPITAL_COMMUNITY): Payer: BLUE CROSS/BLUE SHIELD

## 2015-10-06 ENCOUNTER — Encounter (HOSPITAL_COMMUNITY): Payer: Self-pay | Admitting: *Deleted

## 2015-10-06 ENCOUNTER — Encounter (HOSPITAL_COMMUNITY)
Admission: AD | Disposition: A | Discharge status: Home / Self Care | Payer: Self-pay | Source: Ambulatory Visit | Attending: Urology

## 2015-10-06 ENCOUNTER — Other Ambulatory Visit: Payer: Self-pay | Admitting: Urology

## 2015-10-06 ENCOUNTER — Inpatient Hospital Stay (HOSPITAL_COMMUNITY): Payer: BLUE CROSS/BLUE SHIELD | Admitting: Certified Registered Nurse Anesthetist

## 2015-10-06 ENCOUNTER — Ambulatory Visit (HOSPITAL_COMMUNITY)
Admission: RE | Admit: 2015-10-06 | Discharge: 2015-10-06 | Disposition: A | Discharge status: Home / Self Care | Payer: BLUE CROSS/BLUE SHIELD | Source: Ambulatory Visit | Attending: Urology | Admitting: Urology

## 2015-10-06 DIAGNOSIS — Z90722 Acquired absence of ovaries, bilateral: Secondary | ICD-10-CM | POA: Insufficient documentation

## 2015-10-06 DIAGNOSIS — Z9079 Acquired absence of other genital organ(s): Secondary | ICD-10-CM | POA: Insufficient documentation

## 2015-10-06 DIAGNOSIS — Z79899 Other long term (current) drug therapy: Secondary | ICD-10-CM | POA: Insufficient documentation

## 2015-10-06 DIAGNOSIS — M810 Age-related osteoporosis without current pathological fracture: Secondary | ICD-10-CM | POA: Insufficient documentation

## 2015-10-06 DIAGNOSIS — N1330 Unspecified hydronephrosis: Secondary | ICD-10-CM | POA: Diagnosis present

## 2015-10-06 DIAGNOSIS — Z9013 Acquired absence of bilateral breasts and nipples: Secondary | ICD-10-CM | POA: Diagnosis not present

## 2015-10-06 DIAGNOSIS — Z87891 Personal history of nicotine dependence: Secondary | ICD-10-CM | POA: Diagnosis not present

## 2015-10-06 DIAGNOSIS — Z9221 Personal history of antineoplastic chemotherapy: Secondary | ICD-10-CM | POA: Insufficient documentation

## 2015-10-06 DIAGNOSIS — M1990 Unspecified osteoarthritis, unspecified site: Secondary | ICD-10-CM | POA: Diagnosis not present

## 2015-10-06 DIAGNOSIS — Z90710 Acquired absence of both cervix and uterus: Secondary | ICD-10-CM | POA: Insufficient documentation

## 2015-10-06 DIAGNOSIS — N131 Hydronephrosis with ureteral stricture, not elsewhere classified: Principal | ICD-10-CM | POA: Insufficient documentation

## 2015-10-06 DIAGNOSIS — Z7982 Long term (current) use of aspirin: Secondary | ICD-10-CM | POA: Insufficient documentation

## 2015-10-06 DIAGNOSIS — G8929 Other chronic pain: Secondary | ICD-10-CM | POA: Diagnosis not present

## 2015-10-06 DIAGNOSIS — Z853 Personal history of malignant neoplasm of breast: Secondary | ICD-10-CM | POA: Diagnosis not present

## 2015-10-06 DIAGNOSIS — E039 Hypothyroidism, unspecified: Secondary | ICD-10-CM | POA: Diagnosis not present

## 2015-10-06 HISTORY — PX: CYSTOSCOPY W/ URETERAL STENT PLACEMENT: SHX1429

## 2015-10-06 SURGERY — CYSTOSCOPY, WITH RETROGRADE PYELOGRAM AND URETERAL STENT INSERTION
Anesthesia: General | Site: Ureter | Laterality: Bilateral

## 2015-10-06 MED ORDER — LACTATED RINGERS IV SOLN
INTRAVENOUS | Status: DC | PRN
  Administered 2015-10-06 (×2): via INTRAVENOUS

## 2015-10-06 MED ORDER — FENTANYL CITRATE (PF) 100 MCG/2ML IJ SOLN
INTRAMUSCULAR | Status: AC
  Filled 2015-10-06: qty 6

## 2015-10-06 MED ORDER — SODIUM CHLORIDE 0.9 % IR SOLN
Status: DC | PRN
  Administered 2015-10-06: 3000 mL

## 2015-10-06 MED ORDER — MIDAZOLAM HCL 5 MG/5ML IJ SOLN
INTRAMUSCULAR | Status: AC | PRN
  Administered 2015-10-06: 0.5 mg via INTRAVENOUS

## 2015-10-06 MED ORDER — LIDOCAINE HCL (CARDIAC) 20 MG/ML IV SOLN
INTRAVENOUS | Status: AC
  Filled 2015-10-06: qty 5

## 2015-10-06 MED ORDER — IOHEXOL 300 MG/ML  SOLN
50.0000 mL | Freq: Once | INTRAMUSCULAR | Status: AC | PRN
  Administered 2015-10-06: 5 mL

## 2015-10-06 MED ORDER — CIPROFLOXACIN IN D5W 400 MG/200ML IV SOLN
INTRAVENOUS | Status: DC | PRN
  Administered 2015-10-06: 400 mg via INTRAVENOUS

## 2015-10-06 MED ORDER — MIDAZOLAM HCL 2 MG/2ML IJ SOLN
INTRAMUSCULAR | Status: AC
  Filled 2015-10-06: qty 6

## 2015-10-06 MED ORDER — FENTANYL CITRATE (PF) 100 MCG/2ML IJ SOLN: 25.0000 ug | INTRAMUSCULAR | Status: DC | PRN

## 2015-10-06 MED ORDER — PROMETHAZINE HCL 25 MG/ML IJ SOLN: 6.2500 mg | INTRAMUSCULAR | Status: DC | PRN

## 2015-10-06 MED ORDER — FENTANYL CITRATE (PF) 100 MCG/2ML IJ SOLN
INTRAMUSCULAR | Status: AC | PRN
  Administered 2015-10-06 (×2): 25 ug via INTRAVENOUS

## 2015-10-06 MED ORDER — FENTANYL CITRATE (PF) 100 MCG/2ML IJ SOLN
INTRAMUSCULAR | Status: AC
  Filled 2015-10-06: qty 2

## 2015-10-06 MED ORDER — ONDANSETRON HCL 4 MG/2ML IJ SOLN
INTRAMUSCULAR | Status: AC
  Filled 2015-10-06: qty 2

## 2015-10-06 MED ORDER — IOHEXOL 300 MG/ML  SOLN
INTRAMUSCULAR | Status: DC | PRN
Start: 2015-10-06 — End: 2015-10-06
  Administered 2015-10-06: 10 mL

## 2015-10-06 MED ORDER — EPHEDRINE SULFATE 50 MG/ML IJ SOLN
INTRAMUSCULAR | Status: DC | PRN
  Administered 2015-10-06: 10 mg via INTRAVENOUS

## 2015-10-06 MED ORDER — FENTANYL CITRATE (PF) 100 MCG/2ML IJ SOLN
INTRAMUSCULAR | Status: DC | PRN
  Administered 2015-10-06: 25 ug via INTRAVENOUS

## 2015-10-06 MED ORDER — ONDANSETRON HCL 4 MG/2ML IJ SOLN
INTRAMUSCULAR | Status: DC | PRN
  Administered 2015-10-06: 4 mg via INTRAVENOUS

## 2015-10-06 MED ORDER — 0.9 % SODIUM CHLORIDE (POUR BTL) OPTIME
TOPICAL | Status: DC | PRN
  Administered 2015-10-06: 1000 mL

## 2015-10-06 MED ORDER — LIDOCAINE HCL 1 % IJ SOLN
INTRAMUSCULAR | Status: AC
  Filled 2015-10-06: qty 20

## 2015-10-06 MED ORDER — PROPOFOL 10 MG/ML IV BOLUS
INTRAVENOUS | Status: AC
Start: 2015-10-06 — End: 2015-10-06
  Filled 2015-10-06: qty 20

## 2015-10-06 MED ORDER — IOHEXOL 300 MG/ML  SOLN
125.0000 mL | Freq: Once | INTRAMUSCULAR | Status: AC | PRN
  Administered 2015-10-06: 125 mL via INTRAVENOUS

## 2015-10-06 MED ORDER — MIDAZOLAM HCL 2 MG/2ML IJ SOLN
INTRAMUSCULAR | Status: AC
  Filled 2015-10-06: qty 2

## 2015-10-06 MED ORDER — CIPROFLOXACIN IN D5W 400 MG/200ML IV SOLN
INTRAVENOUS | Status: AC
  Filled 2015-10-06: qty 200

## 2015-10-06 MED ORDER — SODIUM CHLORIDE 0.9 % IR SOLN
Status: DC | PRN
  Administered 2015-10-06: 2000 mL

## 2015-10-06 MED ORDER — MIDAZOLAM HCL 2 MG/2ML IJ SOLN
INTRAMUSCULAR | Status: AC | PRN
  Administered 2015-10-06: 0.5 mg via INTRAVENOUS

## 2015-10-06 SURGICAL SUPPLY — 24 items
BAG URINE DRAINAGE (UROLOGICAL SUPPLIES) ×2 IMPLANT
BAG URO CATCHER STRL LF (MISCELLANEOUS) ×3 IMPLANT
CATH FOLEY 2WAY SLVR  5CC 16FR (CATHETERS) ×2
CATH FOLEY 2WAY SLVR 5CC 16FR (CATHETERS) IMPLANT
CATH INTERMIT  6FR 70CM (CATHETERS) ×1 IMPLANT
CATH URET 5FR 28IN OPEN ENDED (CATHETERS) ×2 IMPLANT
CLOTH BEACON ORANGE TIMEOUT ST (SAFETY) ×3 IMPLANT
FIBER LASER FLEXIVA 200 (UROLOGICAL SUPPLIES) ×1 IMPLANT
GLOVE BIOGEL M STRL SZ7.5 (GLOVE) ×2 IMPLANT
GLOVE BIOGEL PI IND STRL 7.0 (GLOVE) IMPLANT
GLOVE BIOGEL PI INDICATOR 7.0 (GLOVE) ×2
GLOVE SURG SS PI 6.5 STRL IVOR (GLOVE) ×2 IMPLANT
GOWN STRL REUS W/TWL LRG LVL3 (GOWN DISPOSABLE) ×6 IMPLANT
GUIDEWIRE STR DUAL SENSOR (WIRE) ×3 IMPLANT
HOLDER FOLEY CATH W/STRAP (MISCELLANEOUS) ×2 IMPLANT
IV NS 1000ML (IV SOLUTION) ×6
IV NS 1000ML BAXH (IV SOLUTION) IMPLANT
IV NS IRRIG 3000ML ARTHROMATIC (IV SOLUTION) ×2 IMPLANT
MANIFOLD NEPTUNE II (INSTRUMENTS) ×3 IMPLANT
NS IRRIG 1000ML POUR BTL (IV SOLUTION) ×2 IMPLANT
PACK CYSTO (CUSTOM PROCEDURE TRAY) ×3 IMPLANT
SYRINGE 10CC LL (SYRINGE) ×2 IMPLANT
TUBING CONNECTING 10 (TUBING) ×2 IMPLANT
TUBING CONNECTING 10' (TUBING) ×1

## 2015-10-06 NOTE — Anesthesia Postprocedure Evaluation (Signed)
Anesthesia Post Note  Patient: Beth Davis  Procedure(s) Performed: Procedure(s) (LRB): CYSTOSCOPY WITH BILATERAL  RETROGRADE PYELOGRAM / LEFT SEMI-RIGID URETEROSCOPY/ LEFT URETERAL EXPLORATION (Bilateral)  Patient location during evaluation: PACU Anesthesia Type: General Level of consciousness: awake Pain management: pain level controlled Vital Signs Assessment: post-procedure vital signs reviewed and stable Respiratory status: spontaneous breathing Cardiovascular status: stable Anesthetic complications: no    Last Vitals:  Filed Vitals:   10/06/15 2155 10/06/15 2200  BP: 146/90 163/90  Pulse: 88 76  Temp: 36.4 C   Resp: 12 13    Last Pain:  Filed Vitals:   10/06/15 2201  PainSc: 0-No pain                 EDWARDS,Aaren Krog

## 2015-10-06 NOTE — Consult Note (Signed)
Chief Complaint: Patient was seen in consultation today for right flank pain at the request of Dr. Burman Nieves  Referring Physician(s): Louis Meckel  History of Present Illness: Beth Davis is a 65 y.o. female with right flank pain with associated hydronephrosis one-week status post pelvic exploration for chronic pain.    Flank pain developed postoperative day #3 or 4.  Ultrasound identified left hydronephrosis. Her pain is severe.  She underwent cystoscopy this evening and attempted crossing of the left ureter.  Unfortunately this was unsuccessful.  IR was consulted for percutaneous nephrostomy tube placement for renal decompression and pain relief.   Pt is in post-op and awake but groggy.  Care was dicussed with both her and her husband via telephone.  She agrees to the procedure and her husband gave formal telephone consent witnessed by Dr. Louis Meckel.  The risks including bleeding, damage to kidney with ;loss of renal function and infection were explained in detail.     Past Medical History  Diagnosis Date  . Hypothyroidism   . Osteoporosis   . Wears glasses   . Family history of breast cancer   . Family history of melanoma   . Breast cancer, IDC, Left, ER +, PR -, Her2 -. dx 06/01/2011--- oncologist-  dr Jana Hakim-- per last note no recurrence    Stage IA (T1b, N0), grade 2---- s/p  left total mastectomy w/ node disssection & right prophylactic total mastectomy 06-14-2011  and chemotherapy complete 10-03-2011 (anti-estrogen therapry ended 09/ 2014)  . Pelvic adhesions   . Pelvic pain in female   . Migraine   . History of colitis     COLLAGENOUS  . PONV (postoperative nausea and vomiting)   . Arthritis     Past Surgical History  Procedure Laterality Date  . Orif femur fracture  1969    and Humerus decompression  . Colonoscopy  2012  . Portacath placement  07/26/2011    Procedure: INSERTION PORT-A-CATH;  Surgeon: Haywood Lasso, MD;  Location: [place];  Service: General;  Laterality: N/A;  port a cath placement  . Abdominal hysterectomy  2006  . Port-a-cath removal  10/24/2011    Procedure: MINOR REMOVAL PORT-A-CATH;  Surgeon: Haywood Lasso, MD;  Location: [place];  Service: General;  Laterality: Right;  removal of portacath  . Mass excision  03/12/2012    Procedure: MINOR EXCISION OF MASS;  Surgeon: Haywood Lasso, MD;  Location: [place];  Service: General;  Laterality: Right;  removal part of port-a-cath scar  . Carpal tunnel release Right 02/05/2013    Procedure: CARPAL TUNNEL RELEASE;  Surgeon: Cammie Sickle., MD;  Location: [place];  Service: Orthopedics;  Laterality: Right;  . Laparoscopic ovarian cystectomy  2003  . Breast surgery  2004    excision left breast fibroadenoma  . Left total mastectomy with lymph node dissection/  right prophylactic mastectomy  06-14-2011  . Negative sleep study  2016  per pt  . Laparoscopic bilateral salpingo oopherectomy Bilateral 09/28/2015    Procedure:  ATTEMPTED LAPAROSCOPIC CONVERTED TO OPEN LAPAROSCOPY WITH BILATERAL SALPINGO OOPHORECTOMY AND  CERVICAL STUMP;  Surgeon: Arvella Nigh, MD;  Location: [place];  Service: Gynecology;  Laterality: Bilateral;  . Cystoscopy Bilateral 09/28/2015    Procedure: CYSTOSCOPY;  Surgeon: Arvella Nigh, MD;  Location: [facility];  Service: Gynecology;  Laterality: Bilateral;    Allergies: Erythromycin; Penicillins; and Reglan  Medications:  Prior to Admission medications   Medication Sig Start Date End Date Taking? Authorizing Provider  aspirin EC 325 MG tablet Take 325 mg by mouth every 4 (four) hours as needed.   Yes Historical Provider, MD  calcium citrate-vitamin D (CITRACAL+D) 315-200 MG-UNIT per tablet Take 1 tablet by mouth 4 (four) times daily.   Yes Historical Provider, MD  cholecalciferol (VITAMIN D) 1000 UNITS tablet Take 1,000 Units by  mouth daily.     Yes Historical Provider, MD  frovatriptan (FROVA) 2.5 MG tablet Take 2.5 mg by mouth as needed for migraine. If recurs, may repeat after 2 hours. Max of 3 tabs in 24 hours.   Yes Historical Provider, MD  levothyroxine (SYNTHROID, LEVOTHROID) 75 MCG tablet Take 75 mcg by mouth daily before breakfast.   Yes Historical Provider, MD  oxyCODONE-acetaminophen (PERCOCET) 7.5-325 MG tablet Take 1 tablet by mouth every 4 (four) hours as needed for severe pain. 09/30/15   Richardean Chimera, MD     Family History  Problem Relation Age of Onset  . Breast cancer Mother 48  . Dementia Mother   . Breast cancer Cousin 59    paternal cousin with bilateral breast cancer  . Heart attack Father 99  . Melanoma Father 82  . Melanoma Brother 31  . Heart attack Maternal Aunt 38  . Parkinson's disease Paternal Aunt   . Heart attack Maternal Grandmother 76  . COPD Maternal Grandfather 39  . Hypertension Paternal Grandmother   . Stroke Paternal Grandmother   . Dementia Maternal Aunt   . Ovarian cancer Other     MGM's mother    Social History   Social History  . Marital Status: Married    Spouse Name: [name]  . Number of Children: 2  . 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 Smoking status: Former Smoker -- 0.50 packs/day for 6 years    Types: Cigarettes    Quit date: 07/21/1975  . Smokeless tobacco: Never Used  . Alcohol Use: No  . Drug Use: No  . Sexual Activity: Not Asked     Comment: menarche 14, P2, 1st pregnancy 33, BCP x 2 yrs, Clomid x 28yrs   Other Topics Concern  . None   Social History Narrative    Review of Systems: A 12 point ROS discussed and pertinent positives are indicated in the HPI above.  All other systems are negative.  Review of Systems  Vital Signs: BP 120/70 mmHg  Pulse 75  Temp(Src) 98 F (36.7 C) (Oral)  Resp 20  Ht 5\' 3"  (1.6 m)  Wt 110 lb (49.896 kg)  BMI 19.49 kg/m2  SpO2  99%  Physical Exam  Constitutional: She appears well-developed and well-nourished.  HENT:  Head: Normocephalic and atraumatic.  Eyes: No scleral icterus.  Cardiovascular: Normal rate.   Pulmonary/Chest: Effort normal.  Abdominal: Soft. She exhibits no distension.  Skin: Skin is warm and dry.  Psychiatric: She has a normal mood and affect. Her behavior is normal.  Vitals reviewed.   Mallampati Score:  MD Evaluation Airway: WNL Heart: WNL Abdomen: WNL Chest/ Lungs: WNL ASA  Classification: 1 Mallampati/Airway Score: One  Imaging: Ct Abdomen Pelvis W Wo Contrast  10/06/2015  CLINICAL DATA:  Hydronephrosis, concern for ureteral injury post pelvic exploration. Patient post pelvic exploration for chronic pain 1 week prior, with difficulty intraoperatively identifying the left ureter. Flank pain developed postoperative day 3 or 4,  ultrasound in the office demonstrating left hydronephrosis. Severe left flank pain with nausea. EXAM: CT ABDOMEN AND PELVIS WITHOUT AND WITH CONTRAST TECHNIQUE: Multidetector CT imaging of the abdomen and pelvis was performed following the standard protocol before and following the bolus administration of intravenous contrast. CONTRAST:  159m OMNIPAQUE IOHEXOL 300 MG/ML  SOLN COMPARISON:  None. FINDINGS: Lower chest: The included lung bases are clear. No pleural effusion. Heart size is normal. Liver: 7 mm hypodensity in the subcapsular right lobe with tiny additional subcentimeter hepatic hypodensities, too small to characterize but likely cysts or hamartomas. No suspicious hepatic lesion. Hepatobiliary: Calcified gallstone in the gallbladder, no biliary inflammation. No biliary dilatation. Pancreas: No ductal dilatation or inflammation. Spleen: Normal. Adrenal glands: No nodule. Kidneys: Precontrast imaging demonstrates no urolithiasis. There is left hydronephrosis. Portal venous phase imaging demonstrate delayed enhancement of the left kidney. Excretory phase imaging  demonstrates excretion from the left kidney, with minimal prominence of the left proximal ureter, however no gross ureteral dilatation. There are lobular filling defects within the mid in the left renal collecting system. Contrast opacifies the mid and proximal ureter which is not significantly dilated. The distal most ureter is not well seen. There is no perinephric or definite periureteric collection. Small ovoid left pelvic sidewall fluid collection measures 2.3 x 0.9 cm, does not opacify with contrast, however the ureteral opacification does not extend to this level. Right ureter appears normal throughout its course. No filling defects in the right renal collecting system. Stomach/Bowel: Stomach physiologically distended. There are no dilated or thickened small bowel loops. Moderate volume of stool throughout the colon without colonic wall thickening. The appendix is not confidently identified. Vascular/Lymphatic: No retroperitoneal adenopathy. Abdominal aorta is normal in caliber. Left ovarian venous collaterals. Reproductive: Post hysterectomy and bilateral salpingo oophorectomy. Soft tissue stranding in the pelvis post recent surgery. Small left pelvic sidewall fluid collection as described. Small foci of extraluminal air, not unexpected 1 week post surgery. No evidence of pelvic abscess. Bladder: Minimally distended. Equivocal wall thickening involving the posterior left bladder in the region of the urinary trigone. Other: Scattered air in the anterior abdominal wall musculature and subcutaneous tissues, can see stent with recent surgery. Associated soft tissue stranding at the anterior abdominal wall surgical sites. Musculoskeletal: There are no acute or suspicious osseous abnormalities. IMPRESSION: 1. Left hydronephrosis. Left ureter is not dilated. No evidence of large urinoma. There is a small left pelvic sidewall fluid collection that is not opacified with contrast on delayed phase imaging that is  nonspecific. This may simply represent postsurgical change. A small urinoma is not entirely excluded, but felt less likely. Lobular hypodensity within the left renal collecting system, more lobular in shape than to be expected for layering urine and contrast. Blood clot versus underlying lesions such as transitional cell carcinoma are considered. 2. No evidence pelvic abscess. These results were called by telephone at the time of interpretation on 10/06/2015 at 8:00 pm to Dr. BLouis Meckel, who verbally acknowledged these results. Electronically Signed   By: MJeb LeveringM.D.   On: 10/06/2015 20:10    Labs:  CBC:  Recent Labs  12/11/14 1338 09/18/15 1406 09/28/15 0633 09/28/15 0700 09/29/15 0442  WBC 4.3 6.2 3.7*  --  8.0  HGB 13.0 13.5 13.0 13.9 9.4*  HCT 39.0 40.5 39.2 41.0 27.5*  PLT 221 226 212  --  164    COAGS: No results for input(s): INR, APTT in the last 8760 hours.  BMP:  Recent Labs  12/11/14 1338 09/18/15 1406 09/28/15 0633 09/28/15 0700  NA 139 143 143 143  K 4.6 4.6 4.1 4.1  CL  --  104 106 102  CO2 '26 28 30  '$ --   GLUCOSE 117 114* 104* 97  BUN 16.'1 14 12 12  '$ CALCIUM 9.8 10.3 9.6  --   CREATININE 0.8 0.87 0.66 0.60  GFRNONAA  --  >60 >60  --   GFRAA  --  >60 >60  --     LIVER FUNCTION TESTS:  Recent Labs  12/11/14 1338  BILITOT 0.51  AST 15  ALT 17  ALKPHOS 44  PROT 6.5  ALBUMIN 4.2    TUMOR MARKERS: No results for input(s): AFPTM, CEA, CA199, CHROMGRNA in the last 8760 hours.  Assessment and Plan:  Symptomatic acute LEFT hydronephrosis s/p difficult pelvic surgery.  Concern for ureteral occlusion, possible surgical ligation.  Unsuccessful attempted double J stent placement.  Perc neph tube indicate dfor renal decompression and symptom relief.   1.) To IR for left PCN placement 2.) After healing (1-2 weeks) can attempt antegrade crossing of the occluded ureter.  If successful, PTA of the ureteral occlusion and double J stent  placement until ureter has healed.    Thank you for this interesting consult.  I greatly enjoyed meeting Beth Davis and look forward to participating in their care.  A copy of this report was sent to the requesting provider on this date.  Electronically Signed: Jacqulynn Cadet 10/06/2015, 11:11 PM   I spent a total of 20 Minutes  in face to face in clinical consultation, greater than 50% of which was counseling/coordinating care for left flank pain and hydronephrosis.

## 2015-10-06 NOTE — Op Note (Signed)
Preoperative diagnosis:  1. Left hydronephrosis   Postoperative diagnosis:  1. Same   Procedure: Cystoscopy, bilateral retrograde pyelogram, left ureteroscopy   Surgeon: Ardis Hughs, MD  Anesthesia: General  Complications: None  Intraoperative findings:  Cystoscopic evaluation was normal, the ureteral orifices were in orthotopic position. There were no mucosal abnormalities within the bladder. The patient's right retrograde pyelogram today showed a normal caliber ureter with sharp calyces and no filling defects. The patient's left retrograde pyelogram ended abruptly right at the UVJ. I was unable to advance a wire beyond this. I then tried to use a ureteroscope and was able to navigate the intramural ureter but unsuccessfully navigate beyond this into the true lumen of the ureter. After approximately 45 minutes of looking for the ureter we terminated the case.  EBL: Minimal  Specimens: None  Indication: Beth Davis is a 65 y.o. patient with  left hydronephrosis following pelvic surgery.  After reviewing the management options for treatment, he elected to proceed with the above surgical procedure(s). We have discussed the potential benefits and risks of the procedure, side effects of the proposed treatment, the likelihood of the patient achieving the goals of the procedure, and any potential problems that might occur during the procedure or recuperation. Informed consent has been obtained.  Description of procedure:  The patient was taken to the operating room and general anesthesia was induced.  The patient was placed in the dorsal lithotomy position, prepped and draped in the usual sterile fashion, and preoperative antibiotics were administered. A preoperative time-out was performed.   I then passed a 21 French cystoscope into the patient's bladder under visual guidance. Through 60 cystoscopic evaluation was performed with the above findings. Then cannulated the right  ureteral orifice and performed retrograde pyelogram with the above findings. I then attempted to cannulate the left ureter and injected contrast unsuccessfully. I then attempted to advance a wire which I was unable to pass beyond to have centimeters from the ureteral orifice.  I then cannulated the left ureteral orifice with a long narrow rigid ureteroscope. I was able to navigate the inter-mural ureter but unable to advance beyond this into the true lumen of the ureter. There were numerous false passages with the wire. Ultimately, I was unable to get beyond the ureteral obstruction and into the true lumen. As such, a Foley catheter was placed, and the patient was awoken and returned to the PACU in stable condition.   Disposition: The patient was sent to interventional radiology for placement of a left nephrostomy tube. She will be admitted for overnight observation.  Ardis Hughs, M.D.

## 2015-10-06 NOTE — Procedures (Signed)
Interventional Radiology Procedure Note  Procedure: Placement of a 27F percutaneous nephrostomy tube into the LEFT kidney.    Complications: None  Estimated Blood Loss:  <25 mL  Recommendations: - Admit for obs - Drain to bag drainage - Return to IR in 1-2 weeks for attempted crossing of suspected ureteral occlusion.  If able to cross can consider PTA and double J placement.   Signed,  Criselda Peaches, MD

## 2015-10-06 NOTE — Anesthesia Procedure Notes (Signed)
Procedure Name: LMA Insertion Performed by: Gean Maidens Pre-anesthesia Checklist: Patient identified, Emergency Drugs available, Suction available, Patient being monitored and Timeout performed Patient Re-evaluated:Patient Re-evaluated prior to inductionOxygen Delivery Method: Circle system utilized Preoxygenation: Pre-oxygenation with 100% oxygen Intubation Type: IV induction Ventilation: Mask ventilation without difficulty LMA: LMA inserted LMA Size: 3.0 Number of attempts: 1 Tube secured with: Tape Dental Injury: Teeth and Oropharynx as per pre-operative assessment

## 2015-10-06 NOTE — Progress Notes (Signed)
Pt awake and alert on 2LNC, denies any pain or nausea. Aldrete score of 10. Dr Louis Meckel in at bedside and plan is for patient to have nephrostomy tube placement in IR. Bedside SBAR given to [employer] RN in [employer].

## 2015-10-06 NOTE — Anesthesia Preprocedure Evaluation (Signed)
Anesthesia Evaluation  Patient identified by MRN, date of birth, ID band Patient awake    Reviewed: Allergy & Precautions, NPO status , Patient's Chart, lab work & pertinent test results  History of Anesthesia Complications (+) PONV  Airway Mallampati: II  TM Distance: >3 FB Neck ROM: Full    Dental   Pulmonary former smoker,    breath sounds clear to auscultation       Cardiovascular negative cardio ROS   Rhythm:Regular Rate:Normal     Neuro/Psych  Headaches,    GI/Hepatic negative GI ROS, Neg liver ROS,   Endo/Other  Hypothyroidism   Renal/GU negative Renal ROS     Musculoskeletal  (+) Arthritis ,   Abdominal   Peds  Hematology   Anesthesia Other Findings   Reproductive/Obstetrics                             Anesthesia Physical Anesthesia Plan  ASA: III  Anesthesia Plan: General   Post-op Pain Management:    Induction: Intravenous  Airway Management Planned: LMA  Additional Equipment:   Intra-op Plan:   Post-operative Plan: Extubation in OR  Informed Consent: I have reviewed the patients History and Physical, chart, labs and discussed the procedure including the risks, benefits and alternatives for the proposed anesthesia with the patient or authorized representative who has indicated his/her understanding and acceptance.   Dental advisory given  Plan Discussed with: CRNA and Anesthesiologist  Anesthesia Plan Comments:         Anesthesia Quick Evaluation

## 2015-10-06 NOTE — Transfer of Care (Signed)
Immediate Anesthesia Transfer of Care Note  Patient: Beth Davis  Procedure(s) Performed: Procedure(s): CYSTOSCOPY WITH LEFT  RETROGRADE PYELOGRAM / LEFT SEMI-RIGID URETEROSCOPY/  (Left)  Patient Location: PACU  Anesthesia Type:General  Level of Consciousness: sedated, patient cooperative and responds to stimulation  Airway & Oxygen Therapy: Patient Spontanous Breathing and Patient connected to face mask oxygen  Post-op Assessment: Report given to RN and Post -op Vital signs reviewed and stable  Post vital signs: Reviewed and stable  Last Vitals:  Filed Vitals:   10/06/15 1925  BP: 135/59  Pulse: 80  Temp: 37.1 C  Resp: 16    Complications: No apparent anesthesia complications

## 2015-10-06 NOTE — H&P (Signed)
Reason For Visit Right flank pain   History of Present Illness This 65 year old white female referred by Dr. Arvella Nigh, M.D. for evaluation and management of right flank pain with associated hydronephrosis. The patient is one-week status post pelvic exploration for chronic pain. During the operation she had BSO and cervical stump removal. Difficulty identifying the left ureter secondary to adhesions and bleeding from the ovarian vasculature. The bleeding was controlled with a series of Vicryl ties. At the end of the case the patient underwent cystoscopy and ureteral jets were noted bilaterally. The patient states that her flank pain developed postoperative day #3 or 4. She was seen this morning and an ultrasound identified left hydronephrosis. The patient's pain is severe she has associated nausea. Her pain is predominantly in her left flank and radiates down to her left lower abdomen. The patient has had several bowel movements since the surgery. She denies any hematuria or dysuria. She has not had any fevers or chills.   Past Medical History Problems  1. History of breast cancer (Z85.3) 2. History of hypothyroidism (Z86.39)  Surgical History Problems  1. History of Ovarian Surgery  Current Meds 1. Cytomel 5 MCG Oral Tablet (Liothyronine Sodium);  Therapy: (Recorded:21Feb2017) to Recorded 2. Frovatriptan Succinate 2.5 MG Oral Tablet;  Therapy: (Recorded:21Feb2017) to Recorded 3. Synthroid 75 MCG Oral Tablet (Levothyroxine Sodium);  Therapy: (Recorded:21Feb2017) to Recorded  Allergies Medication  1. Penicillins 2. Dilaudid TABS 3. Erythromycin TABS 4. Reglan TABS  Family History Problems  1. Family history of Death of family member : Mother, Father   Mother at age 84Father at age 78 2. Family history of malignant neoplasm of breast (Z80.3) : Mother  Social History Problems    Denied: History of Alcohol use   Denied: History of Caffeine use   Former smoker [employer])    1/2 for 5 years and quit 64 years ago   Married   Number of children   2 daughters   Retired   Therapist, sports  Review of Systems Genitourinary, constitutional, skin, eye, otolaryngeal, hematologic/lymphatic, cardiovascular, pulmonary, endocrine, musculoskeletal, gastrointestinal, neurological and psychiatric system(s) were reviewed and pertinent findings if present are noted and are otherwise negative.  Gastrointestinal: diarrhea.  Neurological: headache.    Vitals Vital Signs [Data Includes: Last 1 Day]  Recorded: 21Feb2017 03:47PM  Height: 5 ft 3 in Weight: 110 lb  BMI Calculated: 19.49 BSA Calculated: 1.5 Blood Pressure: 130 / 83 Temperature: 98.1 F Heart Rate: 86  Physical Exam Constitutional: Well nourished and well developed . No acute distress.  ENT:. The ears and nose are normal in appearance.  Neck: The appearance of the neck is normal and no neck mass is present.  Pulmonary: No respiratory distress and normal respiratory rhythm and effort.  Cardiovascular: Heart rate and rhythm are normal . No peripheral edema.  Abdomen: The abdomen is flat. Moderate tenderness in the LLQ is present. No right CVA tenderness and left CVA tenderness. Bowel sounds are normal. No hernias are palpable.  Lymphatics: The femoral and inguinal nodes are not enlarged or tender.  Skin: Normal skin turgor, no visible rash and no visible skin lesions.  Neuro/Psych:. Mood and affect are appropriate.    Results/Data Urine [Data Includes: Last 1 Day]   21Feb2017  COLOR YELLOW   APPEARANCE CLEAR   SPECIFIC GRAVITY 1.025   pH 6.0   GLUCOSE NEGATIVE   BILIRUBIN NEGATIVE   KETONE 3+   BLOOD TRACE   PROTEIN NEGATIVE   NITRITE NEGATIVE   LEUKOCYTE ESTERASE  NEGATIVE   SQUAMOUS EPITHELIAL/HPF 0-5 HPF  WBC 0-5 WBC/HPF  RBC 3-10 RBC/HPF  BACTERIA NONE SEEN HPF  CRYSTALS NONE SEEN HPF  CASTS NONE SEEN LPF  Yeast NONE SEEN HPF   Patient urinalysis demonstrates microscopic hematuria without evidence of  infection   Assessment Assessed  1. Hydronephrosis of right kidney (N13.30)  Plan Health Maintenance  1. UA With REFLEX; [Do Not Release]; Status:Complete;   [id number] 02:50PM Hydronephrosis of right kidney  2. Administered: Ketorolac Tromethamine 30 MG/ML Injection Solution 3. Follow-up Week x 2 Office  Follow-up  Status: Hold For - Date of Service  Requested for:  [id number] 4. CT-OUTSIDE; Status:Hold For - Records; Requested for:21Feb2017 05:00PM;   Discussion/Summary Although the patient's images are not readily available she has reported left hydronephrosis. She has no flank pain as well and associated nausea and vomiting. I suspect based on the operative report that the left ureter was tied as part of an attempt to gain control of the ovarian vasculature and now the patient has an obstructed left ureter. Given that she is moving her bowels and has no evidence of ileus I suspect she does not have a urine leak or urinoma. However, I do think that obtaining a CT scan with delayed images prior to surgical intervention would be helpful. As such, I ordered a CT scan, hematuria protocol, stat. I'll plan to add the patient onto the operating room schedule urgently this evening for cystoscopy, bilateral retrograde pyelograms, and left ureteral stent placement if amenable. I discussed this procedure with the patient quite detail. We talked about an alternative plan which would include a ureterotomy at the site of the obstruction and attempt to open it up enough to place a stent. We discussed the side effects of long-term indwelling stents. I suspect she'll need her stent for at least 4 weeks. Having gone through the discussion and answered all her questions the patient has agreed to proceed.

## 2015-10-07 ENCOUNTER — Other Ambulatory Visit: Payer: Self-pay | Admitting: Urology

## 2015-10-07 ENCOUNTER — Encounter (HOSPITAL_COMMUNITY): Payer: Self-pay | Admitting: Urology

## 2015-10-07 DIAGNOSIS — N131 Hydronephrosis with ureteral stricture, not elsewhere classified: Secondary | ICD-10-CM | POA: Diagnosis not present

## 2015-10-07 DIAGNOSIS — N1330 Unspecified hydronephrosis: Secondary | ICD-10-CM

## 2015-10-07 LAB — BASIC METABOLIC PANEL
ANION GAP: 8 (ref 5–15)
BUN: 11 mg/dL (ref 6–20)
CO2: 25 mmol/L (ref 22–32)
Calcium: 8.5 mg/dL — ABNORMAL LOW (ref 8.9–10.3)
Chloride: 105 mmol/L (ref 101–111)
Creatinine, Ser: 0.88 mg/dL (ref 0.44–1.00)
Glucose, Bld: 117 mg/dL — ABNORMAL HIGH (ref 65–99)
POTASSIUM: 3.5 mmol/L (ref 3.5–5.1)
SODIUM: 138 mmol/L (ref 135–145)

## 2015-10-07 LAB — CBC
HEMATOCRIT: 33.7 % — AB (ref 36.0–46.0)
HEMOGLOBIN: 12.4 g/dL (ref 12.0–15.0)
MCH: 34 pg (ref 26.0–34.0)
MCHC: 36.8 g/dL — ABNORMAL HIGH (ref 30.0–36.0)
MCV: 92.3 fL (ref 78.0–100.0)
Platelets: 158 10*3/uL (ref 150–400)
RBC: 3.65 MIL/uL — ABNORMAL LOW (ref 3.87–5.11)
RDW: 13.1 % (ref 11.5–15.5)
WBC: 4.3 10*3/uL (ref 4.0–10.5)

## 2015-10-07 MED ORDER — CIPROFLOXACIN HCL 500 MG PO TABS
500.0000 mg | ORAL_TABLET | Freq: Once | ORAL | Status: AC
  Administered 2015-10-07: 500 mg via ORAL
  Filled 2015-10-07: qty 1

## 2015-10-07 MED ORDER — LACTATED RINGERS IV SOLN
INTRAVENOUS | Status: DC
  Administered 2015-10-07: 01:00:00 via INTRAVENOUS

## 2015-10-07 MED ORDER — HYDROMORPHONE HCL 1 MG/ML IJ SOLN: 0.5000 mg | INTRAMUSCULAR | Status: DC | PRN

## 2015-10-07 MED ORDER — OXYCODONE-ACETAMINOPHEN 7.5-325 MG PO TABS: 1.0000 | ORAL_TABLET | ORAL | Status: DC | PRN

## 2015-10-07 MED ORDER — OXYCODONE-ACETAMINOPHEN 5-325 MG PO TABS: 1.0000 | ORAL_TABLET | ORAL | Status: DC | PRN

## 2015-10-07 MED ORDER — OXYCODONE HCL 5 MG PO TABS: 2.5000 mg | ORAL_TABLET | ORAL | Status: DC | PRN

## 2015-10-07 MED ORDER — ACETAMINOPHEN 10 MG/ML IV SOLN
1000.0000 mg | Freq: Four times a day (QID) | INTRAVENOUS | Status: DC
  Administered 2015-10-07 (×2): 1000 mg via INTRAVENOUS
  Filled 2015-10-07 (×4): qty 100

## 2015-10-07 MED ORDER — ONDANSETRON HCL 4 MG/2ML IJ SOLN: 4.0000 mg | Freq: Four times a day (QID) | INTRAMUSCULAR | Status: DC | PRN

## 2015-10-07 NOTE — Progress Notes (Signed)
Dr Louis Meckel aware pt has voided 167ml since foley d/c.  He states this is adequate for pt d/c home.

## 2015-10-07 NOTE — Discharge Summary (Signed)
Date of admission: 10/06/2015  Date of discharge: 10/08/2015  Admission diagnosis: left ureteral obstruction  Discharge diagnosis: same, s/p attempted left ureteral stent, s/p left nephrostomy tube placement.  Secondary diagnoses:  Patient Active Problem List   Diagnosis Date Noted  . Hydronephrosis of left kidney 10/06/2015  . S/P bilateral salpingo-oophorectomy 09/28/2015  . Family history of breast cancer   . Family history of melanoma   . Genetic testing 03/06/2015  . Osteopenia 06/07/2012  . Breast cancer, left breast (HCC) 06/01/2011    History and Physical: For full details, please see admission history and physical. Briefly, Beth Davis is a 64 y.o. year old patient with left ureteral obstruction secondary to iatrogenic distal ureteral injury.   Hospital Course: Patient tolerated the procedure well.  Unfortunately I was unable to pass a wire and thus place a stent in the left ureter. She subsequently went to interventional radiology and had a nephrostomy tube placed.  She was then transferred to the floor after an uneventful PACU stay.  Her hospital course was uncomplicated.  On POD#1  she had met discharge criteria: was eating a regular diet, was up and ambulating independently,  pain was well controlled, was voiding without a catheter, and was ready to for discharge.   Laboratory values:   Recent Labs  10/07/15 0112  WBC 4.3  HGB 12.4  HCT 33.7*    Recent Labs  10/07/15 0112  NA 138  K 3.5  CL 105  CO2 25  GLUCOSE 117*  BUN 11  CREATININE 0.88  CALCIUM 8.5*   No results for input(s): LABPT, INR in the last 72 hours. No results for input(s): LABURIN in the last 72 hours. No results found for this or any previous visit.  Disposition: Home  Discharge instruction: The patient was instructed to be ambulatory but told to refrain from heavy lifting, strenuous activity, or driving.   Discharge medications:    Medication List    STOP taking these  medications        aspirin EC 325 MG tablet      TAKE these medications        calcium citrate-vitamin D 315-200 MG-UNIT tablet  Commonly known as:  CITRACAL+D  Take 1 tablet by mouth 4 (four) times daily.     cholecalciferol 1000 units tablet  Commonly known as:  VITAMIN D  Take 1,000 Units by mouth daily.     frovatriptan 2.5 MG tablet  Commonly known as:  FROVA  Take 2.5 mg by mouth as needed for migraine. If recurs, may repeat after 2 hours. Max of 3 tabs in 24 hours.     levothyroxine 75 MCG tablet  Commonly known as:  SYNTHROID, LEVOTHROID  Take 75 mcg by mouth daily before breakfast.     oxyCODONE-acetaminophen 7.5-325 MG tablet  Commonly known as:  PERCOCET  Take 1 tablet by mouth every 4 (four) hours as needed for severe pain.        Followup:  Follow-up Information    Follow up with HERRICK, BENJAMIN W, MD In 1 month.   Specialty:  Urology   Contact information:   [address] [place] [address] [phone]         

## 2015-10-07 NOTE — Progress Notes (Signed)
Patient is status post left percutaneous  nephrostomy tube.   Patient had an uneventful PACU stay and was transferred to the floor. Her night was quiet. Her pain is improved significantly after her procedure.  Patient has no complaints this morning. Minimal pain. No nausea/vomiting.   Filed Vitals:   10/06/15 2345 10/07/15 0004 10/07/15 0150 10/07/15 0443  BP: 128/75 129/68 122/63 117/60  Pulse: 75 73 84 82  Temp:  98.3 F (36.8 C) 98.2 F (36.8 C) 98.5 F (36.9 C)  TempSrc:   Oral Oral  Resp: 11 12 12 12   Height:  5\' 3"  (1.6 m)    Weight:  49.896 kg (110 lb)    SpO2: 99% 100% 99% 96%    Intake/Output Summary (Last 24 hours) at 10/07/15 0935 Last data filed at 10/07/15 0830  Gross per 24 hour  Intake   1240 ml  Output   2330 ml  Net  -1090 ml   NAD Nonlabored breathing Straw colored urine from nephrostomy tube  Insertion site is clean/dry/intact    Recent Labs  10/07/15 0112  WBC 4.3  HGB 12.4  HCT 33.7*    Recent Labs  10/07/15 0112  NA 138  K 3.5  CL 105  CO2 25  GLUCOSE 117*  BUN 11  CREATININE 0.88  CALCIUM 8.5*   No results for input(s): LABPT, INR in the last 72 hours. No results for input(s): PSA in the last 72 hours. No results for input(s): LABURIN in the last 72 hours. No results found for this or any previous visit.   Imp: Status post left nephrostomy tube placement for high-grade left ureteral obstruction.    Plan: The plan is for the patient to be discharged home today after removing the Foley catheter and the patient passing a voiding trial. She will be scheduled for follow-up with interventional radiology in which they will attempt to pass the occluded ureter in antegrade fashion. I will plan to follow up with the patient after her interventional radiology procedure. I suspect they will get her scheduled over the next 10-14 days.

## 2015-10-07 NOTE — Discharge Instructions (Signed)
Discharge instructions following urology surgery  Call your doctor for: Fevers greater than 100.5 Severe nausea or vomiting Increasing pain not controlled by pain medication Increasing redness or drainage from incisions Decreased urine output or a catheter is no longer draining  The number for questions is [phone].  Activity: Gradually increase activity with short frequent walks, 3-4 times a day.  Avoid strenuous activities, like sports, lawn-mowing, or heavy lifting (more than 10-15 pounds).  Wear loose, comfortable clothing that pull or kink the tube or tubes.  Do not drive while taking pain medication, or until your doctor permitts it.  Bathing and dressing changes: You should not shower for 48 hours after surgery.  Do not soak your back in a bathtub.   Diet: It is extremely important to drink plenty of fluids after surgery, especially water.  You may resume your regular diet, unless otherwise instructed.  Medications: May take Tylenol (acetaminophen) or ibuprofen (Advil, Motrin) as directed over-the-counter. Take any prescriptions as directed.  Follow-up appointments: Follow-up appointment will be scheduled with Dr. Louis Meckel once you have had attempts by interventional radiology to place a stent in the left ureter. He will be contacted with the follow-up appointment.   Percutaneous Nephrostomy, Care After Refer to this sheet in the next few weeks. These instructions provide you with information on caring for yourself after your procedure. Your health care provider may also give you more specific instructions. Your treatment has been planned according to current medical practices, but problems sometimes occur. Call your health care provider if you have any problems or questions after your procedure. WHAT TO EXPECT AFTER THE PROCEDURE You will need to remain lying down for several hours. HOME CARE INSTRUCTIONS  Your nephrostomy tube is connected to a leg bag or bedside  drainage bag. Always keep the tubing, the leg bag, or the bedside drainage bags below the level of the kidney so that the urine drains freely.  During the day, if you are connecting the nephrostomy tube to a leg bag, be sure there are no kinks in the tubing and that the urine is draining freely.  At night, you may want to connect the nephrostomy tube or the leg bag to a larger bedside drainage bag.  Change the dressing as often as directed by your health care provider, or if it becomes wet.  Gently remove the tapes and dressing from around the nephrostomy tube. Be careful not to pull on the tube while removing the dressing.  Wash the skin around the tube, rinse well, and dry.  Place two split drain sponges in and around the tube exit site.  Place tape around edge of the dressing.  Secure the nephrostomy tubing. Remember to make certain that the nephrostomy tube does not kink or become pinched closed. It can be useful to wrap any exposed tubing going from the nephrostomy tube to any of the connecting tubes to either the leg bag or drainage bag with an elastic bandage.  Every three weeks, replace the leg bag, drainage bag, and any extension tubing connected to your nephrostomy tube. Your health care provider will explain how to change the drainage bag and extension tubing. SEEK MEDICAL CARE IF:  You experience any problems with any of the valves or tubing.  You have persistent pain or soreness in your back.  You have a fever or chills. SEEK IMMEDIATE MEDICAL CARE IF:  You have abdominal pain during the first week.  You have a new appearance of blood in your urine.  You have back pain that is not relieved by your medicine.  You have drainage, redness, swelling, or pain at the tube insertion site.  You have decreased urine output.  Your nephrostomy tube comes out.   This information is not intended to replace advice given to you by your health care provider. Make sure you discuss  any questions you have with your health care provider.   Document Released: 03/24/2004 Document Revised: 08/22/2014 Document Reviewed: 03/28/2013 Elsevier Interactive Patient Education [employer].

## 2015-10-07 NOTE — Progress Notes (Signed)
SBAR given to [employer] RN on [facility]. Pt A&O X3, denies any pain or nausea. VSS.

## 2015-10-07 NOTE — Progress Notes (Signed)
IR follow up of (L)PCN placed last PM for hydronephrosis due to obstructed (L)ureter. Pt feeling well. Plan for discharge today.  BP 117/60 mmHg  Pulse 82  Temp(Src) 98.5 F (36.9 C) (Oral)  Resp 12  Ht 5\' 3"  (1.6 m)  Wt 110 lb (49.896 kg)  BMI 19.49 kg/m2  SpO2 96% (L)PCN intact, site clean. UOP slight blood tinge, but mostly clear  S/p (L)PCN Will schedule attempt and antegrade stent placement in a couple weeks. Reviewed and gave pt instructions on care of PCN.  Ascencion Dike PA-C Interventional Radiology 10/07/2015 10:06 AM

## 2015-10-07 NOTE — Progress Notes (Signed)
D/C instructions reviewed w/ pt. Pt verbalizes understanding and all questions answered. Pt d/c in stable condition to sister-in-law's car in w/c by NT. Pt in possession of d/c instructions, extra nephrostomy bag, and all personal belongings.

## 2015-10-20 ENCOUNTER — Other Ambulatory Visit: Payer: Self-pay | Admitting: Radiology

## 2015-10-21 ENCOUNTER — Other Ambulatory Visit: Payer: Self-pay | Admitting: Radiology

## 2015-10-22 ENCOUNTER — Encounter (HOSPITAL_COMMUNITY): Payer: Self-pay

## 2015-10-22 ENCOUNTER — Other Ambulatory Visit: Payer: Self-pay | Admitting: Interventional Radiology

## 2015-10-22 ENCOUNTER — Ambulatory Visit (HOSPITAL_COMMUNITY)
Admission: RE | Admit: 2015-10-22 | Discharge: 2015-10-22 | Disposition: A | Discharge status: Home / Self Care | Payer: BLUE CROSS/BLUE SHIELD | Source: Ambulatory Visit | Attending: Urology | Admitting: Urology

## 2015-10-22 ENCOUNTER — Other Ambulatory Visit: Payer: Self-pay | Admitting: Urology

## 2015-10-22 DIAGNOSIS — Z808 Family history of malignant neoplasm of other organs or systems: Secondary | ICD-10-CM | POA: Diagnosis not present

## 2015-10-22 DIAGNOSIS — Z888 Allergy status to other drugs, medicaments and biological substances status: Secondary | ICD-10-CM | POA: Diagnosis not present

## 2015-10-22 DIAGNOSIS — Z79899 Other long term (current) drug therapy: Secondary | ICD-10-CM | POA: Diagnosis not present

## 2015-10-22 DIAGNOSIS — Z87891 Personal history of nicotine dependence: Secondary | ICD-10-CM | POA: Insufficient documentation

## 2015-10-22 DIAGNOSIS — Z436 Encounter for attention to other artificial openings of urinary tract: Secondary | ICD-10-CM | POA: Diagnosis not present

## 2015-10-22 DIAGNOSIS — Z803 Family history of malignant neoplasm of breast: Secondary | ICD-10-CM | POA: Insufficient documentation

## 2015-10-22 DIAGNOSIS — Z853 Personal history of malignant neoplasm of breast: Secondary | ICD-10-CM | POA: Insufficient documentation

## 2015-10-22 DIAGNOSIS — Z885 Allergy status to narcotic agent status: Secondary | ICD-10-CM | POA: Diagnosis not present

## 2015-10-22 DIAGNOSIS — E039 Hypothyroidism, unspecified: Secondary | ICD-10-CM | POA: Diagnosis not present

## 2015-10-22 DIAGNOSIS — Z881 Allergy status to other antibiotic agents status: Secondary | ICD-10-CM | POA: Diagnosis not present

## 2015-10-22 DIAGNOSIS — M1990 Unspecified osteoarthritis, unspecified site: Secondary | ICD-10-CM | POA: Diagnosis not present

## 2015-10-22 DIAGNOSIS — Z8249 Family history of ischemic heart disease and other diseases of the circulatory system: Secondary | ICD-10-CM | POA: Insufficient documentation

## 2015-10-22 DIAGNOSIS — N135 Crossing vessel and stricture of ureter without hydronephrosis: Secondary | ICD-10-CM

## 2015-10-22 DIAGNOSIS — N1330 Unspecified hydronephrosis: Secondary | ICD-10-CM

## 2015-10-22 DIAGNOSIS — N131 Hydronephrosis with ureteral stricture, not elsewhere classified: Secondary | ICD-10-CM | POA: Insufficient documentation

## 2015-10-22 DIAGNOSIS — Z880 Allergy status to penicillin: Secondary | ICD-10-CM | POA: Insufficient documentation

## 2015-10-22 LAB — CBC WITH DIFFERENTIAL/PLATELET
Basophils Absolute: 0 K/uL (ref 0.0–0.1)
Basophils Relative: 1 %
Eosinophils Absolute: 0.2 K/uL (ref 0.0–0.7)
Eosinophils Relative: 6 %
HCT: 37.5 % (ref 36.0–46.0)
Hemoglobin: 12 g/dL (ref 12.0–15.0)
Lymphocytes Relative: 28 %
Lymphs Abs: 1.1 K/uL (ref 0.7–4.0)
MCH: 30.6 pg (ref 26.0–34.0)
MCHC: 32 g/dL (ref 30.0–36.0)
MCV: 95.7 fL (ref 78.0–100.0)
Monocytes Absolute: 0.3 K/uL (ref 0.1–1.0)
Monocytes Relative: 7 %
Neutro Abs: 2.2 K/uL (ref 1.7–7.7)
Neutrophils Relative %: 58 %
Platelets: 284 K/uL (ref 150–400)
RBC: 3.92 MIL/uL (ref 3.87–5.11)
RDW: 13.3 % (ref 11.5–15.5)
WBC: 3.7 K/uL — ABNORMAL LOW (ref 4.0–10.5)

## 2015-10-22 LAB — BASIC METABOLIC PANEL
ANION GAP: 9 (ref 5–15)
BUN: 12 mg/dL (ref 6–20)
CHLORIDE: 109 mmol/L (ref 101–111)
CO2: 26 mmol/L (ref 22–32)
Calcium: 9.6 mg/dL (ref 8.9–10.3)
Creatinine, Ser: 0.53 mg/dL (ref 0.44–1.00)
GFR calc non Af Amer: >60 mL/min (ref >60)
GLUCOSE: 103 mg/dL — AB (ref 65–99)
Potassium: 4.2 mmol/L (ref 3.5–5.1)
Sodium: 144 mmol/L (ref 135–145)

## 2015-10-22 LAB — PROTIME-INR
INR: 1.14 (ref 0.00–1.49)
Prothrombin Time: 14.3 seconds (ref 11.6–15.2)

## 2015-10-22 MED ORDER — MIDAZOLAM HCL 2 MG/2ML IJ SOLN
INTRAMUSCULAR | Status: AC
  Filled 2015-10-22: qty 4

## 2015-10-22 MED ORDER — LIDOCAINE HCL 1 % IJ SOLN
INTRAMUSCULAR | Status: AC
  Filled 2015-10-22: qty 20

## 2015-10-22 MED ORDER — IOHEXOL 300 MG/ML  SOLN
50.0000 mL | Freq: Once | INTRAMUSCULAR | Status: AC | PRN
  Administered 2015-10-22: 20 mL via INTRAVENOUS

## 2015-10-22 MED ORDER — DIPHENHYDRAMINE HCL 25 MG PO CAPS
25.0000 mg | ORAL_CAPSULE | ORAL | Status: DC | PRN
  Administered 2015-10-22: 25 mg via ORAL
  Filled 2015-10-22: qty 1

## 2015-10-22 MED ORDER — VANCOMYCIN HCL IN DEXTROSE 1-5 GM/200ML-% IV SOLN
1000.0000 mg | INTRAVENOUS | Status: AC
  Administered 2015-10-22: 1000 mg via INTRAVENOUS
  Filled 2015-10-22: qty 200

## 2015-10-22 MED ORDER — HYDROCODONE-ACETAMINOPHEN 5-325 MG PO TABS: 1.0000 | ORAL_TABLET | ORAL | Status: DC | PRN

## 2015-10-22 MED ORDER — MIDAZOLAM HCL 2 MG/2ML IJ SOLN
INTRAMUSCULAR | Status: AC | PRN
  Administered 2015-10-22: 1 mg via INTRAVENOUS
  Administered 2015-10-22 (×4): 0.5 mg via INTRAVENOUS

## 2015-10-22 MED ORDER — SODIUM CHLORIDE 0.9 % IV SOLN
INTRAVENOUS | Status: DC
  Administered 2015-10-22: 08:00:00 via INTRAVENOUS

## 2015-10-22 MED ORDER — FENTANYL CITRATE (PF) 100 MCG/2ML IJ SOLN
INTRAMUSCULAR | Status: AC | PRN
  Administered 2015-10-22 (×2): 25 ug via INTRAVENOUS
  Administered 2015-10-22: 50 ug via INTRAVENOUS

## 2015-10-22 MED ORDER — FENTANYL CITRATE (PF) 100 MCG/2ML IJ SOLN
INTRAMUSCULAR | Status: AC
  Filled 2015-10-22: qty 2

## 2015-10-22 NOTE — Sedation Documentation (Signed)
Patient denies pain and is resting comfortably.  

## 2015-10-22 NOTE — Procedures (Signed)
L Perc Neph change Attempted ureteral stent placement No complication No blood loss. See complete dictation in [facility].

## 2015-10-22 NOTE — Discharge Instructions (Signed)
Moderate Conscious Sedation, Adult, Care After °Refer to this sheet in the next few weeks. These instructions provide you with information on caring for yourself after your procedure. Your health care provider may also give you more specific instructions. Your treatment has been planned according to current medical practices, but problems sometimes occur. Call your health care provider if you have any problems or questions after your procedure. °WHAT TO EXPECT AFTER THE PROCEDURE  °After your procedure: °· You may feel sleepy, clumsy, and have poor balance for several hours. °· Vomiting may occur if you eat too soon after the procedure. °HOME CARE INSTRUCTIONS °· Do not participate in any activities where you could become injured for at least 24 hours. Do not: °¨ Drive. °¨ Swim. °¨ Ride a bicycle. °¨ Operate heavy machinery. °¨ Cook. °¨ Use power tools. °¨ Climb ladders. °¨ Work from a high place. °· Do not make important decisions or sign legal documents until you are improved. °· If you vomit, drink water, juice, or soup when you can drink without vomiting. Make sure you have little or no nausea before eating solid foods. °· Only take over-the-counter or prescription medicines for pain, discomfort, or fever as directed by your health care provider. °· Make sure you and your family fully understand everything about the medicines given to you, including what side effects may occur. °· You should not drink alcohol, take sleeping pills, or take medicines that cause drowsiness for at least 24 hours. °· If you smoke, do not smoke without supervision. °· If you are feeling better, you may resume normal activities 24 hours after you were sedated. °· Keep all appointments with your health care provider. °SEEK MEDICAL CARE IF: °· Your skin is pale or bluish in color. °· You continue to feel nauseous or vomit. °· Your pain is getting worse and is not helped by medicine. °· You have bleeding or swelling. °· You are still  sleepy or feeling clumsy after 24 hours. °SEEK IMMEDIATE MEDICAL CARE IF: °· You develop a rash. °· You have difficulty breathing. °· You develop any type of allergic problem. °· You have a fever. °MAKE SURE YOU: °· Understand these instructions. °· Will watch your condition. °· Will get help right away if you are not doing well or get worse. °  °This information is not intended to replace advice given to you by your health care provider. Make sure you discuss any questions you have with your health care provider. °  °Document Released: 05/22/2013 Document Revised: 08/22/2014 Document Reviewed: 05/22/2013 °Elsevier Interactive Patient Education ©2016 Elsevier Inc[employer] ° °Percutaneous Nephrostomy, Care After °Refer to this sheet in the next few weeks. These instructions provide you with information on caring for yourself after your procedure. Your health care provider may also give you more specific instructions. Your treatment has been planned according to current medical practices, but problems sometimes occur. Call your health care provider if you have any problems or questions after your procedure. °WHAT TO EXPECT AFTER THE PROCEDURE °You will need to remain lying down for several hours. °HOME CARE INSTRUCTIONS °· Your nephrostomy tube is connected to a leg bag or bedside drainage bag. Always keep the tubing, the leg bag, or the bedside drainage bags below the level of the kidney so that the urine drains freely. °· During the day, if you are connecting the nephrostomy tube to a leg bag, be sure there are no kinks in the tubing and that the urine is draining freely. °·   At night, you may want to connect the nephrostomy tube or the leg bag to a larger bedside drainage bag. °· Change the dressing as often as directed by your health care provider, or if it becomes wet. °¨ Gently remove the tapes and dressing from around the nephrostomy tube. Be careful not to pull on the tube while removing the dressing. °¨ Wash the skin  around the tube, rinse well, and dry. °¨ Place two split drain sponges in and around the tube exit site. °¨ Place tape around edge of the dressing. °¨ Secure the nephrostomy tubing. Remember to make certain that the nephrostomy tube does not kink or become pinched closed. It can be useful to wrap any exposed tubing going from the nephrostomy tube to any of the connecting tubes to either the leg bag or drainage bag with an elastic bandage. °· Every three weeks, replace the leg bag, drainage bag, and any extension tubing connected to your nephrostomy tube. Your health care provider will explain how to change the drainage bag and extension tubing. °SEEK MEDICAL CARE IF: °· You experience any problems with any of the valves or tubing. °· You have persistent pain or soreness in your back. °· You have a fever or chills. °SEEK IMMEDIATE MEDICAL CARE IF: °· You have abdominal pain during the first week. °· You have a new appearance of blood in your urine. °· You have back pain that is not relieved by your medicine. °· You have drainage, redness, swelling, or pain at the tube insertion site. °· You have decreased urine output. °· Your nephrostomy tube comes out. °  °This information is not intended to replace advice given to you by your health care provider. Make sure you discuss any questions you have with your health care provider. °  °Document Released: 03/24/2004 Document Revised: 08/22/2014 Document Reviewed: 03/28/2013 °Elsevier Interactive Patient Education ©2016 Elsevier Inc. ° °

## 2015-10-22 NOTE — Progress Notes (Signed)
Returned from [employer] w head itching and red. Call for benadryl order

## 2015-10-22 NOTE — H&P (Signed)
Chief Complaint: left ureteral obstruction  Referring Physician:Dr. Louis Meckel  Supervising Physician: Arne Cleveland  HPI: Beth Davis is an 65 y.o. female who is known to [place] for recent placement of a PCN drain.  She had a GYN procedure and developed a ureteral obstruction postop.  She has this drain placed and has done well with this.  She presents today for attempted placement of a JJ stent to help open up the stricture and to be able to remove her drain in several days.  Past Medical History:  Past Medical History  Diagnosis Date  . Hypothyroidism   . Osteoporosis   . Wears glasses   . Family history of breast cancer   . Family history of melanoma   . Breast cancer, IDC, Left, ER +, PR -, Her2 -. dx 06/01/2011--- oncologist-  dr Jana Hakim-- per last note no recurrence    Stage IA (T1b, N0), grade 2---- s/p  left total mastectomy w/ node disssection & right prophylactic total mastectomy 06-14-2011  and chemotherapy complete 10-03-2011 (anti-estrogen therapry ended 09/ 2014)  . Pelvic adhesions   . Pelvic pain in female   . Migraine   . History of colitis     COLLAGENOUS  . PONV (postoperative nausea and vomiting)   . Arthritis     Past Surgical History:  Past Surgical History  Procedure Laterality Date  . Orif femur fracture  1969    and Humerus decompression  . Colonoscopy  2012  . Portacath placement  07/26/2011    Procedure: INSERTION PORT-A-CATH;  Surgeon: Haywood Lasso, MD;  Location: [place];  Service: General;  Laterality: N/A;  port a cath placement  . Abdominal hysterectomy  2006  . Port-a-cath removal  10/24/2011    Procedure: MINOR REMOVAL PORT-A-CATH;  Surgeon: Haywood Lasso, MD;  Location: [place];  Service: General;  Laterality: Right;  removal of portacath  . Mass excision  03/12/2012    Procedure: MINOR EXCISION OF MASS;  Surgeon: Haywood Lasso, MD;  Location: [place];   Service: General;  Laterality: Right;  removal part of port-a-cath scar  . Carpal tunnel release Right 02/05/2013    Procedure: CARPAL TUNNEL RELEASE;  Surgeon: Cammie Sickle., MD;  Location: [place];  Service: Orthopedics;  Laterality: Right;  . Laparoscopic ovarian cystectomy  2003  . Breast surgery  2004    excision left breast fibroadenoma  . Left total mastectomy with lymph node dissection/  right prophylactic mastectomy  06-14-2011  . Negative sleep study  2016  per pt  . Laparoscopic bilateral salpingo oopherectomy Bilateral 09/28/2015    Procedure:  ATTEMPTED LAPAROSCOPIC CONVERTED TO OPEN LAPAROSCOPY WITH BILATERAL SALPINGO OOPHORECTOMY AND  CERVICAL STUMP;  Surgeon: Arvella Nigh, MD;  Location: [facility];  Service: Gynecology;  Laterality: Bilateral;  . Cystoscopy Bilateral 09/28/2015    Procedure: CYSTOSCOPY;  Surgeon: Arvella Nigh, MD;  Location: [facility];  Service: Gynecology;  Laterality: Bilateral;  . Cystoscopy w/ ureteral stent placement Bilateral 10/06/2015    Procedure: CYSTOSCOPY WITH BILATERAL  RETROGRADE PYELOGRAM / LEFT SEMI-RIGID URETEROSCOPY/ LEFT URETERAL EXPLORATION;  Surgeon: Ardis Hughs, MD;  Location: [facility];  Service: Urology;  Laterality: Bilateral;    Family History:  Family History  Problem Relation Age of Onset  . Breast cancer Mother 39  . Dementia Mother   . Breast cancer Cousin 83    paternal cousin with bilateral  breast cancer  . Heart attack Father 45  . Melanoma Father 58  . Melanoma Brother 84  . Heart attack Maternal Aunt 38  . Parkinson's disease Paternal Aunt   . Heart attack Maternal Grandmother 76  . COPD Maternal Grandfather 28  . Hypertension Paternal Grandmother   . Stroke Paternal Grandmother   . Dementia Maternal Aunt   . Ovarian cancer Other     MGM's mother    Social History:  reports that she quit smoking about 40 years ago. Her smoking use included Cigarettes. She  has a 3 pack-year smoking history. She has never used smokeless tobacco. She reports that she does not drink alcohol or use illicit drugs.  Allergies:  Allergies  Allergen Reactions  . Dilaudid [Hydromorphone Hcl] Nausea And Vomiting  . Erythromycin Nausea And Vomiting  . Penicillins Hives    Has patient had a PCN reaction causing immediate rash, facial/tongue/throat swelling, SOB or lightheadedness with hypotension: NO Has patient had a PCN reaction causing severe rash involving mucus membranes or skin necrosis: NO Has patient had a PCN reaction that required hospitalization NO Has patient had a PCN reaction occurring within the last 10 years: NO If all of the above answers are "NO", then may proceed with Cephalosporin use.  . Reglan [Metoclopramide Hcl] Diarrhea    Medications:   Medication List    ASK your doctor about these medications        calcium citrate-vitamin D 315-200 MG-UNIT tablet  Commonly known as:  CITRACAL+D  Take 1 tablet by mouth 4 (four) times daily.     cholecalciferol 1000 units tablet  Commonly known as:  VITAMIN D  Take 1,000 Units by mouth daily.     frovatriptan 2.5 MG tablet  Commonly known as:  FROVA  Take 2.5 mg by mouth as needed for migraine. If recurs, may repeat after 2 hours. Max of 3 tabs in 24 hours.     levothyroxine 75 MCG tablet  Commonly known as:  SYNTHROID, LEVOTHROID  Take 75 mcg by mouth daily before breakfast.     oxyCODONE-acetaminophen 7.5-325 MG tablet  Commonly known as:  PERCOCET  Take 1 tablet by mouth every 4 (four) hours as needed for severe pain.        Please HPI for pertinent positives, otherwise complete 10 system ROS negative.  Mallampati Score: MD Evaluation Airway: WNL Heart: WNL Abdomen: WNL Chest/ Lungs: WNL ASA  Classification: 2 Mallampati/Airway Score: One  Physical Exam: There were no vitals taken for this visit. There is no weight on file to calculate BMI. General: pleasant, WD, WN white  female who is laying in bed in NAD HEENT: head is normocephalic, atraumatic.  Sclera are noninjected.  PERRL.  Ears and nose without any masses or lesions.  Mouth is pink and moist Heart: regular, rate, and rhythm.  Normal s1,s2. No obvious murmurs, gallops, or rubs noted.  Palpable radial and pedal pulses bilaterally Lungs: CTAB, no wheezes, rhonchi, or rales noted.  Respiratory effort nonlabored Abd: soft, NT, ND, +BS, no masses, hernias, or organomegaly.  Left CVA with PCN drain in place working well with clear yellow urine present.  Site is clean and covered Psych: A&Ox3 with an appropriate affect.   Labs: Results for orders placed or performed during the hospital encounter of 10/22/15 (from the past 48 hour(s))  Basic metabolic panel     Status: Abnormal   Collection Time: 10/22/15  8:00 AM  Result Value Ref Range   Sodium 144  135 - 145 mmol/L   Potassium 4.2 3.5 - 5.1 mmol/L   Chloride 109 101 - 111 mmol/L   CO2 26 22 - 32 mmol/L   Glucose, Bld 103 (H) 65 - 99 mg/dL   BUN 12 6 - 20 mg/dL   Creatinine, Ser 0.53 0.44 - 1.00 mg/dL   Calcium 9.6 8.9 - 10.3 mg/dL   GFR calc non Af Amer >60 >60 mL/min   GFR calc Af Amer >60 >60 mL/min    Comment: (NOTE) The eGFR has been calculated using the CKD EPI equation. This calculation has not been validated in all clinical situations. eGFR's persistently <60 mL/min signify possible Chronic Kidney Disease.    Anion gap 9 5 - 15  CBC with Differential/Platelet     Status: Abnormal   Collection Time: 10/22/15  8:00 AM  Result Value Ref Range   WBC 3.7 (L) 4.0 - 10.5 K/uL   RBC 3.92 3.87 - 5.11 MIL/uL   Hemoglobin 12.0 12.0 - 15.0 g/dL   HCT 37.5 36.0 - 46.0 %   MCV 95.7 78.0 - 100.0 fL   MCH 30.6 26.0 - 34.0 pg   MCHC 32.0 30.0 - 36.0 g/dL   RDW 13.3 11.5 - 15.5 %   Platelets 284 150 - 400 K/uL   Neutrophils Relative % 58 %   Neutro Abs 2.2 1.7 - 7.7 K/uL   Lymphocytes Relative 28 %   Lymphs Abs 1.1 0.7 - 4.0 K/uL   Monocytes  Relative 7 %   Monocytes Absolute 0.3 0.1 - 1.0 K/uL   Eosinophils Relative 6 %   Eosinophils Absolute 0.2 0.0 - 0.7 K/uL   Basophils Relative 1 %   Basophils Absolute 0.0 0.0 - 0.1 K/uL  Protime-INR     Status: None   Collection Time: 10/22/15  8:00 AM  Result Value Ref Range   Prothrombin Time 14.3 11.6 - 15.2 seconds   INR 1.14 0.00 - 1.49    Imaging: No results found.  Assessment/Plan 1. Left ureteral obstruction, s/p PCN -attempt placement of double J stent today to relieve obstruction in order to be able to remove PCN drain -patient has been NPO and all labs have been reviewed and are normal. -the procedure including risks and complications have been d/w the patient.  She understands and is agreeable to proceed.    Thank you for this interesting consult.  I greatly enjoyed meeting NAHIMA ALES and look forward to participating in their care.  A copy of this report was sent to the requesting provider on this date.  Electronically Signed: Henreitta Cea 10/22/2015, 9:00 AM   I spent a total of    25 Minutes in face to face in clinical consultation, greater than 50% of which was counseling/coordinating care for left ureteral obstruction

## 2015-10-22 NOTE — Progress Notes (Signed)
Redness and itching relieved

## 2015-10-26 ENCOUNTER — Other Ambulatory Visit: Payer: Self-pay | Admitting: Oncology

## 2015-11-18 ENCOUNTER — Other Ambulatory Visit: Payer: Self-pay | Admitting: General Surgery

## 2015-11-18 ENCOUNTER — Other Ambulatory Visit: Payer: Self-pay | Admitting: Radiology

## 2015-11-19 ENCOUNTER — Ambulatory Visit (HOSPITAL_COMMUNITY)
Admission: RE | Admit: 2015-11-19 | Discharge: 2015-11-19 | Disposition: A | Discharge status: Home / Self Care | Payer: BLUE CROSS/BLUE SHIELD | Source: Ambulatory Visit | Attending: Interventional Radiology | Admitting: Interventional Radiology

## 2015-11-19 ENCOUNTER — Other Ambulatory Visit (HOSPITAL_COMMUNITY): Payer: Self-pay | Admitting: Interventional Radiology

## 2015-11-19 ENCOUNTER — Encounter (HOSPITAL_COMMUNITY): Payer: Self-pay

## 2015-11-19 ENCOUNTER — Other Ambulatory Visit: Payer: Self-pay | Admitting: Interventional Radiology

## 2015-11-19 DIAGNOSIS — N131 Hydronephrosis with ureteral stricture, not elsewhere classified: Secondary | ICD-10-CM | POA: Diagnosis not present

## 2015-11-19 DIAGNOSIS — Z90722 Acquired absence of ovaries, bilateral: Secondary | ICD-10-CM | POA: Diagnosis not present

## 2015-11-19 DIAGNOSIS — Z79899 Other long term (current) drug therapy: Secondary | ICD-10-CM | POA: Diagnosis not present

## 2015-11-19 DIAGNOSIS — Z885 Allergy status to narcotic agent status: Secondary | ICD-10-CM | POA: Insufficient documentation

## 2015-11-19 DIAGNOSIS — N135 Crossing vessel and stricture of ureter without hydronephrosis: Secondary | ICD-10-CM

## 2015-11-19 DIAGNOSIS — Z91041 Radiographic dye allergy status: Secondary | ICD-10-CM | POA: Diagnosis not present

## 2015-11-19 DIAGNOSIS — Z9079 Acquired absence of other genital organ(s): Secondary | ICD-10-CM | POA: Diagnosis not present

## 2015-11-19 DIAGNOSIS — Z880 Allergy status to penicillin: Secondary | ICD-10-CM | POA: Diagnosis not present

## 2015-11-19 DIAGNOSIS — Z888 Allergy status to other drugs, medicaments and biological substances status: Secondary | ICD-10-CM | POA: Diagnosis not present

## 2015-11-19 DIAGNOSIS — Z436 Encounter for attention to other artificial openings of urinary tract: Secondary | ICD-10-CM | POA: Insufficient documentation

## 2015-11-19 LAB — BASIC METABOLIC PANEL
Anion gap: 7 (ref 5–15)
BUN: 13 mg/dL (ref 6–20)
CALCIUM: 9.6 mg/dL (ref 8.9–10.3)
CO2: 29 mmol/L (ref 22–32)
Chloride: 109 mmol/L (ref 101–111)
Creatinine, Ser: 0.65 mg/dL (ref 0.44–1.00)
GFR calc Af Amer: >60 mL/min (ref >60)
GLUCOSE: 96 mg/dL (ref 65–99)
Potassium: 4.2 mmol/L (ref 3.5–5.1)
Sodium: 145 mmol/L (ref 135–145)

## 2015-11-19 LAB — CBC
HEMATOCRIT: 39.5 % (ref 36.0–46.0)
Hemoglobin: 13.2 g/dL (ref 12.0–15.0)
MCH: 31.1 pg (ref 26.0–34.0)
MCHC: 33.4 g/dL (ref 30.0–36.0)
MCV: 93.2 fL (ref 78.0–100.0)
Platelets: 239 10*3/uL (ref 150–400)
RBC: 4.24 MIL/uL (ref 3.87–5.11)
RDW: 12.6 % (ref 11.5–15.5)
WBC: 3.8 10*3/uL — ABNORMAL LOW (ref 4.0–10.5)

## 2015-11-19 LAB — PROTIME-INR
INR: 1.1 (ref 0.00–1.49)
PROTHROMBIN TIME: 14 s (ref 11.6–15.2)

## 2015-11-19 LAB — APTT: APTT: 27 s (ref 24–37)

## 2015-11-19 MED ORDER — IOPAMIDOL (ISOVUE-300) INJECTION 61%
20.0000 mL | Freq: Once | INTRAVENOUS | Status: AC | PRN
  Administered 2015-11-19: 20 mL

## 2015-11-19 MED ORDER — CIPROFLOXACIN IN D5W 400 MG/200ML IV SOLN
400.0000 mg | INTRAVENOUS | Status: AC
  Administered 2015-11-19: 400 mg via INTRAVENOUS

## 2015-11-19 MED ORDER — LIDOCAINE HCL 1 % IJ SOLN
INTRAMUSCULAR | Status: AC
  Filled 2015-11-19: qty 20

## 2015-11-19 MED ORDER — MIDAZOLAM HCL 2 MG/2ML IJ SOLN
INTRAMUSCULAR | Status: AC
  Filled 2015-11-19: qty 6

## 2015-11-19 MED ORDER — IOPAMIDOL (ISOVUE-300) INJECTION 61%: 50.0000 mL | Freq: Once | INTRAVENOUS | Status: DC | PRN

## 2015-11-19 MED ORDER — FENTANYL CITRATE (PF) 100 MCG/2ML IJ SOLN
INTRAMUSCULAR | Status: AC
  Filled 2015-11-19: qty 4

## 2015-11-19 MED ORDER — MIDAZOLAM HCL 2 MG/2ML IJ SOLN
INTRAMUSCULAR | Status: AC | PRN
  Administered 2015-11-19 (×2): 1 mg via INTRAVENOUS

## 2015-11-19 MED ORDER — DIPHENHYDRAMINE HCL 25 MG PO CAPS
25.0000 mg | ORAL_CAPSULE | Freq: Once | ORAL | Status: AC
  Administered 2015-11-19: 25 mg via ORAL
  Filled 2015-11-19: qty 1

## 2015-11-19 MED ORDER — FENTANYL CITRATE (PF) 100 MCG/2ML IJ SOLN
INTRAMUSCULAR | Status: AC | PRN
  Administered 2015-11-19: 50 ug via INTRAVENOUS
  Administered 2015-11-19: 25 ug via INTRAVENOUS

## 2015-11-19 MED ORDER — SODIUM CHLORIDE 0.9 % IV SOLN
INTRAVENOUS | Status: DC
  Administered 2015-11-19: 08:00:00 via INTRAVENOUS

## 2015-11-19 MED ORDER — CIPROFLOXACIN IN D5W 400 MG/200ML IV SOLN
INTRAVENOUS | Status: AC
  Filled 2015-11-19: qty 200

## 2015-11-19 MED ORDER — LIDOCAINE HCL 1 % IJ SOLN
INTRAMUSCULAR | Status: AC | PRN
  Administered 2015-11-19: 10 mL via INTRADERMAL

## 2015-11-19 MED ORDER — LIDOCAINE HCL 1 % IJ SOLN
INTRAMUSCULAR | Status: DC | PRN
  Administered 2015-11-19: 10 mL

## 2015-11-19 NOTE — Discharge Instructions (Signed)
Percutaneous Nephrostomy, Care After Refer to this sheet in the next few weeks. These instructions provide you with information on caring for yourself after your procedure. Your health care provider may also give you more specific instructions. Your treatment has been planned according to current medical practices, but problems sometimes occur. Call your health care provider if you have any problems or questions after your procedure. WHAT TO EXPECT AFTER THE PROCEDURE You will need to remain lying down for several hours. HOME CARE INSTRUCTIONS  Your nephrostomy tube is connected to a leg bag or bedside drainage bag. Always keep the tubing, the leg bag, or the bedside drainage bags below the level of the kidney so that the urine drains freely.  During the day, if you are connecting the nephrostomy tube to a leg bag, be sure there are no kinks in the tubing and that the urine is draining freely.  At night, you may want to connect the nephrostomy tube or the leg bag to a larger bedside drainage bag.  Change the dressing as often as directed by your health care provider, or if it becomes wet.  Gently remove the tapes and dressing from around the nephrostomy tube. Be careful not to pull on the tube while removing the dressing.  Wash the skin around the tube, rinse well, and dry.  Place two split drain sponges in and around the tube exit site.  Place tape around edge of the dressing.  Secure the nephrostomy tubing. Remember to make certain that the nephrostomy tube does not kink or become pinched closed. It can be useful to wrap any exposed tubing going from the nephrostomy tube to any of the connecting tubes to either the leg bag or drainage bag with an elastic bandage.  Every three weeks, replace the leg bag, drainage bag, and any extension tubing connected to your nephrostomy tube. Your health care provider will explain how to change the drainage bag and extension tubing. SEEK MEDICAL CARE  IF:  You experience any problems with any of the valves or tubing.  You have persistent pain or soreness in your back.  You have a fever or chills. SEEK IMMEDIATE MEDICAL CARE IF:  You have abdominal pain during the first week.  You have a new appearance of blood in your urine.  You have back pain that is not relieved by your medicine.  You have drainage, redness, swelling, or pain at the tube insertion site.  You have decreased urine output.  Your nephrostomy tube comes out.   This information is not intended to replace advice given to you by your health care provider. Make sure you discuss any questions you have with your health care provider.   Document Released: 03/24/2004 Document Revised: 08/22/2014 Document Reviewed: 03/28/2013 Elsevier Interactive Patient Education 2016 Elsevier Inc. Moderate Conscious Sedation, Adult Sedation is the use of medicines to promote relaxation and relieve discomfort and anxiety. Moderate conscious sedation is a type of sedation. Under moderate conscious sedation you are less alert than normal but are still able to respond to instructions or stimulation. Moderate conscious sedation is used during short medical and dental procedures. It is milder than deep sedation or general anesthesia and allows you to return to your regular activities sooner. LET [facility] CARE PROVIDER KNOW ABOUT:   Any allergies you have.  All medicines you are taking, including vitamins, herbs, eye drops, creams, and over-the-counter medicines.  Use of steroids (by mouth or creams).  Previous problems you or members of your  family have had with the use of anesthetics.  Any blood disorders you have.  Previous surgeries you have had.  Medical conditions you have.  Possibility of pregnancy, if this applies.  Use of cigarettes, alcohol, or illegal drugs. RISKS AND COMPLICATIONS Generally, this is a safe procedure. However, as with any procedure, problems can  occur. Possible problems include:  Oversedation.  Trouble breathing on your own. You may need to have a breathing tube until you are awake and breathing on your own.  Allergic reaction to any of the medicines used for the procedure. BEFORE THE PROCEDURE  You may have blood tests done. These tests can help show how well your kidneys and liver are working. They can also show how well your blood clots.  A physical exam will be done.  Only take medicines as directed by your health care provider. You may need to stop taking medicines (such as blood thinners, aspirin, or nonsteroidal anti-inflammatory drugs) before the procedure.   Do not eat or drink at least 6 hours before the procedure or as directed by your health care provider.  Arrange for a responsible adult, family member, or friend to take you home after the procedure. He or she should stay with you for at least 24 hours after the procedure, until the medicine has worn off. PROCEDURE   An intravenous (IV) catheter will be inserted into one of your veins. Medicine will be able to flow directly into your body through this catheter. You may be given medicine through this tube to help prevent pain and help you relax.  The medical or dental procedure will be done. AFTER THE PROCEDURE  You will stay in a recovery area until the medicine has worn off. Your blood pressure and pulse will be checked.   Depending on the procedure you had, you may be allowed to go home when you can tolerate liquids and your pain is under control.   This information is not intended to replace advice given to you by your health care provider. Make sure you discuss any questions you have with your health care provider.   Document Released: 04/26/2001 Document Revised: 08/22/2014 Document Reviewed: 04/08/2013 Elsevier Interactive Patient Education [employer].

## 2015-11-19 NOTE — Progress Notes (Signed)
Rowe Robert PA in to see patient. Dicussed patient"s redness and hives at IV site. Order obtained for Benadryl and stated that she does not have to record drainage from NT and does not have to irrigate [place]/

## 2015-11-19 NOTE — Progress Notes (Signed)
Redness and hives at IV site no longer present. No complaints of itching to area. Stacey Drain

## 2015-11-19 NOTE — Procedures (Signed)
Unsuccessful at crossing distal left ureter obstruction Left 64fr pcn replaced No comp Stable Full report in PACS

## 2015-11-19 NOTE — H&P (Signed)
Referring Physician(s): Herrick,Benjamin  Supervising Physician: Daryll Brod  Chief Complaint: Left hydronephrosis   Subjective: Patient familiar to IR service from prior left percutaneous nephrostomy on 10/06/15 as well as unsuccessful attempt at left ureteral stent placement on 10/22/15. Patient has a history of acute onset of left hydronephrosis and left flank pain following extensive complex pelvic surgery for chronic pain (underwent BSO/cervical stump removal 09/28/15). At the time of surgery it was very difficult to identify the left ureter due to extensive scar tissue and bleeding from the ovarian vasculature. Several bleeding vessels were ligated with Vicryl suture. There was concern at that time for ureteral occlusion with possible injury or ligation. Nephrostogram performed on 10/22/15 revealed a high-grade distal left ureteral obstruction which could not be traversed with a variety of catheters. The left nephrostomy catheter was exchanged and left to external drainage. She presents again today for reattempt at left ureteral stent placement. She currently denies fevers, chills, headache, chest pain, dyspnea, cough, abdominal/back pain, nausea, vomiting or abnormal bleeding. Her left nephrostomy is draining yellow urine.   Allergies: Contrast media; Dilaudid; Erythromycin; Penicillins; and Reglan; possible vancomycin  Medications: Prior to Admission medications   Medication Sig Start Date End Date Taking? Authorizing Provider  calcium citrate-vitamin D (CITRACAL+D) 315-200 MG-UNIT per tablet Take 1 tablet by mouth 4 (four) times daily.   Yes Historical Provider, MD  cholecalciferol (VITAMIN D) 1000 UNITS tablet Take 1,000 Units by mouth daily.     Yes Historical Provider, MD  frovatriptan (FROVA) 2.5 MG tablet Take 2.5 mg by mouth as needed for migraine. If recurs, may repeat after 2 hours. Max of 3 tabs in 24 hours.   Yes Historical Provider, MD  gabapentin (NEURONTIN) 300 MG  capsule TAKE 1 CAPSULE (300 MG TOTAL) BY MOUTH AT BEDTIME. 10/26/15  Yes Chauncey Cruel, MD  levothyroxine (SYNTHROID, LEVOTHROID) 75 MCG tablet Take 75 mcg by mouth daily before breakfast.   Yes Historical Provider, MD  liothyronine (CYTOMEL) 25 MCG tablet Take 25 mcg by mouth daily.   Yes Historical Provider, MD  oxyCODONE-acetaminophen (PERCOCET) 7.5-325 MG tablet Take 1 tablet by mouth every 4 (four) hours as needed for severe pain. 09/30/15  Yes Arvella Nigh, MD     Vital Signs:Blood pressure 109/71, heart rate 72, temperature 98.2, respirations 16, O2 sats 100% room air  Physical Exam patient awake, alert. Chest clear to auscultation bilaterally. Heart with regular rate and rhythm. Abdomen soft, positive bowel sounds, nontender. Left percutaneous nephrostomy catheter intact, insertion site okay, draining yellow urine. Lower extremities with no edema.  Imaging: No results found.  Labs:  CBC:  Recent Labs  09/29/15 0442 10/07/15 0112 10/22/15 0800 11/19/15 0800  WBC 8.0 4.3 3.7* 3.8*  HGB 9.4* 12.4 12.0 13.2  HCT 27.5* 33.7* 37.5 39.5  PLT 164 158 284 239    COAGS:  Recent Labs  10/22/15 0800 11/19/15 0800  INR 1.14 1.10  APTT  --  27    BMP:  Recent Labs  09/28/15 0633 09/28/15 0700 10/07/15 0112 10/22/15 0800 11/19/15 0800  NA 143 143 138 144 145  K 4.1 4.1 3.5 4.2 4.2  CL 106 102 105 109 109  CO2 30  --  25 26 29   GLUCOSE 104* 97 117* 103* 96  BUN 12 12 11 12 13   CALCIUM 9.6  --  8.5* 9.6 9.6  CREATININE 0.66 0.60 0.88 0.53 0.65  GFRNONAA >60  --  >60 >60 >60  GFRAA >60  --  >  60 >60 >60    LIVER FUNCTION TESTS:  Recent Labs  12/11/14 1338  BILITOT 0.51  AST 15  ALT 17  ALKPHOS 44  PROT 6.5  ALBUMIN 4.2    Assessment and Plan: Patient with history of acute onset of left hydronephrosis and left flank pain following extensive complex pelvic surgery for chronic pain (underwent BSO/cervical stump removal 09/28/15). At the time of surgery it  was very difficult to identify the left ureter due to extensive scar tissue and bleeding from the ovarian vasculature. Several bleeding vessels were ligated with Vicryl suture. There was concern at that time for ureteral occlusion with possible injury or ligation. Nephrostogram performed on 10/22/15 revealed a high-grade distal left ureteral obstruction which could not be traversed with a variety of catheters. The left nephrostomy catheter was exchanged and left to external drainage. She presents again today for reattempt at left ureteral stent placement. Details/risks of procedure, including but not limited to, internal bleeding, infection, ureteral injury, inability to place stent and need for prolonged external nephrostomy drainage discussed with patient and husband with their understanding and consent.   Electronically Signed: D. Rowe Robert 11/19/2015, 10:06 AM   I spent a total of 20 minutes at the the patient's bedside AND on the patient's hospital floor or unit, greater than 50% of which was counseling/coordinating care for attempted left ureteral stent placement

## 2015-11-23 ENCOUNTER — Other Ambulatory Visit: Payer: Self-pay | Admitting: Urology

## 2015-12-03 ENCOUNTER — Other Ambulatory Visit (HOSPITAL_COMMUNITY): Payer: Self-pay | Admitting: Interventional Radiology

## 2015-12-03 DIAGNOSIS — N135 Crossing vessel and stricture of ureter without hydronephrosis: Secondary | ICD-10-CM

## 2015-12-23 ENCOUNTER — Other Ambulatory Visit: Payer: Self-pay | Admitting: *Deleted

## 2015-12-23 DIAGNOSIS — C50912 Malignant neoplasm of unspecified site of left female breast: Secondary | ICD-10-CM

## 2015-12-24 ENCOUNTER — Other Ambulatory Visit (HOSPITAL_BASED_OUTPATIENT_CLINIC_OR_DEPARTMENT_OTHER): Payer: BLUE CROSS/BLUE SHIELD

## 2015-12-24 DIAGNOSIS — Z853 Personal history of malignant neoplasm of breast: Secondary | ICD-10-CM | POA: Diagnosis not present

## 2015-12-24 DIAGNOSIS — C50912 Malignant neoplasm of unspecified site of left female breast: Secondary | ICD-10-CM

## 2015-12-24 LAB — CBC WITH DIFFERENTIAL/PLATELET
BASO%: 0.9 % (ref 0.0–2.0)
Basophils Absolute: 0 10*3/uL (ref 0.0–0.1)
EOS%: 2.7 % (ref 0.0–7.0)
Eosinophils Absolute: 0.1 10*3/uL (ref 0.0–0.5)
HCT: 37.7 % (ref 34.8–46.6)
HGB: 12.5 g/dL (ref 11.6–15.9)
LYMPH%: 28.4 % (ref 14.0–49.7)
MCH: 30.3 pg (ref 25.1–34.0)
MCHC: 33.2 g/dL (ref 31.5–36.0)
MCV: 91.3 fL (ref 79.5–101.0)
MONO#: 0.5 10*3/uL (ref 0.1–0.9)
MONO%: 10.1 % (ref 0.0–14.0)
NEUT%: 57.9 % (ref 38.4–76.8)
NEUTROS ABS: 2.6 10*3/uL (ref 1.5–6.5)
Platelets: 268 10*3/uL (ref 145–400)
RBC: 4.13 10*6/uL (ref 3.70–5.45)
RDW: 12.3 % (ref 11.2–14.5)
WBC: 4.4 10*3/uL (ref 3.9–10.3)
lymph#: 1.3 10*3/uL (ref 0.9–3.3)

## 2015-12-24 LAB — COMPREHENSIVE METABOLIC PANEL
ALT: 13 U/L (ref 0–55)
ANION GAP: 8 meq/L (ref 3–11)
AST: 12 U/L (ref 5–34)
Albumin: 3.8 g/dL (ref 3.5–5.0)
Alkaline Phosphatase: 58 U/L (ref 40–150)
BUN: 13 mg/dL (ref 7.0–26.0)
CALCIUM: 9.8 mg/dL (ref 8.4–10.4)
CHLORIDE: 103 meq/L (ref 98–109)
CO2: 29 meq/L (ref 22–29)
Creatinine: 0.8 mg/dL (ref 0.6–1.1)
EGFR: 84 mL/min/{1.73_m2} — AB (ref >90)
Glucose: 90 mg/dl (ref 70–140)
POTASSIUM: 5 meq/L (ref 3.5–5.1)
Sodium: 140 mEq/L (ref 136–145)
Total Bilirubin: 0.49 mg/dL (ref 0.20–1.20)
Total Protein: 6.6 g/dL (ref 6.4–8.3)

## 2015-12-31 ENCOUNTER — Ambulatory Visit (HOSPITAL_BASED_OUTPATIENT_CLINIC_OR_DEPARTMENT_OTHER): Payer: BLUE CROSS/BLUE SHIELD | Admitting: Nurse Practitioner

## 2015-12-31 ENCOUNTER — Encounter: Payer: Self-pay | Admitting: Nurse Practitioner

## 2015-12-31 ENCOUNTER — Telehealth: Payer: Self-pay | Admitting: Oncology

## 2015-12-31 VITALS — BP 122/58 | HR 84 | Temp 98.0°F | Resp 20 | Ht 63.0 in | Wt 106.3 lb

## 2015-12-31 DIAGNOSIS — C50912 Malignant neoplasm of unspecified site of left female breast: Secondary | ICD-10-CM

## 2015-12-31 DIAGNOSIS — Z853 Personal history of malignant neoplasm of breast: Secondary | ICD-10-CM

## 2015-12-31 NOTE — Patient Instructions (Signed)
Thank you for coming in today!  As we discussed, please continue to perform your self breast exam and report any changes. If you note any new symptoms (please see below), be sure to notify us ASAP.   We'll have you return in one year's time for your next appointment or sooner if you have any problems. Please be sure to stop by scheduling on your way out to make those appointment(s).  Looking forward to working with you in the future!  Let us know if you have any questions!  Symptoms to Watch for and Report to Your Provider  . Return of the cancer symptoms you had before - such as a lump or change in the area of your incision (or changes in reconstructed breast, if reconstruction completed) . New or unusual pain that seems unrelated to an injury and does not go away, including back pain or bone pain . Weight loss without trying/intending . Unexplained bleeding . A rash or allergic reaction, such as swelling, severe itching or wheezing . Chills or fevers . Persistent headaches . Shortness of breath or difficulty breathing . Bloody stools or blood in your urine . Nausea, vomiting, diarrhea, loss of appetite, or trouble swallowing . A cough that does not go away . Abdominal pain . Swelling in your arms or legs . Fractures . Hot flashes or other menopausal symptoms . Any other signs mentioned by your doctor or nurse or any unusual symptoms                 that you just can't explain   NOTE: Just because you have certain symptoms, it doesn't mean the cancer has come back or you have a new cancer. Symptoms can be due to other problems that need to be addressed.  It is important to watch for these symptoms and report them to your provider so you can be medically evaluated for any of these concerns!    Living a Life of Wellness After Cancer:  *Note: Please consult your health care provider before using any medications, supplements, over-the-counter products, or other interventions.  Also, please  consult your primary care provider before you begin any lifestyle program (diet, exercise, etc.).  Your safety is our top priority and we want to make sure you continue to live a long and healthy life!    Healthy Lifestyle Recommendations  As a cancer survivor, it is important develop a lifelong commitment to a healthy lifestyle. A healthy lifestyle can prevent cancer from returning as well as prevent other diseases like heart disease, diabetes and high blood pressure.  These are some things that you can do to have a healthy lifestyle:  Marland Kitchen Maintain a healthy weight.  . Exercise daily per your doctor's orders. . Eat a balanced diet high in fruits, vegetables, bran, and fiber. Limit intake of red meat      and processed foods.  . Limit how much alcohol you consume, if at all. Ali Lowe regular bone mineral density testing for osteoporosis.  . Talk to your doctor about cardiovascular disease or "heart disease" screening. . Stop smoking (if you smoke). . Know your family history. . Be mindful of your emotional, social, and spiritual needs. . Meet regularly with a Primary Care Provider (PCP). Find a PCP if you do not             already have one. . Talk to your doctor about regular cancer screening including screening for colon  cancer, GYN cancers, and skin cancer.

## 2015-12-31 NOTE — Progress Notes (Signed)
CLINIC:  Cancer Survivorship   REASON FOR VISIT:  Routine follow-up post-treatment for history of breast cancer.  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%), HER2/neu negative, Ki67 26%   06/14/2011 Pathologic Stage Stage IA: T1b N0   06/14/2011 Oncotype testing RS 29 (19% ROR)    - 10/03/2011 Chemotherapy Adjuvant docetaxel and cyclophosphamide x 4   11/2011 -  Anti-estrogen oral therapy Letrozole begun, changed to tamoxifen 07/2012; stopped in 11/2012 and resumed at 10 mg 03/2013; stopped September 2014 with further attempts d/c'd   03/09/2015 Procedure Custom Gene panel revealed no deleterious mutations at ATM, BAP1, BARD1, BRCA1, BRCA2, BRIP1, CDH1, CDK4, CDKN2A, CHEK2, EPCAM, FANCC, MITF, MLH1, MSH2, MSH6, NBN, PALB2, PMS2, PTEN, RAD51Variant of Unknown Significance in Beth PALB2 gene called c.1061C>A.    INTERVAL HISTORY:  Ms. Tabak presents to [facility] today for ongoing follow up regarding her history of breast cancer. Overall, Ms. Raider reports feeling doing well since her last visit last year from a breast cancer standpoint.  She completed genetic counseling and testing in 02/2015 where she was found to have a variant of unknown significance in Beth PALB2 gene, but no deleterious mutations. She underwent laparoscopy with BSO (benign pathology) and cervical stump cystoscopy in 01/1442 that was complicated by bleeding.  When Dr. Radene Knee cauterized one of Beth vessels, there was some inadvertent damage to her ureter which has led to multiple complications.  She has undergone 3 attempts of ureteral stenting, which have been unsuccessful and she has a left nephrostomy tube in place.  She is scheduled for open left ureter reimplantation under Beth direction of Dr. Louis Meckel next week (01/07/2016).   She has not noticed any change along  her chest wall. She has lost 14 pounds since her last visit with Dr. Jana Hakim with six of those pounds lost Beth first week after surgery in 09/2015.  She exercises daily by walking her dog and denies any dyspnea.  She denies any increased headache (h/o migraines), cough, shortness of breath, or bone pain. She reports a fair appetite (worse with stress).  REVIEW OF SYSTEMS:  General: Denies fever, chills, unintentional weight loss, or generalized fatigue.  HEENT: Denies visual changes, hearing loss, mouth sores, or difficulty swallowing. Cardiac: Denies palpitations and lower extremity edema.  Respiratory: Denies wheeze or dyspnea on exertion.  Breast: As above. GI: Denies abdominal pain, constipation, diarrhea, nausea, or vomiting.  GU: As above. Musculoskeletal: Denies joint or bone pain.  Neuro: Denies recent fall or numbness / tingling in her extremities.  Skin: Denies rash, pruritis, or open wounds.  Psych: Denies depression, anxiety, insomnia, or memory loss.   A 14-point review of systems was completed and was negative, except as noted above.   ONCOLOGY TREATMENT TEAM:  1. Surgeon:  Dr. Margot Chimes at [facility]  2. Medical Oncologist: Dr. Jana Hakim     PAST MEDICAL/SURGICAL HISTORY:  Past Medical History  Diagnosis Date  . Hypothyroidism   . Osteoporosis   . Wears glasses   . Family history of breast cancer   . Family history of melanoma   . Breast cancer, IDC, Left, ER +, PR -, Her2 -. dx 06/01/2011--- oncologist-  dr Jana Hakim-- per last note no recurrence    Stage IA (T1b, N0), grade 2---- s/p  left total mastectomy w/ node disssection & right prophylactic total mastectomy 06-14-2011  and  chemotherapy complete 10-03-2011 (anti-estrogen therapry ended 09/ 2014)  . Pelvic adhesions   . Pelvic pain in female   . Migraine   . History of colitis     COLLAGENOUS  . PONV (postoperative nausea and vomiting)   . Arthritis    Past Surgical History  Procedure  Laterality Date  . Orif femur fracture  1969    and Humerus decompression  . Colonoscopy  2012  . Portacath placement  07/26/2011    Procedure: INSERTION PORT-A-CATH;  Surgeon: Haywood Lasso, MD;  Location: [place];  Service: General;  Laterality: N/A;  port a cath placement  . Abdominal hysterectomy  2006  . Port-a-cath removal  10/24/2011    Procedure: MINOR REMOVAL PORT-A-CATH;  Surgeon: Haywood Lasso, MD;  Location: [place];  Service: General;  Laterality: Right;  removal of portacath  . Mass excision  03/12/2012    Procedure: MINOR EXCISION OF MASS;  Surgeon: Haywood Lasso, MD;  Location: [place];  Service: General;  Laterality: Right;  removal part of port-a-cath scar  . Carpal tunnel release Right 02/05/2013    Procedure: CARPAL TUNNEL RELEASE;  Surgeon: Cammie Sickle., MD;  Location: [place];  Service: Orthopedics;  Laterality: Right;  . Laparoscopic ovarian cystectomy  2003  . Breast surgery  2004    excision left breast fibroadenoma  . Left total mastectomy with lymph node dissection/  right prophylactic mastectomy  06-14-2011  . Negative sleep study  2016  per pt  . Laparoscopic bilateral salpingo oopherectomy Bilateral 09/28/2015    Procedure:  ATTEMPTED LAPAROSCOPIC CONVERTED TO OPEN LAPAROSCOPY WITH BILATERAL SALPINGO OOPHORECTOMY AND  CERVICAL STUMP;  Surgeon: Arvella Nigh, MD;  Location: [place];  Service: Gynecology;  Laterality: Bilateral;  . Cystoscopy Bilateral 09/28/2015    Procedure: CYSTOSCOPY;  Surgeon: Arvella Nigh, MD;  Location: [facility];  Service: Gynecology;  Laterality: Bilateral;  . Cystoscopy w/ ureteral stent placement Bilateral 10/06/2015    Procedure: CYSTOSCOPY WITH BILATERAL  RETROGRADE PYELOGRAM / LEFT SEMI-RIGID URETEROSCOPY/ LEFT URETERAL EXPLORATION;  Surgeon: Ardis Hughs, MD;  Location: [facility];  Service: Urology;  Laterality:  Bilateral;     ALLERGIES:  Allergies  Allergen Reactions  . Ciprofloxacin Other (See Comments)    Redness at IV site - Red Man Syndrome - IV may have been run in to fast causing this reaction  . Dilaudid [Hydromorphone Hcl] Nausea And Vomiting  . Erythromycin Nausea And Vomiting  . Penicillins Hives    Has patient had a PCN reaction causing immediate rash, facial/tongue/throat swelling, SOB or lightheadedness with hypotension: NO Has patient had a PCN reaction causing severe rash involving mucus membranes or skin necrosis: NO Has patient had a PCN reaction that required hospitalization NO Has patient had a PCN reaction occurring within Beth last 10 years: NO If all of Beth above answers are "NO", then may proceed with Cephalosporin use.  . Reglan [Metoclopramide Hcl] Diarrhea  . Vancomycin Other (See Comments)    Redness/itching at IV site - Red Man Syndrome - IV may have been run in to fast causing this reaction      CURRENT MEDICATIONS:  Current Outpatient Prescriptions on File Prior to Visit  Medication Sig Dispense Refill  . aspirin-acetaminophen-caffeine (EXCEDRIN MIGRAINE) 250-250-65 MG tablet Take 1 tablet by mouth every 6 (six) hours as needed for headache or migraine.    . cholecalciferol (VITAMIN D) 1000 UNITS tablet Take  1,000 Units by mouth daily.      . frovatriptan (FROVA) 2.5 MG tablet Take 2.5 mg by mouth as needed for migraine. If recurs, may repeat after 2 hours. Max of 3 tabs in 24 hours.    . gabapentin (NEURONTIN) 300 MG capsule TAKE 1 CAPSULE (300 MG TOTAL) BY MOUTH AT BEDTIME. (Patient taking differently: TAKE 1 CAPSULE (300 MG TOTAL) BY MOUTH AT BEDTIME AS NEEDED FOR PAIN.) 90 capsule 4  . levothyroxine (SYNTHROID, LEVOTHROID) 75 MCG tablet Take 75 mcg by mouth daily before breakfast.    . liothyronine (CYTOMEL) 5 MCG tablet Take 5 mcg by mouth daily.    . [DISCONTINUED] promethazine (PHENERGAN) 25 MG tablet      No current facility-administered medications on  file prior to visit.     ONCOLOGIC FAMILY HISTORY:  Family History  Problem Relation Age of Onset  . Breast cancer Mother 32  . Dementia Mother   . Breast cancer Cousin 53    paternal cousin with bilateral breast cancer  . Heart attack Father 72  . Melanoma Father 74  . Melanoma Brother 50  . Heart attack Maternal Aunt 38  . Parkinson's disease Paternal Aunt   . Heart attack Maternal Grandmother 76  . COPD Maternal Grandfather 10  . Hypertension Paternal Grandmother   . Stroke Paternal Grandmother   . Dementia Maternal Aunt   . Ovarian cancer Other     MGM's mother     GENETIC COUNSELING/TESTING: Yes, performed 03/09/2015: Custom Gene panel revealed no deleterious mutations at ATM, BAP1, BARD1, BRCA1, BRCA2, BRIP1, CDH1, CDK4, CDKN2A, CHEK2, EPCAM, FANCC, MITF, MLH1, MSH2, MSH6, NBN, PALB2, PMS2, PTEN, RAD51. Variant of Unknown Significance in Beth PALB2 gene called c.1061C>A.  SOCIAL HISTORY:  CHRISTYANNA MCKEON is married and lives with her family in [place], [place].  She has 2 children. 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  She is a former smoker but denies any alcohol or illicit drug use.     PHYSICAL EXAMINATION:  Vital Signs: Filed Vitals:   12/31/15 1414  BP: 122/58  Pulse: 84  Temp: 98 F (36.7 C)  Resp: 20   Weight: 106.3 (down 14 lb since this time last year) ECOG performance status: 0 General: Well-nourished, well-appearing female in no acute distress.  She is  unaccompanied in clinic today.   HEENT: Head is atraumatic and normocephalic.  Pupils equal and reactive to light and accomodation. Conjunctivae clear without exudate.  Sclerae anicteric. Oral mucosa is pink, moist, and intact without lesions.  Oropharynx is pink without lesions or erythema.  Lymph: No cervical, supraclavicular, infraclavicular, or axillary lymphadenopathy noted on palpation.  Cardiovascular: Regular rate and rhythm without murmurs, rubs, or gallops. Respiratory:  Clear to auscultation bilaterally. Chest expansion symmetric without accessory muscle use on inspiration or expiration.  Breast: Bilateral breast exam performed.  Bilateral mastectomy incisions intact without nodularity.  No mass or lesion across chest wall bilaterally. GI: Abdomen soft and round. No tenderness to palpation. Bowel sounds normoactive in 4 quadrants. No hepatosplenomegaly.   GU: Deferred. Left nephrostomy bag in place.  Musculoskeletal: Muscle strength 5/5 in all extremities.   Neuro: No focal deficits. Steady gait.  Psych: Mood and affect normal and appropriate for situation.  Extremities: No edema, cyanosis, or clubbing.  Skin: Warm and dry. No open lesions noted.   LABORATORY DATA:  Recent Results (from Beth past 2160 hour(s))  Basic metabolic panel     Status: Abnormal   Collection Time: 10/07/15  1:12 AM  Result Value Ref Range   Sodium 138 135 - 145 mmol/L   Potassium 3.5 3.5 - 5.1 mmol/L   Chloride 105 101 - 111 mmol/L   CO2 25 22 - 32 mmol/L   Glucose, Bld 117 (H) 65 - 99 mg/dL   BUN 11 6 - 20 mg/dL   Creatinine, Ser 0.88 0.44 - 1.00 mg/dL   Calcium 8.5 (L) 8.9 - 10.3 mg/dL   GFR calc non Af Amer >60 >60 mL/min   GFR calc Af Amer >60 >60 mL/min    Comment: (NOTE) Beth eGFR has been calculated using Beth CKD EPI equation. This calculation has not been validated in all clinical situations. eGFR's persistently <60 mL/min signify possible Chronic Kidney Disease.    Anion gap 8 5 - 15  CBC     Status: Abnormal   Collection Time: 10/07/15  1:12 AM  Result Value Ref Range   WBC 4.3 4.0 - 10.5 K/uL   RBC 3.65 (L) 3.87 - 5.11 MIL/uL   Hemoglobin 12.4 12.0 - 15.0 g/dL   HCT 33.7 (L) 36.0 - 46.0 %   MCV 92.3 78.0 - 100.0 fL   MCH 34.0 26.0 - 34.0 pg   MCHC 36.8 (H) 30.0 - 36.0 g/dL   RDW 13.1 11.5 - 15.5 %   Platelets 158 150 - 400 K/uL  Basic metabolic panel     Status: Abnormal   Collection Time: 10/22/15  8:00 AM  Result Value Ref Range   Sodium 144 135 -  145 mmol/L   Potassium 4.2 3.5 - 5.1 mmol/L   Chloride 109 101 - 111 mmol/L   CO2 26 22 - 32 mmol/L   Glucose, Bld 103 (H) 65 - 99 mg/dL   BUN 12 6 - 20 mg/dL   Creatinine, Ser 0.53 0.44 - 1.00 mg/dL   Calcium 9.6 8.9 - 10.3 mg/dL   GFR calc non Af Amer >60 >60 mL/min   GFR calc Af Amer >60 >60 mL/min    Comment: (NOTE) Beth eGFR has been calculated using Beth CKD EPI equation. This calculation has not been validated in all clinical situations. eGFR's persistently <60 mL/min signify possible Chronic Kidney Disease.    Anion gap 9 5 - 15  CBC with Differential/Platelet     Status: Abnormal   Collection Time: 10/22/15  8:00 AM  Result Value Ref Range   WBC 3.7 (L) 4.0 - 10.5 K/uL   RBC 3.92 3.87 - 5.11 MIL/uL   Hemoglobin 12.0 12.0 - 15.0 g/dL   HCT 37.5 36.0 - 46.0 %   MCV 95.7 78.0 - 100.0 fL   MCH 30.6 26.0 - 34.0 pg   MCHC 32.0 30.0 - 36.0 g/dL   RDW 13.3 11.5 - 15.5 %   Platelets 284 150 - 400 K/uL   Neutrophils Relative % 58 %   Neutro Abs 2.2 1.7 - 7.7 K/uL   Lymphocytes Relative 28 %   Lymphs Abs 1.1 0.7 - 4.0 K/uL   Monocytes Relative 7 %   Monocytes Absolute 0.3 0.1 - 1.0 K/uL   Eosinophils Relative 6 %   Eosinophils Absolute 0.2 0.0 - 0.7 K/uL   Basophils Relative 1 %   Basophils Absolute 0.0 0.0 - 0.1 K/uL  Protime-INR     Status: None   Collection Time: 10/22/15  8:00 AM  Result Value Ref Range   Prothrombin Time 14.3 11.6 - 15.2 seconds   INR 1.14 0.00 - 1.49  APTT  Status: None   Collection Time: 11/19/15  8:00 AM  Result Value Ref Range   aPTT 27 24 - 37 seconds  Basic metabolic panel     Status: None   Collection Time: 11/19/15  8:00 AM  Result Value Ref Range   Sodium 145 135 - 145 mmol/L   Potassium 4.2 3.5 - 5.1 mmol/L   Chloride 109 101 - 111 mmol/L   CO2 29 22 - 32 mmol/L   Glucose, Bld 96 65 - 99 mg/dL   BUN 13 6 - 20 mg/dL   Creatinine, Ser 0.65 0.44 - 1.00 mg/dL   Calcium 9.6 8.9 - 10.3 mg/dL   GFR calc non Af Amer >60 >60 mL/min    GFR calc Af Amer >60 >60 mL/min    Comment: (NOTE) Beth eGFR has been calculated using Beth CKD EPI equation. This calculation has not been validated in all clinical situations. eGFR's persistently <60 mL/min signify possible Chronic Kidney Disease.    Anion gap 7 5 - 15  CBC     Status: Abnormal   Collection Time: 11/19/15  8:00 AM  Result Value Ref Range   WBC 3.8 (L) 4.0 - 10.5 K/uL   RBC 4.24 3.87 - 5.11 MIL/uL   Hemoglobin 13.2 12.0 - 15.0 g/dL   HCT 39.5 36.0 - 46.0 %   MCV 93.2 78.0 - 100.0 fL   MCH 31.1 26.0 - 34.0 pg   MCHC 33.4 30.0 - 36.0 g/dL   RDW 12.6 11.5 - 15.5 %   Platelets 239 150 - 400 K/uL  Protime-INR     Status: None   Collection Time: 11/19/15  8:00 AM  Result Value Ref Range   Prothrombin Time 14.0 11.6 - 15.2 seconds   INR 1.10 0.00 - 1.49  CBC with Differential     Status: None   Collection Time: 12/24/15  9:55 AM  Result Value Ref Range   WBC 4.4 3.9 - 10.3 10e3/uL   NEUT# 2.6 1.5 - 6.5 10e3/uL   HGB 12.5 11.6 - 15.9 g/dL   HCT 37.7 34.8 - 46.6 %   Platelets 268 145 - 400 10e3/uL   MCV 91.3 79.5 - 101.0 fL   MCH 30.3 25.1 - 34.0 pg   MCHC 33.2 31.5 - 36.0 g/dL   RBC 4.13 3.70 - 5.45 10e6/uL   RDW 12.3 11.2 - 14.5 %   lymph# 1.3 0.9 - 3.3 10e3/uL   MONO# 0.5 0.1 - 0.9 10e3/uL   Eosinophils Absolute 0.1 0.0 - 0.5 10e3/uL   Basophils Absolute 0.0 0.0 - 0.1 10e3/uL   NEUT% 57.9 38.4 - 76.8 %   LYMPH% 28.4 14.0 - 49.7 %   MONO% 10.1 0.0 - 14.0 %   EOS% 2.7 0.0 - 7.0 %   BASO% 0.9 0.0 - 2.0 %  Comprehensive metabolic panel     Status: Abnormal   Collection Time: 12/24/15  9:55 AM  Result Value Ref Range   Sodium 140 136 - 145 mEq/L   Potassium 5.0 3.5 - 5.1 mEq/L   Chloride 103 98 - 109 mEq/L   CO2 29 22 - 29 mEq/L   Glucose 90 70 - 140 mg/dl    Comment: Glucose reference range is for nonfasting patients. Fasting glucose reference range is 70- 100.   BUN 13.0 7.0 - 26.0 mg/dL   Creatinine 0.8 0.6 - 1.1 mg/dL   Total Bilirubin 0.49 0.20 -  1.20 mg/dL   Alkaline Phosphatase 58 40 - 150 U/L  AST 12 5 - 34 U/L   ALT 13 0 - 55 U/L   Total Protein 6.6 6.4 - 8.3 g/dL   Albumin 3.8 3.5 - 5.0 g/dL   Calcium 9.8 8.4 - 10.4 mg/dL   Anion Gap 8 3 - 11 mEq/L   EGFR 84 (L) >90 ml/min/1.73 m2    Comment: eGFR is calculated using Beth CKD-EPI Creatinine Equation (2009)       ASSESSMENT AND PLAN:   1. 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  Ms. Davis is doing well with no clinical symptoms worrisome for cancer recurrence at this time. Stable labs. I have reviewed Beth recommendations for ongoing surveillance with her and she will follow-up with her medical oncologist,  Dr. Jana Hakim, in one year's time for her last visit before "graduating."  She is aware that we will continue to monitor data / science about her specific VUTS at [facility] and notify her of any change, but at this time, there is no recommended changes in her monitoring.  She was instructed to make [place] aware if she notes any change within her chest wall, shortness of breath, bone pain of further weight loss.  I believe that her weight loss has resulted from her surgical complications rather than related to her cancer, and look forward to resolution of this with her upcoming surgery.  2. Cancer screening:  Due to Ms. Kratky's history and her age, she should receive screening for skin cancers and colon cancer.  She is S/P hysterectomy.  Beth information and recommendations were shared with Beth patient and in her written after visit summary.  3. Health maintenance and  wellness promotion:Ms. Garfield and I discussed recommendations to maximize nutrition and minimize recurrence, such as increased intake of fruits, vegetables, lean proteins, and minimizing Beth intake of red meats and processed foods.  She was also encouraged to engage in moderate to vigorous exercise for 30 minutes per day most days of Beth week. She was instructed to limit her alcohol consumption and continue to abstain from tobacco use.   A total of 30 minutes of face-to-face time was spent with this patient with greater than 50% of that time in counseling and care-coordination.   Sylvan Cheese, NP  Survivorship Program [facility] [phone]   Note: [name], [place] [phone]

## 2015-12-31 NOTE — Telephone Encounter (Signed)
per pof to sch pt appt-gave pt copy of avs °

## 2016-01-01 ENCOUNTER — Encounter (HOSPITAL_COMMUNITY): Payer: Self-pay

## 2016-01-01 ENCOUNTER — Encounter (HOSPITAL_COMMUNITY)
Admission: RE | Admit: 2016-01-01 | Discharge: 2016-01-01 | Disposition: A | Discharge status: Home / Self Care | Payer: BLUE CROSS/BLUE SHIELD | Source: Ambulatory Visit | Attending: Urology | Admitting: Urology

## 2016-01-01 DIAGNOSIS — N135 Crossing vessel and stricture of ureter without hydronephrosis: Secondary | ICD-10-CM | POA: Diagnosis not present

## 2016-01-01 DIAGNOSIS — Z01812 Encounter for preprocedural laboratory examination: Secondary | ICD-10-CM | POA: Diagnosis not present

## 2016-01-01 DIAGNOSIS — Z0183 Encounter for blood typing: Secondary | ICD-10-CM | POA: Diagnosis not present

## 2016-01-01 NOTE — Pre-Procedure Instructions (Addendum)
CBC/diff and CMP 12-24-15 epic

## 2016-01-01 NOTE — Patient Instructions (Addendum)
Beth Davis  01/01/2016   Your procedure is scheduled on: 01/07/16  Report to [facility] Main  Entrance take [facility]  elevators to 3rd floor to  [place] at 5:30AM.  Call this number if you have problems the morning of surgery [phone]   Remember: ONLY 1 PERSON MAY GO WITH YOU TO SHORT STAY TO GET  READY MORNING OF [place].  Do not eat food or drink liquids :After Midnight.  Follow Bowel prep Instructions from Dr. Carlton Adam office.   Take these medicines the morning of surgery with A SIP OF WATER: Levothyroxine (Synthroid), Liothyronine                                You may not have any metal on your body including hair pins and              piercings  Do not wear jewelry, make-up, lotions, powders or perfumes, deodorant             Do not wear nail polish.  Do not shave  48 hours prior to surgery.              Men may shave face and neck.   Do not bring valuables to the hospital. [place].  Contacts, dentures or bridgework may not be worn into surgery.  Leave suitcase in the car. After surgery it may be brought to your room.                Please read over the following fact sheets you were given: _____________________________________________________________________             [facility] - Preparing for Surgery Before surgery, you can play an important role.  Because skin is not sterile, your skin needs to be as free of germs as possible.  You can reduce the number of germs on your skin by washing with CHG (chlorahexidine gluconate) soap before surgery.  CHG is an antiseptic cleaner which kills germs and bonds with the skin to continue killing germs even after washing. Please DO NOT use if you have an allergy to CHG or antibacterial soaps.  If your skin becomes reddened/irritated stop using the CHG and inform your nurse when you arrive at Short Stay. Do not shave (including legs  and underarms) for at least 48 hours prior to the first CHG shower.  You may shave your face/neck. Please follow these instructions carefully:  1.  Shower with CHG Soap the night before surgery and the  morning of Surgery.  2.  If you choose to wash your hair, wash your hair first as usual with your  normal  shampoo.  3.  After you shampoo, rinse your hair and body thoroughly to remove the  shampoo.                           4.  Use CHG as you would any other liquid soap.  You can apply chg directly  to the skin and wash                       Gently with a scrungie or clean washcloth.  5.  Apply the CHG Soap to your body ONLY FROM THE NECK DOWN.   Do not use on face/ open                           Wound or open sores. Avoid contact with eyes, ears mouth and genitals (private parts).                       Wash face,  Genitals (private parts) with your normal soap.             6.  Wash thoroughly, paying special attention to the area where your surgery  will be performed.  7.  Thoroughly rinse your body with warm water from the neck down.  8.  DO NOT shower/wash with your normal soap after using and rinsing off  the CHG Soap.                9.  Pat yourself dry with a clean towel.            10.  Wear clean pajamas.            11.  Place clean sheets on your bed the night of your first shower and do not  sleep with pets. Day of Surgery : Do not apply any lotions/deodorants the morning of surgery.  Please wear clean clothes to the hospital/surgery center.  FAILURE TO FOLLOW THESE INSTRUCTIONS MAY RESULT IN THE CANCELLATION OF YOUR SURGERY PATIENT SIGNATURE_________________________________  NURSE SIGNATURE__________________________________  ________________________________________________________________________    CLEAR LIQUID DIET   Foods Allowed                                                                     Foods Excluded  Coffee and tea, regular and decaf                              liquids that you cannot  Plain Jell-O in any flavor                                             see through such as: Fruit ices (not with fruit pulp)                                     milk, soups, orange juice  Iced Popsicles                                    All solid food Carbonated beverages, regular and diet                                    Cranberry, grape and apple juices Sports drinks like Gatorade Lightly seasoned clear broth or consume(fat free) Sugar, honey syrup  Sample Menu Breakfast  Lunch                                     Supper Cranberry juice                    Beef broth                            Chicken broth Jell-O                                     Grape juice                           Apple juice Coffee or tea                        Jell-O                                      Popsicle                                                Coffee or tea                        Coffee or tea  _____________________________________________________________________    WHAT IS A BLOOD TRANSFUSION? Blood Transfusion Information  A transfusion is the replacement of blood or some of its parts. Blood is made up of multiple cells which provide different functions.  Red blood cells carry oxygen and are used for blood loss replacement.  White blood cells fight against infection.  Platelets control bleeding.  Plasma helps clot blood.  Other blood products are available for specialized needs, such as hemophilia or other clotting disorders. BEFORE THE TRANSFUSION  Who gives blood for transfusions?   Healthy volunteers who are fully evaluated to make sure their blood is safe. This is blood bank blood. Transfusion therapy is the safest it has ever been in the practice of medicine. Before blood is taken from a donor, a complete history is taken to make sure that person has no history of diseases nor engages in risky social behavior (examples are  intravenous drug use or sexual activity with multiple partners). The donor's travel history is screened to minimize risk of transmitting infections, such as malaria. The donated blood is tested for signs of infectious diseases, such as HIV and hepatitis. The blood is then tested to be sure it is compatible with you in order to minimize the chance of a transfusion reaction. If you or a relative donates blood, this is often done in anticipation of surgery and is not appropriate for emergency situations. It takes many days to process the donated blood. RISKS AND COMPLICATIONS Although transfusion therapy is very safe and saves many lives, the main dangers of transfusion include:   Getting an infectious disease.  Developing a transfusion reaction. This is an allergic reaction to something in the blood you were given. Every precaution is taken to prevent this. The decision to  have a blood transfusion has been considered carefully by your caregiver before blood is given. Blood is not given unless the benefits outweigh the risks. AFTER THE TRANSFUSION  Right after receiving a blood transfusion, you will usually feel much better and more energetic. This is especially true if your red blood cells have gotten low (anemic). The transfusion raises the level of the red blood cells which carry oxygen, and this usually causes an energy increase.  The nurse administering the transfusion will monitor you carefully for complications. HOME CARE INSTRUCTIONS  No special instructions are needed after a transfusion. You may find your energy is better. Speak with your caregiver about any limitations on activity for underlying diseases you may have. SEEK MEDICAL CARE IF:   Your condition is not improving after your transfusion.  You develop redness or irritation at the intravenous (IV) site. SEEK IMMEDIATE MEDICAL CARE IF:  Any of the following symptoms occur over the next 12 hours:  Shaking chills.  You have a  temperature by mouth above 102 F (38.9 C), not controlled by medicine.  Chest, back, or muscle pain.  People around you feel you are not acting correctly or are confused.  Shortness of breath or difficulty breathing.  Dizziness and fainting.  You get a rash or develop hives.  You have a decrease in urine output.  Your urine turns a dark color or changes to pink, red, or brown. Any of the following symptoms occur over the next 10 days:  You have a temperature by mouth above 102 F (38.9 C), not controlled by medicine.  Shortness of breath.  Weakness after normal activity.  The white part of the eye turns yellow (jaundice).  You have a decrease in the amount of urine or are urinating less often.  Your urine turns a dark color or changes to pink, red, or brown. Document Released: 07/29/2000 Document Revised: 10/24/2011 Document Reviewed: 03/17/2008 [facility] Patient Information 2014 [place], [place].  _______________________________________________________________________

## 2016-01-06 MED ORDER — GENTAMICIN SULFATE 40 MG/ML IJ SOLN
240.0000 mg | INTRAVENOUS | Status: AC
  Administered 2016-01-07: 240 mg via INTRAVENOUS
  Filled 2016-01-06 (×3): qty 6

## 2016-01-06 NOTE — H&P (Signed)
Reason For Visit Left distal ureteral occlusion   History of Present Illness This 65 year old white female referred by Dr. Arvella Nigh, M.D. for evaluation and management of left flank pain with associated hydronephrosis. She was subsequently found to have a left distal ureteral occlusion from a complication during an operation which included a BSO and cervical stump removal in which there was difficulty identifying the left ureter secondary to adhesions and bleeding from the ovarian vasculature. The bleeding was controlled with a series of Vicryl ties. 5 days postoperatively when I first saw the patient attempted to pass wire across the left distal ureter unsuccessfully. She subsequently underwent a nephrostomy tube placement. Over the course of the next 6 weeks interventional radiology as attempted twice to get across the occluded segment of ureter unsuccessfully. Her most recent attempt on 11/19/15 nephrostomy tube was exchanged. She presents today for further evaluation and management options. The patient has tolerated the nephrostomy tube. She has had not had any issues with, flank pain, fevers or chills.     Patient has a history of breast cancer and is status post bilateral vasectomy in 2012. She also has a history of hypothyroidism. She is otherwise healthy.   Past Medical History Problems  1. History of breast cancer (Z85.3) 2. History of hypothyroidism (Z86.39)  Surgical History Problems  1. History of Cystoscopy With Ureteroscopy Left 2. History of Ovarian Surgery  Current Meds 1. Cytomel 5 MCG Oral Tablet (Liothyronine Sodium);  Therapy: (Recorded:21Feb2017) to Recorded 2. Frovatriptan Succinate 2.5 MG Oral Tablet;  Therapy: (Recorded:21Feb2017) to Recorded 3. Synthroid 75 MCG Oral Tablet (Levothyroxine Sodium);  Therapy: (Recorded:21Feb2017) to Recorded  Allergies Medication  1. Penicillins 2. Dilaudid TABS 3. Erythromycin TABS 4. Reglan TABS  Family History Problems   1. Family history of Death of family member : Mother, Father   Mother at age 84Father at age 38 2. Family history of malignant neoplasm of breast (Z80.3) : Mother  Social History Problems  1. Denied: History of Alcohol use 2. Denied: History of Caffeine use 3. Former smoker [phone])   1/2 for 5 years and quit 40 years ago 80. Married 5. Number of children   2 daughters 6. Retired   Designer, fashion/clothing Vital Signs [Data Includes: Last 1 Day]  Recorded: 07Apr2017 02:02PM  Blood Pressure: 106 / 70 Heart Rate: 67  Physical Exam Constitutional: Well nourished and well developed . No acute distress.  Pulmonary: No respiratory distress and normal respiratory rhythm and effort.  Cardiovascular: Heart rate and rhythm are normal . No peripheral edema.  Abdomen: The abdomen is flat. The abdomen is soft and nontender. No suprapubic tenderness. No CVA tenderness. Left nephrostomy tube was dressed, clean, dry, intact.  Genitourinary:  The bladder is normal on palpation.    Results/Data Urine [Data Includes: Last 1 Day]   07Apr2017  COLOR STRAW   APPEARANCE CLEAR   SPECIFIC GRAVITY <1.005   pH 5.5   GLUCOSE NEGATIVE   BILIRUBIN NEGATIVE   KETONE NEGATIVE   BLOOD NEGATIVE   PROTEIN NEGATIVE   NITRITE NEGATIVE   LEUKOCYTE ESTERASE NEGATIVE    Normal urine analysis  I have independently reviewed the patient's recent antegrade nephrostogram demonstrating an abrupt end to the left ureter approximately 4 cm from the right UVJ.   Assessment Assessed  1. Hydronephrosis of right kidney (N13.30)  Plan Health Maintenance  1. UA With REFLEX; [Do Not Release]; Status:Complete;   Done: 07Apr2017 01:36PM Hydronephrosis of right kidney  2. Follow-up Month x 1  Office  Follow-up  Status: Hold For - Appointment,Date of Service   Requested for: 07Apr2017 3. Follow-up Schedule Surgery Office  Follow-up  Status: Hold For - Appointment   Requested for: 07Apr2017  Discussion/Summary   Unfortunately, the patient has a occluded left 1  distal ureter which has been tied off by suture and over the last 6 weeks we have been unsuccessful at opening up the stricture or occluded area. At this point we discussed moving forward with a left distal ureteral reimplant. We discussed the various approaches including an open reimplant versus a robotic-assisted laparoscopic reimplant.   Because of 1  the anticipated adhesions, I felt the best thing would be best to proceed with an open right reimplant,  through 1  the patient's Pfannenstiel incision. I   then 1  described the operation in some detail. The patient may need a psoas hitch and less likely a Boari flap. We will plan to transect the ureter in the area of occlusion and reimplant it into the bladder dome. Following the operation the patient will have an indwelling stent for 2-4 weeks. She will need a catheter following the operation for a week. I described to the patient the expected hospital course and recovery time. The patient understands that she we will leave a drain around the anastomosis and hopefully remove it prior to discharge. I explained to the patient the risks of the operation which predominantly include bleeding, infection, damage to surrounding structures including colon and small intestine as well as the large vessels and nerves in the pelvis. I also explained the risk of stenosis of the reimplanted ureter.

## 2016-01-07 ENCOUNTER — Inpatient Hospital Stay (HOSPITAL_COMMUNITY)
Admission: RE | Admit: 2016-01-07 | Discharge: 2016-01-08 | DRG: 660 | Disposition: A | Discharge status: Home / Self Care | Payer: BLUE CROSS/BLUE SHIELD | Source: Ambulatory Visit | Attending: Urology | Admitting: Urology

## 2016-01-07 ENCOUNTER — Encounter (HOSPITAL_COMMUNITY): Payer: Self-pay | Admitting: *Deleted

## 2016-01-07 ENCOUNTER — Inpatient Hospital Stay (HOSPITAL_COMMUNITY): Payer: BLUE CROSS/BLUE SHIELD | Admitting: Certified Registered Nurse Anesthetist

## 2016-01-07 ENCOUNTER — Encounter (HOSPITAL_COMMUNITY)
Admission: RE | Disposition: A | Discharge status: Home / Self Care | Payer: Self-pay | Source: Ambulatory Visit | Attending: Urology

## 2016-01-07 DIAGNOSIS — N135 Crossing vessel and stricture of ureter without hydronephrosis: Principal | ICD-10-CM | POA: Diagnosis present

## 2016-01-07 DIAGNOSIS — Z87891 Personal history of nicotine dependence: Secondary | ICD-10-CM

## 2016-01-07 DIAGNOSIS — N1330 Unspecified hydronephrosis: Secondary | ICD-10-CM | POA: Diagnosis present

## 2016-01-07 DIAGNOSIS — Z79899 Other long term (current) drug therapy: Secondary | ICD-10-CM | POA: Diagnosis not present

## 2016-01-07 DIAGNOSIS — Z803 Family history of malignant neoplasm of breast: Secondary | ICD-10-CM

## 2016-01-07 DIAGNOSIS — Z853 Personal history of malignant neoplasm of breast: Secondary | ICD-10-CM | POA: Diagnosis not present

## 2016-01-07 DIAGNOSIS — N736 Female pelvic peritoneal adhesions (postinfective): Secondary | ICD-10-CM | POA: Diagnosis present

## 2016-01-07 DIAGNOSIS — E039 Hypothyroidism, unspecified: Secondary | ICD-10-CM | POA: Diagnosis present

## 2016-01-07 HISTORY — PX: URETERAL REIMPLANTION: SHX2611

## 2016-01-07 LAB — BASIC METABOLIC PANEL
ANION GAP: 6 (ref 5–15)
BUN: 8 mg/dL (ref 6–20)
CALCIUM: 8.1 mg/dL — AB (ref 8.9–10.3)
CO2: 25 mmol/L (ref 22–32)
Chloride: 104 mmol/L (ref 101–111)
Creatinine, Ser: 0.47 mg/dL (ref 0.44–1.00)
Glucose, Bld: 190 mg/dL — ABNORMAL HIGH (ref 65–99)
POTASSIUM: 3.5 mmol/L (ref 3.5–5.1)
SODIUM: 135 mmol/L (ref 135–145)

## 2016-01-07 LAB — TYPE AND SCREEN
ABO/RH(D): B POS
ANTIBODY SCREEN: NEGATIVE

## 2016-01-07 LAB — CBC
HCT: 29.4 % — ABNORMAL LOW (ref 36.0–46.0)
Hemoglobin: 9.8 g/dL — ABNORMAL LOW (ref 12.0–15.0)
MCH: 29.2 pg (ref 26.0–34.0)
MCHC: 33.3 g/dL (ref 30.0–36.0)
MCV: 87.5 fL (ref 78.0–100.0)
PLATELETS: 193 10*3/uL (ref 150–400)
RBC: 3.36 MIL/uL — AB (ref 3.87–5.11)
RDW: 12.2 % (ref 11.5–15.5)
WBC: 9.5 10*3/uL (ref 4.0–10.5)

## 2016-01-07 SURGERY — REIMPLANTATION, URETER
Anesthesia: General | Laterality: Left

## 2016-01-07 MED ORDER — MIDAZOLAM HCL 2 MG/2ML IJ SOLN
INTRAMUSCULAR | Status: AC
  Filled 2016-01-07: qty 2

## 2016-01-07 MED ORDER — DIPHENHYDRAMINE HCL 12.5 MG/5ML PO ELIX: 12.5000 mg | ORAL_SOLUTION | Freq: Four times a day (QID) | ORAL | Status: DC | PRN

## 2016-01-07 MED ORDER — PHENYLEPHRINE HCL 10 MG/ML IJ SOLN
INTRAMUSCULAR | Status: DC | PRN
Start: 2016-01-07 — End: 2016-01-07
  Administered 2016-01-07 (×5): 80 ug via INTRAVENOUS

## 2016-01-07 MED ORDER — PROPOFOL 10 MG/ML IV BOLUS
INTRAVENOUS | Status: AC
  Filled 2016-01-07: qty 20

## 2016-01-07 MED ORDER — FENTANYL CITRATE (PF) 100 MCG/2ML IJ SOLN
INTRAMUSCULAR | Status: AC
  Filled 2016-01-07: qty 2

## 2016-01-07 MED ORDER — PROPOFOL 10 MG/ML IV BOLUS
INTRAVENOUS | Status: DC | PRN
  Administered 2016-01-07: 150 mg via INTRAVENOUS

## 2016-01-07 MED ORDER — LEVOTHYROXINE SODIUM 75 MCG PO TABS
75.0000 ug | ORAL_TABLET | Freq: Every day | ORAL | Status: DC
  Administered 2016-01-08: 75 ug via ORAL
  Filled 2016-01-07: qty 1

## 2016-01-07 MED ORDER — BELLADONNA ALKALOIDS-OPIUM 16.2-60 MG RE SUPP: 1.0000 | Freq: Four times a day (QID) | RECTAL | Status: DC | PRN

## 2016-01-07 MED ORDER — EPHEDRINE SULFATE 50 MG/ML IJ SOLN
INTRAMUSCULAR | Status: DC | PRN
  Administered 2016-01-07 (×3): 10 mg via INTRAVENOUS

## 2016-01-07 MED ORDER — BUPIVACAINE-EPINEPHRINE 0.25% -1:200000 IJ SOLN
INTRAMUSCULAR | Status: DC | PRN
  Administered 2016-01-07: 6.5 mL

## 2016-01-07 MED ORDER — CIPROFLOXACIN IN D5W 400 MG/200ML IV SOLN
INTRAVENOUS | Status: AC
  Filled 2016-01-07: qty 200

## 2016-01-07 MED ORDER — SODIUM CHLORIDE 0.9% FLUSH: 3.0000 mL | INTRAVENOUS | Status: DC | PRN

## 2016-01-07 MED ORDER — FENTANYL CITRATE (PF) 100 MCG/2ML IJ SOLN
INTRAMUSCULAR | Status: DC | PRN
  Administered 2016-01-07 (×2): 50 ug via INTRAVENOUS
  Administered 2016-01-07: 100 ug via INTRAVENOUS
  Administered 2016-01-07 (×4): 50 ug via INTRAVENOUS

## 2016-01-07 MED ORDER — ONDANSETRON HCL 4 MG/2ML IJ SOLN
INTRAMUSCULAR | Status: DC | PRN
  Administered 2016-01-07: 4 mg via INTRAVENOUS
  Administered 2016-01-07: .4 mg via INTRAVENOUS

## 2016-01-07 MED ORDER — OXYBUTYNIN CHLORIDE ER 10 MG PO TB24
10.0000 mg | ORAL_TABLET | Freq: Every day | ORAL | Status: DC
  Administered 2016-01-07: 10 mg via ORAL
  Filled 2016-01-07 (×2): qty 1

## 2016-01-07 MED ORDER — KETOROLAC TROMETHAMINE 30 MG/ML IJ SOLN
INTRAMUSCULAR | Status: DC | PRN
  Administered 2016-01-07: 30 mg via INTRAVENOUS

## 2016-01-07 MED ORDER — SODIUM CHLORIDE 0.9 % IJ SOLN
INTRAMUSCULAR | Status: AC
  Filled 2016-01-07: qty 20

## 2016-01-07 MED ORDER — SODIUM CHLORIDE 0.9% FLUSH
3.0000 mL | Freq: Two times a day (BID) | INTRAVENOUS | Status: DC
  Administered 2016-01-08: 3 mL via INTRAVENOUS

## 2016-01-07 MED ORDER — MORPHINE SULFATE (PF) 2 MG/ML IV SOLN
2.0000 mg | INTRAVENOUS | Status: DC | PRN
  Administered 2016-01-07 (×2): 4 mg via INTRAVENOUS
  Filled 2016-01-07 (×2): qty 2

## 2016-01-07 MED ORDER — NEOSTIGMINE METHYLSULFATE 10 MG/10ML IV SOLN
INTRAVENOUS | Status: DC | PRN
  Administered 2016-01-07: 3 mg via INTRAVENOUS

## 2016-01-07 MED ORDER — MAGNESIUM CITRATE PO SOLN
1.0000 | Freq: Once | ORAL | Status: DC
  Filled 2016-01-07: qty 296

## 2016-01-07 MED ORDER — LIDOCAINE 5 % EX PTCH
1.0000 | MEDICATED_PATCH | CUTANEOUS | Status: DC
  Administered 2016-01-07: 1 via TRANSDERMAL
  Filled 2016-01-07 (×2): qty 1

## 2016-01-07 MED ORDER — SODIUM CHLORIDE 0.9 % IV SOLN: 250.0000 mL | INTRAVENOUS | Status: DC | PRN

## 2016-01-07 MED ORDER — PHENYLEPHRINE 40 MCG/ML (10ML) SYRINGE FOR IV PUSH (FOR BLOOD PRESSURE SUPPORT)
PREFILLED_SYRINGE | INTRAVENOUS | Status: AC
  Filled 2016-01-07: qty 10

## 2016-01-07 MED ORDER — CLINDAMYCIN PHOSPHATE 900 MG/50ML IV SOLN
INTRAVENOUS | Status: AC
  Filled 2016-01-07: qty 50

## 2016-01-07 MED ORDER — ONDANSETRON HCL 4 MG/2ML IJ SOLN: 4.0000 mg | INTRAMUSCULAR | Status: DC | PRN

## 2016-01-07 MED ORDER — FENTANYL CITRATE (PF) 100 MCG/2ML IJ SOLN
25.0000 ug | INTRAMUSCULAR | Status: DC | PRN
  Administered 2016-01-07: 50 ug via INTRAVENOUS
  Administered 2016-01-07: 25 ug via INTRAVENOUS

## 2016-01-07 MED ORDER — POLYETHYLENE GLYCOL 3350 17 G PO PACK: 17.0000 g | PACK | Freq: Every day | ORAL | Status: DC | PRN

## 2016-01-07 MED ORDER — ROCURONIUM BROMIDE 100 MG/10ML IV SOLN
INTRAVENOUS | Status: DC | PRN
  Administered 2016-01-07: 20 mg via INTRAVENOUS
  Administered 2016-01-07: 50 mg via INTRAVENOUS
  Administered 2016-01-07: 20 mg via INTRAVENOUS
  Administered 2016-01-07 (×2): 10 mg via INTRAVENOUS

## 2016-01-07 MED ORDER — LIDOCAINE HCL (CARDIAC) 20 MG/ML IV SOLN
INTRAVENOUS | Status: DC | PRN
  Administered 2016-01-07: 60 mg via INTRAVENOUS

## 2016-01-07 MED ORDER — PHENYLEPHRINE HCL 10 MG/ML IJ SOLN
10.0000 mg | INTRAVENOUS | Status: DC | PRN
  Administered 2016-01-07: 5 ug/min via INTRAVENOUS

## 2016-01-07 MED ORDER — ACETAMINOPHEN 10 MG/ML IV SOLN
INTRAVENOUS | Status: AC
  Filled 2016-01-07: qty 100

## 2016-01-07 MED ORDER — DIPHENHYDRAMINE HCL 50 MG/ML IJ SOLN: 12.5000 mg | Freq: Four times a day (QID) | INTRAMUSCULAR | Status: DC | PRN

## 2016-01-07 MED ORDER — MIDAZOLAM HCL 5 MG/5ML IJ SOLN
INTRAMUSCULAR | Status: DC | PRN
  Administered 2016-01-07: 2 mg via INTRAVENOUS

## 2016-01-07 MED ORDER — LACTATED RINGERS IV SOLN
INTRAVENOUS | Status: DC | PRN
  Administered 2016-01-07 (×3): via INTRAVENOUS

## 2016-01-07 MED ORDER — OXYCODONE HCL 5 MG PO TABS: 5.0000 mg | ORAL_TABLET | ORAL | Status: DC | PRN

## 2016-01-07 MED ORDER — FENTANYL CITRATE (PF) 250 MCG/5ML IJ SOLN
INTRAMUSCULAR | Status: AC
  Filled 2016-01-07: qty 5

## 2016-01-07 MED ORDER — SENNOSIDES-DOCUSATE SODIUM 8.6-50 MG PO TABS
2.0000 | ORAL_TABLET | Freq: Every day | ORAL | Status: DC
  Administered 2016-01-07: 2 via ORAL
  Filled 2016-01-07: qty 2

## 2016-01-07 MED ORDER — KETOROLAC TROMETHAMINE 15 MG/ML IJ SOLN
15.0000 mg | Freq: Four times a day (QID) | INTRAMUSCULAR | Status: DC
  Administered 2016-01-07 – 2016-01-08 (×3): 15 mg via INTRAVENOUS
  Filled 2016-01-07 (×3): qty 1

## 2016-01-07 MED ORDER — FLEET ENEMA 7-19 GM/118ML RE ENEM: 1.0000 | ENEMA | Freq: Once | RECTAL | Status: DC

## 2016-01-07 MED ORDER — CLINDAMYCIN PHOSPHATE 900 MG/50ML IV SOLN
900.0000 mg | INTRAVENOUS | Status: AC
  Administered 2016-01-07: 900 mg via INTRAVENOUS

## 2016-01-07 MED ORDER — BUPIVACAINE-EPINEPHRINE (PF) 0.25% -1:200000 IJ SOLN
INTRAMUSCULAR | Status: AC
  Filled 2016-01-07: qty 30

## 2016-01-07 MED ORDER — ACETAMINOPHEN 10 MG/ML IV SOLN
INTRAVENOUS | Status: DC | PRN
Start: 2016-01-07 — End: 2016-01-07
  Administered 2016-01-07: 1000 mg via INTRAVENOUS

## 2016-01-07 MED ORDER — ACETAMINOPHEN 500 MG PO TABS: 1000.0000 mg | ORAL_TABLET | Freq: Four times a day (QID) | ORAL | Status: DC

## 2016-01-07 MED ORDER — ACETAMINOPHEN 10 MG/ML IV SOLN
1000.0000 mg | Freq: Four times a day (QID) | INTRAVENOUS | Status: DC
  Administered 2016-01-07 – 2016-01-08 (×2): 1000 mg via INTRAVENOUS
  Filled 2016-01-07 (×4): qty 100

## 2016-01-07 MED ORDER — LIOTHYRONINE SODIUM 5 MCG PO TABS
5.0000 ug | ORAL_TABLET | Freq: Every day | ORAL | Status: DC
  Administered 2016-01-08: 5 ug via ORAL
  Filled 2016-01-07: qty 1

## 2016-01-07 MED ORDER — GABAPENTIN 300 MG PO CAPS: 300.0000 mg | ORAL_CAPSULE | Freq: Every evening | ORAL | Status: DC | PRN

## 2016-01-07 MED ORDER — DEXTROSE IN LACTATED RINGERS 5 % IV SOLN
INTRAVENOUS | Status: DC
  Administered 2016-01-07 (×2): via INTRAVENOUS

## 2016-01-07 MED ORDER — BUPIVACAINE LIPOSOME 1.3 % IJ SUSP
20.0000 mL | Freq: Once | INTRAMUSCULAR | Status: AC
  Administered 2016-01-07: 20 mL
  Filled 2016-01-07: qty 20

## 2016-01-07 SURGICAL SUPPLY — 68 items
BAG URINE DRAINAGE (UROLOGICAL SUPPLIES) ×3 IMPLANT
BLADE EXTENDED COATED 6.5IN (ELECTRODE) ×3 IMPLANT
BLADE HEX COATED 2.75 (ELECTRODE) ×3 IMPLANT
CATH FOLEY 2WAY SLVR  5CC 18FR (CATHETERS) ×2
CATH FOLEY 2WAY SLVR 5CC 18FR (CATHETERS) ×1 IMPLANT
CHLORAPREP W/TINT 26ML (MISCELLANEOUS) ×3 IMPLANT
CLIP LIGATING HEM O LOK PURPLE (MISCELLANEOUS) ×6 IMPLANT
CLIP LIGATING HEMO O LOK GREEN (MISCELLANEOUS) ×6 IMPLANT
CLIP TI LARGE 6 (CLIP) ×2 IMPLANT
CLIP TI MEDIUM 6 (CLIP) ×2 IMPLANT
CLOSURE WOUND 1/2 X4 (GAUZE/BANDAGES/DRESSINGS)
COVER SURGICAL LIGHT HANDLE (MISCELLANEOUS) ×2 IMPLANT
DECANTER SPIKE VIAL GLASS SM (MISCELLANEOUS) ×2 IMPLANT
DISSECTOR ROUND CHERRY 3/8 STR (MISCELLANEOUS) ×3 IMPLANT
DRAIN CHANNEL 10F 3/8 F FF (DRAIN) ×3 IMPLANT
DRAIN CHANNEL 19F RND (DRAIN) ×2 IMPLANT
DRAPE LAPAROTOMY TRNSV 102X78 (DRAPE) ×3 IMPLANT
DRAPE WARM FLUID 44X44 (DRAPE) ×3 IMPLANT
DRSG TEGADERM 4X4.75 (GAUZE/BANDAGES/DRESSINGS) ×2 IMPLANT
ELECT REM PT RETURN 9FT ADLT (ELECTROSURGICAL) ×3
ELECTRODE REM PT RTRN 9FT ADLT (ELECTROSURGICAL) ×1 IMPLANT
EVACUATOR SILICONE 100CC (DRAIN) ×3 IMPLANT
GAUZE SPONGE 4X4 12PLY STRL (GAUZE/BANDAGES/DRESSINGS) ×3 IMPLANT
GAUZE SPONGE 4X4 16PLY XRAY LF (GAUZE/BANDAGES/DRESSINGS) ×3 IMPLANT
GLOVE BIOGEL M STRL SZ7.5 (GLOVE) ×3 IMPLANT
GOWN STRL REUS W/TWL LRG LVL3 (GOWN DISPOSABLE) ×3 IMPLANT
GOWN STRL REUS W/TWL XL LVL3 (GOWN DISPOSABLE) ×3 IMPLANT
GUIDEWIRE STR DUAL SENSOR (WIRE) ×3 IMPLANT
KIT BASIN OR (CUSTOM PROCEDURE TRAY) ×3 IMPLANT
LIQUID BAND (GAUZE/BANDAGES/DRESSINGS) ×2 IMPLANT
LOOP VESSEL MAXI BLUE (MISCELLANEOUS) ×2 IMPLANT
NEEDLE HYPO 22GX1.5 SAFETY (NEEDLE) ×2 IMPLANT
NS IRRIG 1000ML POUR BTL (IV SOLUTION) ×6 IMPLANT
PACK GENERAL/GYN (CUSTOM PROCEDURE TRAY) ×3 IMPLANT
PLUG CATH AND CAP STER (CATHETERS) ×3 IMPLANT
SET IRRIG Y TYPE TUR BLADDER L (SET/KITS/TRAYS/PACK) ×1 IMPLANT
SPONGE LAP 18X18 X RAY DECT (DISPOSABLE) ×5 IMPLANT
SPONGE LAP 4X18 X RAY DECT (DISPOSABLE) IMPLANT
STAPLER VISISTAT 35W (STAPLE) IMPLANT
STENT URET 6FRX24 CONTOUR (STENTS) ×2 IMPLANT
STRIP CLOSURE SKIN 1/2X4 (GAUZE/BANDAGES/DRESSINGS) IMPLANT
SUCTION FRAZIER HANDLE 10FR (MISCELLANEOUS)
SUCTION TUBE FRAZIER 10FR DISP (MISCELLANEOUS) IMPLANT
SUT CHROMIC 0 UR 5 27 (SUTURE) IMPLANT
SUT CHROMIC 2 0 UR 5 27 (SUTURE) IMPLANT
SUT CHROMIC 4 0 SH 27 (SUTURE) ×4 IMPLANT
SUT ETHILON 4 0 PS 2 18 (SUTURE) ×2 IMPLANT
SUT MNCRL AB 4-0 PS2 18 (SUTURE) ×2 IMPLANT
SUT PDS AB 1 CTX 36 (SUTURE) ×6 IMPLANT
SUT PDS AB 1 TP1 96 (SUTURE) ×4 IMPLANT
SUT SILK 3 0 SH 30 (SUTURE) ×2 IMPLANT
SUT VIC AB 0 CT1 27 (SUTURE)
SUT VIC AB 0 CT1 27XBRD ANTBC (SUTURE) IMPLANT
SUT VIC AB 2-0 CT2 27 (SUTURE) IMPLANT
SUT VIC AB 2-0 SH 27 (SUTURE) ×9
SUT VIC AB 2-0 SH 27X BRD (SUTURE) IMPLANT
SUT VIC AB 3-0 SH 18 (SUTURE) ×2 IMPLANT
SUT VIC AB 3-0 SH 27 (SUTURE) ×3
SUT VIC AB 3-0 SH 27XBRD (SUTURE) IMPLANT
SUT VIC AB 4-0 RB1 27 (SUTURE) ×12
SUT VIC AB 4-0 RB1 27XBRD (SUTURE) IMPLANT
SUT VIC AB 4-0 SH 18 (SUTURE) IMPLANT
SYR CONTROL 10ML LL (SYRINGE) ×2 IMPLANT
SYRINGE 10CC LL (SYRINGE) ×3 IMPLANT
TOWEL BLUE STERILE X RAY DET (MISCELLANEOUS) ×1 IMPLANT
TOWEL OR 17X26 10 PK STRL BLUE (TOWEL DISPOSABLE) ×3 IMPLANT
TUBE FEEDING 5FR 36IN KANGAROO (TUBING) IMPLANT
WATER STERILE IRR 1500ML POUR (IV SOLUTION) ×1 IMPLANT

## 2016-01-07 NOTE — Anesthesia Postprocedure Evaluation (Signed)
Anesthesia Post Note  Patient: Beth Davis  Procedure(s) Performed: Procedure(s) (LRB): OPEN LEFT URETERAL REIMPLANT  (Left)  Patient location during evaluation: PACU Anesthesia Type: General Level of consciousness: awake Pain management: pain level controlled Vital Signs Assessment: post-procedure vital signs reviewed and stable Respiratory status: spontaneous breathing Cardiovascular status: stable Anesthetic complications: no    Last Vitals:  Filed Vitals:   01/07/16 1515 01/07/16 1528  BP:  104/51  Pulse: 66 74  Temp:  36.4 C  Resp: 14 16    Last Pain:  Filed Vitals:   01/07/16 1605  PainSc: 5                  EDWARDS,Jahayra Mazo

## 2016-01-07 NOTE — Progress Notes (Signed)
Day of Surgery Subjective: Patient reports tolerating PO and pain control good. She is very happy to be done with surgery and that her NT was removed intra-op. No N/V or other complaints.   Objective: Vital signs in last 24 hours: Temp:  [97.5 F (36.4 C)-98 F (36.7 C)] 97.6 F (36.4 C) (05/25 1528) Pulse Rate:  [65-78] 74 (05/25 1528) Resp:  [12-16] 16 (05/25 1528) BP: (102-118)/(51-70) 104/51 mmHg (05/25 1528) SpO2:  [100 %] 100 % (05/25 1528) Weight:  [48.716 kg (107 lb 6.4 oz)] 48.716 kg (107 lb 6.4 oz) (05/25 0552)  Intake/Output from previous day:   Intake/Output this shift: Total I/O In: 2900 [I.V.:2400; IV Piggyback:500] Out: 650 [Urine:560; Drains:40; Blood:50]  Physical Exam:  General:alert, cooperative and appears stated age GI: Soft, NT, ND. Incision without erythema, induration or exudate. JP drain with SS output.   Female genitalia: Foley catheter with clear yellow urine in bag Resp: No increased WOB on  Extremities: extremities normal, atraumatic, no cyanosis or edema  Lab Results:  Recent Labs  01/07/16 1413  HGB 9.8*  HCT 29.4*   BMET  Recent Labs  01/07/16 1413  NA 135  K 3.5  CL 104  CO2 25  GLUCOSE 190*  BUN 8  CREATININE 0.47  CALCIUM 8.1*   No results for input(s): LABPT, INR in the last 72 hours. No results for input(s): LABURIN in the last 72 hours. No results found for this or any previous visit.  Studies/Results: No results found.  Assessment/Plan: Day of Surgery Procedure(s) (LRB): OPEN LEFT URETERAL REIMPLANT  (Left)    Clears, ADAT  mIVF  Ambulate  Labs tomorrow  Foley to drainage - will d/c home with this  Indwelling ureteral stent  F/u JP output  PRN morphine, oxycodone  Scheduled IV Tylenol  PRN toradol  Add lidocaine patches  Possible D/C tomorrow or next day   LOS: 0 days   Acie Fredrickson 01/07/2016, 4:51 PM

## 2016-01-07 NOTE — Anesthesia Procedure Notes (Signed)
Procedure Name: Intubation Date/Time: 01/07/2016 7:33 AM Performed by: Dimas Millin, Tobby Fawcett F Pre-anesthesia Checklist: Patient identified, Emergency Drugs available, Suction available, Patient being monitored and Timeout performed Patient Re-evaluated:Patient Re-evaluated prior to inductionOxygen Delivery Method: Circle system utilized Preoxygenation: Pre-oxygenation with 100% oxygen Intubation Type: IV induction Ventilation: Mask ventilation without difficulty Laryngoscope Size: Miller and 2 Grade View: Grade I Tube type: Oral Tube size: 7.0 mm Number of attempts: 1 Airway Equipment and Method: Stylet Placement Confirmation: ETT inserted through vocal cords under direct vision,  positive ETCO2 and breath sounds checked- equal and bilateral Secured at: 24 cm Tube secured with: Tape Dental Injury: Teeth and Oropharynx as per pre-operative assessment

## 2016-01-07 NOTE — Transfer of Care (Signed)
Immediate Anesthesia Transfer of Care Note  Patient: Beth Davis  Procedure(s) Performed: Procedure(s): OPEN LEFT URETERAL REIMPLANT  (Left)  Patient Location: PACU  Anesthesia Type:General  Level of Consciousness: awake, oriented, patient cooperative, lethargic and responds to stimulation  Airway & Oxygen Therapy: Patient Spontanous Breathing and Patient connected to face mask oxygen  Post-op Assessment: Report given to RN, Post -op Vital signs reviewed and stable and Patient moving all extremities  Post vital signs: Reviewed and stable  Last Vitals:  Filed Vitals:   01/07/16 0531  BP: 115/70  Pulse: 78  Temp: 36.7 C  Resp: 16    Last Pain: There were no vitals filed for this visit.    Patients Stated Pain Goal: 4 ([id number])  Complications: No apparent anesthesia complications

## 2016-01-07 NOTE — Op Note (Signed)
Preoperative Diagnosis: Left ureteral obstruction.  Postoperative Diagnosis:  same  Procedure(s) Performed:  1. Exploratory laparotomy 2. Lysis of adhesions, 4 hours. 3. Left ureteral reimplant 4. Left ureteral stent placement  Teaching Surgeon:  Louis Meckel, MD  Resident Surgeon:  E. Harvel Ricks, MD  Assistant(s):  None  Anesthesia:  General via endotracheal tube  Fluids:  See anesthesia record  UOP: unable to obtain  Estimated blood loss:  50 mL  Specimens:  Left distal ureter sent for final pathology  Drains:  18Fr blake drain in pelvis. 20 Fr foley draining clear yellow urine at end of case.  24cm x 50F left double J stent  Complications:  none  Indications: 65 yo female with left distal ureteral obstruction following pelvic surgery presenting for open left ureteral reimplant.   Findings:   1. Significant adhesions in the left paracolic gutter making identification of the ureter very difficult. Over 4 hours were spent in lysis of adhesions and identification of the left ureter which when finally identified had significant adhesions to the surrounding tissues and was scarred in near the left pelvic side wall. The ureter had been completely transected and the end was ligated. There was significant hydronephrosis proximal to this.  2. 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  Description:  The patient was correctly identified in the preop holding area where written informed consent as well potential risk and complication reviewed. They were brought back to the operative suite where a preinduction timeout was performed. Once correct information was verified, general anesthesia was induced via endotracheal tube. SCDs were placed for VTE prophylaxis. The following prophylactic perioperative antibiotics were instilled. A second timeout was then performed.   The patient was placed in gentle frogleg position with all pressure points padded. Abdomen and genitalia were prepped  with chloroprep and  was draped in sterile fashion.   Repeat timeout was performed. Foley catheter was placed sterilely on the table.   Marking pen was used to mark out the scar from her previously pfannenstiel incision.We then used a 15 blade scalpel to incise the skin and then used bovie electrocautery to incise the subcutaneous tissues down to the fascia. We then used a combination of sharp and blunt dissection to open the fascia in the midline. We cleaned off the underside of the fascia bluntly. Staying in the midline we split the two bellies of the rectus muscle and retracted them laterally. There were significant adhesions from her recent surgery. We then opened the transervalis fascia. We then obtained a bookwalter retractor to obtain adequate exposure.   We started extraperitoneal and we dissected down to the bladder using a combination of electrocautery, sharp and blunt dissection. Of note, there was significant adhesions within the entire space of retzius and we carefully dissected the peritoneum and its contents off the back side of the bladder. Next, we dropped the bladder off the anterior abdominal wall and mobilized the bladder bilaterally in the standard fashion. Of note, there was a small cystotomy at the dome of the bladder which was tagged to be closed later in the procedure. We then examined the left lateral peri-vesical and peri-colonic space in attempts to identify the ureter. There were significant adhesions which required careful lysis of adhesions. This was performed for nearly 3 hours until we ultimately identified the left distal ureter which was densely adherent to its surrounding tissues and was scarred to the lateral pelvic sidewall. Of note, after dissecting the ureter free and examining it had previously been ligated and complete transected  with an obliterated lumen. Proximal to this the ureter was hydronephrotic but normal in appearance with healthy appearing mucosa.  Once we  had identified the distal ureter we circumferentially freed up the ureter all the way down up to the mid to proximal ureter with a combination of blunt dissection and electrocautery.  Next, we sharply transected the ureter with potts scissors. Initially the lumen was obliterated so we moved 1 cm more proximally and transected the ureter again. This had efflux of urine and a patent lumen with normal appearing mucosa. We sent the distal piece of ureter for final pathology. We then tagged the ureter with a 3-0 chromic stay stitch.   Next we filled the bladder to gravity and laid our ureter over over the left bladder dome. This sat nicely with no tension and we determined that we did not need to take down the right bladder pedicle or perform a psoas hitch. We determined the site of our cystotomy and reimplant and defatted the bladder in this area. Next we opened the bladder ~2 cm with bovie electrocautery. We then spatulated our distal ureter medially and laid the ureter over the cystotomy. We noted excellent length on the bladder and a tension free anastomosis without a psoas hitch.   We placed a single interrupted stitch with 4-0 vicryl at the apex and then ran each side of the anastomosis with 4-0 vicryl three quarters of the way to the top. Next, we passed a a 52F x 24 cm JJ ureteral stent and wire into the ureter through the remaining opening in the anastomosis. This passed easily into the renal pelvis without resistance. We then removed her nephrostomy tube. We then removed the wire and placed the distal curl of the stent into the bladder. Finally, ran the remainder of both anastomotic sutures. We then covered the anastomosis with overlying bladder fat and tissue. There was no evidence of a bladder leak or a leak at our anastomosis.  Next we turned to the cystotomy we had made in the midline dome from prior. We then closed the cystotomy in a two layer running fashion. We used a 2-0 vicryl to approximate the  first mucosal layer of the cystotomy closure in a running fashion and then the detrusor muscle was then closed with a running 2-0 vicryl in an imbricating fashion.  A JP drain was placed in the pelvis through a right lower quadrant stab incision.  There was adequate hemostasis noted.   The fashion was closed in a running fashion with two 0-0 PDS sutures. Subcutaneous tissue was closed with interrupted vicryls. Skin was closed in a subcuticular fashion with running 4-0 monocryl.  All laps, suture counts, and instrument counts were correct 2. Anesthesia was reversed and the patient was taken the recovery area in stable condition.   Post Op Plan:   1. Admit to urology.

## 2016-01-07 NOTE — Anesthesia Preprocedure Evaluation (Signed)
Anesthesia Evaluation  Patient identified by MRN, date of birth, ID band Patient awake    Airway Mallampati: II  TM Distance: >3 FB Neck ROM: Full    Dental   Pulmonary former smoker,    breath sounds clear to auscultation       Cardiovascular negative cardio ROS   Rhythm:Regular Rate:Normal     Neuro/Psych    GI/Hepatic negative GI ROS, Neg liver ROS,   Endo/Other  Hypothyroidism   Renal/GU Renal disease     Musculoskeletal   Abdominal   Peds  Hematology   Anesthesia Other Findings   Reproductive/Obstetrics                             Anesthesia Physical Anesthesia Plan  ASA: III  Anesthesia Plan: General   Post-op Pain Management:    Induction: Intravenous  Airway Management Planned: LMA  Additional Equipment:   Intra-op Plan:   Post-operative Plan: Extubation in OR  Informed Consent: I have reviewed the patients History and Physical, chart, labs and discussed the procedure including the risks, benefits and alternatives for the proposed anesthesia with the patient or authorized representative who has indicated his/her understanding and acceptance.   Dental advisory given  Plan Discussed with: CRNA and Anesthesiologist  Anesthesia Plan Comments:         Anesthesia Quick Evaluation

## 2016-01-07 NOTE — Interval H&P Note (Signed)
History and Physical Interval Note:  01/07/2016 7:12 AM  Beth Davis  has presented today for surgery, with the diagnosis of LEFT URETERAL REIMPLANT STRICTURE  The various methods of treatment have been discussed with the patient and family. After consideration of risks, benefits and other options for treatment, the patient has consented to  Procedure(s): OPEN LEFT URETERAL REIMPLANT  (Left) as a surgical intervention .  The patient's history has been reviewed, patient examined, no change in status, stable for surgery.  I have reviewed the patient's chart and labs.  Questions were answered to the patient's satisfaction.     Louis Meckel W

## 2016-01-08 LAB — BASIC METABOLIC PANEL
Anion gap: 4 — ABNORMAL LOW (ref 5–15)
CO2: 28 mmol/L (ref 22–32)
Calcium: 8.5 mg/dL — ABNORMAL LOW (ref 8.9–10.3)
Chloride: 108 mmol/L (ref 101–111)
Creatinine, Ser: 0.53 mg/dL (ref 0.44–1.00)
GFR calc Af Amer: >60 mL/min (ref >60)
GLUCOSE: 160 mg/dL — AB (ref 65–99)
POTASSIUM: 3.7 mmol/L (ref 3.5–5.1)
Sodium: 140 mmol/L (ref 135–145)

## 2016-01-08 LAB — HEMOGLOBIN AND HEMATOCRIT, BLOOD
HCT: 27 % — ABNORMAL LOW (ref 36.0–46.0)
Hemoglobin: 9 g/dL — ABNORMAL LOW (ref 12.0–15.0)

## 2016-01-08 MED ORDER — SULFAMETHOXAZOLE-TRIMETHOPRIM 800-160 MG PO TABS: 1.0000 | ORAL_TABLET | Freq: Two times a day (BID) | ORAL | Status: AC

## 2016-01-08 MED ORDER — SENNOSIDES-DOCUSATE SODIUM 8.6-50 MG PO TABS: 1.0000 | ORAL_TABLET | Freq: Every day | ORAL | Status: DC

## 2016-01-08 MED ORDER — SENNOSIDES-DOCUSATE SODIUM 8.6-50 MG PO TABS: 1.0000 | ORAL_TABLET | Freq: Every day | ORAL | Status: AC

## 2016-01-08 MED ORDER — TRAMADOL HCL 50 MG PO TABS
50.0000 mg | ORAL_TABLET | Freq: Four times a day (QID) | ORAL | Status: AC | PRN
Start: 2016-01-08 — End: ?

## 2016-01-08 MED ORDER — OXYCODONE-ACETAMINOPHEN 5-325 MG PO TABS: 1.0000 | ORAL_TABLET | ORAL | Status: DC | PRN

## 2016-01-08 NOTE — Discharge Summary (Signed)
Physician Discharge Summary  Patient ID: MARQUISHA SWEEN MRN: [id number] DOB/AGE: 08-20-1950 65 y.o.  Admit date: 01/07/2016 Discharge date: 01/08/2016  Admission Diagnoses: Left ureteral obstruction  Discharge Diagnoses:  Active Problems:   Ureteral obstruction   Discharged Condition: good  Hospital Course:  65 yo female who is s/p left open ureteral reimplant Dr. Louis Meckel. She did well post-operatively. Her diet was slowly advanced and at the time of discharge she was tolerating a regular diet, ambulating at his baseline, catheter was draining well, JP drain was removed, and pain was well controlled with oral narcotics. She was discharged to home on POD#1.  Consults: None  Significant Diagnostic Studies: labs: Creatinine 0.5  Treatments: surgery: left open ureteral reimplant  Discharge Exam: Blood pressure 92/50, pulse 72, temperature 98.7 F (37.1 C), temperature source Oral, resp. rate 16, height 5\' 3"  (1.6 m), weight 48.716 kg (107 lb 6.4 oz), SpO2 100 %. General:alert, cooperative and appears stated age GI: Soft, NT, ND. Incision without erythema, induration or exudate. JP drain out.  Female genitalia: Foley catheter with clear yellow urine in bag Resp: No increased WOB on [place] Extremities: extremities normal, atraumatic, no cyanosis or edema  Disposition: 01-Home or Self Care      Discharge Instructions    Call MD for:  difficulty breathing, headache or visual disturbances    Complete by:  As directed      Call MD for:  extreme fatigue    Complete by:  As directed      Call MD for:  hives    Complete by:  As directed      Call MD for:  persistant dizziness or light-headedness    Complete by:  As directed      Call MD for:  persistant nausea and vomiting    Complete by:  As directed      Call MD for:  redness, tenderness, or signs of infection (pain, swelling, redness, odor or green/yellow discharge around incision site)    Complete by:  As directed      Call MD for:  severe uncontrolled pain    Complete by:  As directed      Call MD for:  temperature >100.4    Complete by:  As directed      Increase activity slowly    Complete by:  As directed             Medication List    STOP taking these medications        aspirin-acetaminophen-caffeine 250-250-65 MG tablet  Commonly known as:  EXCEDRIN MIGRAINE      TAKE these medications        cholecalciferol 1000 units tablet  Commonly known as:  VITAMIN D  Take 1,000 Units by mouth daily.     frovatriptan 2.5 MG tablet  Commonly known as:  FROVA  Take 2.5 mg by mouth as needed for migraine. If recurs, may repeat after 2 hours. Max of 3 tabs in 24 hours.     gabapentin 300 MG capsule  Commonly known as:  NEURONTIN  TAKE 1 CAPSULE (300 MG TOTAL) BY MOUTH AT BEDTIME.     levothyroxine 75 MCG tablet  Commonly known as:  SYNTHROID, LEVOTHROID  Take 75 mcg by mouth daily before breakfast.     liothyronine 5 MCG tablet  Commonly known as:  CYTOMEL  Take 5 mcg by mouth daily.     senna-docusate 8.6-50 MG tablet  Commonly known as:  Senokot-S  Take 1  tablet by mouth daily. While taking narcotics. HOld for diarrhea.     sulfamethoxazole-trimethoprim 800-160 MG tablet  Commonly known as:  BACTRIM DS,SEPTRA DS  Take 1 tablet by mouth 2 (two) times daily. Start 1 day prior to your post-op appointment and foley catheter removal     traMADol 50 MG tablet  Commonly known as:  ULTRAM  Take 1-2 tablets (50-100 mg total) by mouth every 6 (six) hours as needed.       Follow-up Information    Follow up with Ardis Hughs, MD On 01/14/2016.   Specialty:  Urology   Why:  2:15pm   Contact information:   [place] [address] [phone]       Signed: Acie Fredrickson 01/08/2016, 11:16 AM

## 2016-01-08 NOTE — Care Management Note (Signed)
Case Management Note  Patient Details  Name: Beth Davis MRN: [id number] Date of Birth: 27-Jun-1951  Subjective/Objective:                    Action/Plan:d/c home no needs or orders.   Expected Discharge Date:                  Expected Discharge Plan:  Home/Self Care  In-House Referral:     Discharge planning Services  CM Consult  Post Acute Care Choice:    Choice offered to:     DME Arranged:    DME Agency:     HH Arranged:    [place] Agency:     Status of Service:  Completed, signed off  Medicare Important Message Given:    Date Medicare IM Given:    Medicare IM give by:    Date Additional Medicare IM Given:    Additional Medicare Important Message give by:     If discussed at [place] of Stay Meetings, dates discussed:    Additional Comments:  Dessa Phi, RN 01/08/2016, 11:56 AM

## 2016-01-08 NOTE — Progress Notes (Signed)
1 Day Post-Op   Subjective: Patient reports tolerating PO and pain control good. Walking in halls. No N/V, flatus or BMs.   Objective: Vital signs in last 24 hours: Temp:  [97.5 F (36.4 C)-98.7 F (37.1 C)] 98.7 F (37.1 C) (05/26 0525) Pulse Rate:  [65-80] 72 (05/26 0525) Resp:  [12-16] 16 (05/26 0525) BP: (92-118)/(47-68) 92/50 mmHg (05/26 0525) SpO2:  [100 %] 100 % (05/26 0525)  Intake/Output from previous day: 05/25 0701 - 05/26 0700 In: 2900 [I.V.:2400; IV Piggyback:500] Out: 2935 [Urine:2810; Drains:75; Blood:50] Intake/Output this shift: Total I/O In: -  Out: 2035 [Urine:2000; Drains:35]  Physical Exam:  General:alert, cooperative and appears stated age GI: Soft, NT, ND. Incision without erythema, induration or exudate. JP drain with SS output.   Female genitalia: Foley catheter with clear yellow urine in bag Resp: No increased WOB on [place] Extremities: extremities normal, atraumatic, no cyanosis or edema  Lab Results:  Recent Labs  01/07/16 1413 01/08/16 0452  HGB 9.8* 9.0*  HCT 29.4* 27.0*   BMET  Recent Labs  01/07/16 1413 01/08/16 0452  NA 135 140  K 3.5 3.7  CL 104 108  CO2 25 28  GLUCOSE 190* 160*  BUN 8 <5*  CREATININE 0.47 0.53  CALCIUM 8.1* 8.5*   No results for input(s): LABPT, INR in the last 72 hours. No results for input(s): LABURIN in the last 72 hours. No results found for this or any previous visit.  Studies/Results: No results found.  Assessment/Plan: 1 Day Post-Op Procedure(s) (LRB): OPEN LEFT URETERAL REIMPLANT  (Left)    Regular diet  [place]  Ambulate  Labs tomorrow  Foley to drainage - will d/c home with this  Indwelling ureteral stent  F/u JP output  PRN morphine, oxycodone  Scheduled IV Tylenol  PRN toradol  Lidocaine patches  Possible D/C later today or tomorrow   LOS: 1 day   Beth Davis 01/08/2016, 6:59 AM

## 2016-01-08 NOTE — Discharge Instructions (Signed)
1. Activity:  You are encouraged to ambulate frequently (about every hour during waking hours) to help prevent blood clots from forming in your legs or lungs.  However, you should not engage in any heavy lifting (> 10-15 lbs), strenuous activity, or straining. 2. Diet: You should continue a clear liquid diet until passing gas from below.  Once this occurs, you may advance your diet to a soft diet that would be easy to digest (i.e soups, scrambled eggs, mashed potatoes, etc.) for 24 hours just as you would if getting over a bad stomach flu.  If tolerating this diet well for 24 hours, you may then begin eating regular food.  It will be normal to have some amount of bloating, nausea, and abdominal discomfort intermittently. 3. Prescriptions:  You will be provided a prescription for pain medication to take as needed.  If your pain is not severe enough to require the prescription pain medication, you may take extra strength Tylenol instead.  You should also take an over the counter stool softener (Colace 100 mg twice daily) to avoid straining with bowel movements as the pain medication may constipate you. Finally, you will also be provided a prescription for an antibiotic to begin the day prior to your return visit in the office for catheter removal. 4. Catheter care: You will be taught how to take care of the catheter by the nursing staff prior to discharge from the hospital.  You may use both a leg bag and the larger bedside bag but it is recommended to at least use the bigger bedside bag at nighttime as the leg bag is small and will fill up overnight and also does not drain as well when lying flat. You may periodically feel a strong urge to void with the catheter in place.  This is a bladder spasm and most often can occur when having a bowel movement or when you are moving around. It is typically self-limited and usually will stop after a few minutes.   5. Incisions: You may remove your dressing bandages the 2nd  day after surgery.  You most likely will have a few small staples in each of the incisions and once the bandages are removed, the incisions may stay open to air.  You may start showering (not soaking or bathing in water) 48 hours after surgery and the incisions simply need to be patted dry after the shower.  No additional care is needed. 6. What to call us about: You should call the office [phone]) if you develop fever > 101, persistent vomiting, or the catheter stops draining. Also, feel free to call with any other questions you may have and remember the handout that was provided to you as a reference preoperatively which answers many of the common questions that arise after surgery.

## 2016-02-23 ENCOUNTER — Encounter (INDEPENDENT_AMBULATORY_CARE_PROVIDER_SITE_OTHER): Payer: BLUE CROSS/BLUE SHIELD | Admitting: Ophthalmology

## 2016-02-23 DIAGNOSIS — H2513 Age-related nuclear cataract, bilateral: Secondary | ICD-10-CM

## 2016-02-23 DIAGNOSIS — H43813 Vitreous degeneration, bilateral: Secondary | ICD-10-CM | POA: Diagnosis not present

## 2016-12-28 ENCOUNTER — Other Ambulatory Visit: Payer: Self-pay | Admitting: *Deleted

## 2016-12-28 DIAGNOSIS — C50912 Malignant neoplasm of unspecified site of left female breast: Secondary | ICD-10-CM

## 2016-12-29 ENCOUNTER — Other Ambulatory Visit (HOSPITAL_BASED_OUTPATIENT_CLINIC_OR_DEPARTMENT_OTHER): Payer: BLUE CROSS/BLUE SHIELD

## 2016-12-29 ENCOUNTER — Ambulatory Visit (HOSPITAL_BASED_OUTPATIENT_CLINIC_OR_DEPARTMENT_OTHER): Payer: BLUE CROSS/BLUE SHIELD | Admitting: Oncology

## 2016-12-29 DIAGNOSIS — Z170 Estrogen receptor positive status [ER+]: Secondary | ICD-10-CM

## 2016-12-29 DIAGNOSIS — C50912 Malignant neoplasm of unspecified site of left female breast: Secondary | ICD-10-CM

## 2016-12-29 DIAGNOSIS — C50812 Malignant neoplasm of overlapping sites of left female breast: Secondary | ICD-10-CM

## 2016-12-29 DIAGNOSIS — Z853 Personal history of malignant neoplasm of breast: Secondary | ICD-10-CM

## 2016-12-29 NOTE — Progress Notes (Signed)
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   Diagnostic mammography October of 2011 was again unremarkable except for the fact that she does have very dense breasts, and for that reason, she does not just get screening mammography but diagnostic.  This was repeated May 24, 2011, and this time new calcifications were identified posteriorly within the upper outer quadrant of the left breast.  They were mildly pleomorphic, and breast-specific gamma imaging was repeated and showed increased activity posteriorly in the left breast adjacent to the chest wall, correlating with the area of new microcalcifications noted on mammography.   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  The invasive tumor appeared to be intermediate grade and was positive for E-cadherin There was no evidence of angiolymphatic invasion.  A breast prognostic profile on the invasive tumor showed it to be estrogen receptor positive, progesterone receptor and HER2 negative, with an MIB-1 of 25%.   The patient was referred to Dr. [name], and bilateral breast MRIs were obtained May 30, 2011.  There was vague asymmetric enhancement in the lateral central aspect of the left breast measuring up to 8 mm.  There were no other suspicious masses, no axillary or  internal mammary adenopathy, and a 7 mm lesion in the liver Davis felt to be likely a cyst or hemangioma. On 06/14/2011 the patient underwent bilateral mastectomies with results as detailed below.  INTERVAL HISTORY: Beth Davis returns today for follow-up of her estrogen receptor positive breast cancer. The interval history Davis generally unremarkable. She Davis doing "the usual", which means sewing, reading, walking her 68 year old dog who Davis very feisty, and walking without the dog at least a mile a day.   REVIEW OF SYSTEMS: She feels "completely recovered" she feels she doesn't have cancer and Davis not going to have any further cancer problems. A detailed review of systems today was entirely benign  PAST MEDICAL HISTORY: Past Medical History:  Diagnosis Date  . Arthritis   . Breast cancer, IDC, Left, ER +, PR -, Her2 -. dx 06/01/2011--- oncologist-  dr Jana Hakim-- per last note no recurrence   Stage IA (T1b, N0), grade 2---- s/p  left total mastectomy w/ node disssection & right prophylactic total mastectomy 06-14-2011  and chemotherapy complete 10-03-2011 (anti-estrogen therapry ended 09/ 2014)  . Family history of breast cancer   . Family history of melanoma   . History of colitis    COLLAGENOUS  . Hypothyroidism   . Migraine   . Osteoporosis   . Pelvic adhesions   . Pelvic pain in female   . PONV (postoperative nausea and vomiting)   . Wears glasses   migraines  PAST SURGICAL HISTORY: Past Surgical History:  Procedure Laterality Date  . ABDOMINAL HYSTERECTOMY  2006  . BREAST SURGERY  2004   excision left breast fibroadenoma  . CARPAL TUNNEL RELEASE Right 02/05/2013   Procedure: CARPAL TUNNEL RELEASE;  Surgeon:  Cammie Sickle., MD;  Location: [place];  Service: Orthopedics;  Laterality: Right;  . COLONOSCOPY  2012  . CYSTOSCOPY Bilateral 09/28/2015   Procedure: CYSTOSCOPY;  Surgeon: Arvella Nigh, MD;  Location[facility];  Service: Gynecology;   Laterality: Bilateral;  . CYSTOSCOPY W/ URETERAL STENT PLACEMENT Bilateral 10/06/2015   Procedure: CYSTOSCOPY WITH BILATERAL  RETROGRADE PYELOGRAM / LEFT SEMI-RIGID URETEROSCOPY/ LEFT URETERAL EXPLORATION;  Surgeon: Ardis Hughs, MD;  Location: [facility];  Service: Urology;  Laterality: Bilateral;  . LAPAROSCOPIC BILATERAL SALPINGO OOPHERECTOMY Bilateral 09/28/2015   Procedure:  ATTEMPTED LAPAROSCOPIC CONVERTED TO OPEN LAPAROSCOPY WITH BILATERAL SALPINGO OOPHORECTOMY AND  CERVICAL STUMP;  Surgeon: Arvella Nigh, MD;  Location: [place];  Service: Gynecology;  Laterality: Bilateral;  . LAPAROSCOPIC OVARIAN CYSTECTOMY  2003  . LEFT TOTAL MASTECTOMY WITH LYMPH NODE DISSECTION/  RIGHT PROPHYLACTIC MASTECTOMY  06-14-2011  . MASS EXCISION  03/12/2012   Procedure: MINOR EXCISION OF MASS;  Surgeon: Haywood Lasso, MD;  Location: [place];  Service: General;  Laterality: Right;  removal part of port-a-cath scar  . NEGATIVE SLEEP STUDY  2016  per pt  . ORIF FEMUR FRACTURE  1969   and Humerus decompression  . PORT-A-CATH REMOVAL  10/24/2011   Procedure: MINOR REMOVAL PORT-A-CATH;  Surgeon: Haywood Lasso, MD;  Location: [place];  Service: General;  Laterality: Right;  removal of portacath  . PORTACATH PLACEMENT  07/26/2011   Procedure: INSERTION PORT-A-CATH;  Surgeon: Haywood Lasso, MD;  Location: [place];  Service: General;  Laterality: N/A;  port a cath placement  . URETERAL REIMPLANTION Left 01/07/2016   Procedure: OPEN LEFT URETERAL REIMPLANT ;  Surgeon: Ardis Hughs, MD;  Location: [facility];  Service: Urology;  Laterality: Left;    FAMILY HISTORY Family History  Problem Relation Age of Onset  . Breast cancer Mother 3  . Dementia Mother   . Breast cancer Cousin 54       paternal cousin with bilateral breast cancer  . Heart attack Father 61  . Melanoma Father 40  . Melanoma Brother 14  . Heart attack Maternal  Aunt 38  . Parkinson's disease Paternal Aunt   . Heart attack Maternal Grandmother 76  . COPD Maternal Grandfather 75  . Hypertension Paternal Grandmother   . Stroke Paternal Grandmother   . Dementia Maternal Aunt   . Ovarian cancer Other        MGM's mother  The patient's father died at the age of 66.  Her mother Davis alive in her 71s.  She has 3 brothers, no sisters.  The patient's mother did have breast cancer at the age of 14.  A paternal cousin had breast cancer at the age of 58.  The patient has been tested and Davis BRCA 1-2 negative  GYNECOLOGIC HISTORY: Menarche age 7.   Simple hysterectomy 2006.  She Davis GX, P2, 1st pregnancy to term age 14, which of course Davis a risk factor for breast cancer.  She used birth control pills between 9 and 1974 and took Clomid for fertility between [place].  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  Her husband, Beth Davis (goes by Beth Davis), Madia Davis a Chief Executive Officer here in town.  Daughter, Beth Davis,  lives in [place], [place] where she works as a Pharmacist, community.  Daughter, Beth Davis,  Davis a Sport and exercise psychologist here in [place] in Medical sales representative.   ADVANCED DIRECTIVES: in place  HEALTH MAINTENANCE: Social History  Substance Use Topics  . Smoking status: Former Smoker    Packs/day: 0.50    Years: 6.00    Types: Cigarettes    Quit date: 07/21/1975  . Smokeless tobacco: Never Used  . Alcohol use No     Colonoscopy: October 2012  PAP: s/p hysterectomy  Bone density:  April 2016    Allergies  Allergen Reactions  . Ciprofloxacin Other (See Comments)    Redness at IV site - Red Man Syndrome - IV may have been run in to fast causing this reaction  . Dilaudid [Hydromorphone Hcl] Nausea And Vomiting  . Erythromycin Nausea And Vomiting  . Penicillins Hives    Has patient had a PCN reaction causing immediate rash, facial/tongue/throat swelling, SOB or  lightheadedness with hypotension: NO Has patient had a PCN reaction causing severe rash involving mucus membranes or skin necrosis: NO Has patient had a PCN reaction that required hospitalization NO Has patient had a PCN reaction occurring within the last 10 years: NO If all of the above answers are "NO", then may proceed with Cephalosporin use.  . Reglan [Metoclopramide Hcl] Diarrhea  . Vancomycin Other (See Comments)    Redness/itching at IV site - Red Man Syndrome - IV may have been run in to fast causing this reaction     Current Outpatient Prescriptions  Medication Sig Dispense Refill  . cholecalciferol (VITAMIN D) 1000 UNITS tablet Take 1,000 Units by mouth daily.      . frovatriptan (FROVA) 2.5 MG tablet Take 2.5 mg by mouth as needed for migraine. If recurs, may repeat after 2 hours. Max of 3 tabs in 24 hours.    . gabapentin (NEURONTIN) 300 MG capsule TAKE 1 CAPSULE (300 MG TOTAL) BY MOUTH AT BEDTIME. (Patient taking differently: TAKE 1 CAPSULE (300 MG TOTAL) BY MOUTH AT BEDTIME AS NEEDED FOR PAIN.) 90 capsule 4  . levothyroxine (SYNTHROID, LEVOTHROID) 75 MCG tablet Take 75 mcg by mouth daily before breakfast.    . liothyronine (CYTOMEL) 5 MCG tablet Take 5 mcg by mouth daily.    Marland Kitchen senna-docusate (SENOKOT-S) 8.6-50 MG tablet Take 1 tablet by mouth daily. While taking narcotics. HOld for diarrhea. 60 tablet 2  . sulfamethoxazole-trimethoprim (BACTRIM DS,SEPTRA DS) 800-160 MG tablet Take 1 tablet by mouth 2 (two) times daily. Start 1 day prior to your post-op appointment and foley catheter removal 6 tablet 0  . traMADol (ULTRAM) 50 MG tablet Take 1-2 tablets (50-100 mg total) by mouth every 6 (six) hours as needed. 31 tablet 0   No current facility-administered medications for this visit.     OBJECTIVE: Middle-aged white woman In no acute distress  Vitals:   12/29/16 1346  BP: 124/65  Pulse: 66  Resp: 19  Temp: 98.6 F (37 C)     Body mass index Davis 20.23 kg/m.    ECOG FS:  0 Filed Weights   12/29/16 1346  Weight: 114 lb 3.2 oz (51.8 kg)   Sclerae unicteric, pupils round and equal Oropharynx clear and moist No cervical or supraclavicular adenopathy Lungs no rales or rhonchi Heart regular rate and rhythm Abd soft, nontender, positive bowel sounds MSK no focal spinal tenderness, no upper extremity lymphedema Neuro: nonfocal, well oriented, appropriate affect Breasts: Status post bilateral mastectomies. There Davis no evidence of chest wall recurrence. Both axillae are benign.  LAB RESULTS: Lab Results  Component  Value Date   WBC 9.5 01/07/2016   NEUTROABS 2.6 12/24/2015   HGB 9.0 (L) 01/08/2016   HCT 27.0 (L) 01/08/2016   MCV 87.5 01/07/2016   PLT 193 01/07/2016      Chemistry      Component Value Date/Time   NA 140 01/08/2016 0452   NA 140 12/24/2015 0955   K 3.7 01/08/2016 0452   K 5.0 12/24/2015 0955   CL 108 01/08/2016 0452   CL 106 09/18/2012 1444   CO2 28 01/08/2016 0452   CO2 29 12/24/2015 0955   BUN <5 (L) 01/08/2016 0452   BUN 13.0 12/24/2015 0955   CREATININE 0.53 01/08/2016 0452   CREATININE 0.8 12/24/2015 0955      Component Value Date/Time   CALCIUM 8.5 (L) 01/08/2016 0452   CALCIUM 9.8 12/24/2015 0955   ALKPHOS 58 12/24/2015 0955   AST 12 12/24/2015 0955   ALT 13 12/24/2015 0955   BILITOT 0.49 12/24/2015 0955       Lab Results  Component Value Date   LABCA2 9 09/18/2012     STUDIES: Complete CBC and see med obtained May 9 through the [facility] were entirely normal   ASSESSMENT: 66 y.o.  [place] woman   (1)  status post bilateral mastectomies October of 2012 for a left sided invasive ductal carcinoma, T1b N0 or Stage IA, grade 2, estrogen receptor 95% positive, progesterone receptor negative, with no HER-2 amplification and an MIB-1 of 26%;  (2) Oncotype DX score in the intermediate range, predicting a 19% risk of recurrence with 5 years of tamoxifen;   (3)  treated in the adjuvant  setting with docetaxel/cyclophosphamide x4 completed 10/03/2011;   (4)  She started letrozole early April 2013, switched to tamoxifen December 2013, stopped tamoxifen in April 2014; resuming tamoxifen at 10 mg daily August of 2014, stopped September 2014 and decided against further anti-estrogen therapy  (5) osteopenia, s/p Zometa 03/13/2012,  second dose 10/01/2014  (a) bone density February 2016 at [facility] showed a T score of - 2.0, but overall was improved  PLAN:  Beth Davis now 5-1/2 years out from definitive surgery for her breast cancer with no evidence of disease recurrence. This Davis very favorable.   It Davis also favorable that she really feels she Davis over the cancer problem. She tells me she does not worry about it, she doesn't identify with cancer patients, and she feels completely normal. That Davis an achievement itself  At this point I feel comfortable releasing her to her primary care physician. All she will need in terms of breast cancer follow-up Davis a yearly physician chest wall exam  I will be glad to see Beth Davis at any point in the future if on when the need arises but as of now are making no further routine appointment for her here.     Marland KitchenChauncey Cruel, MD    12/29/2016

## 2017-06-14 ENCOUNTER — Other Ambulatory Visit: Payer: Self-pay | Admitting: Nurse Practitioner

## 2017-08-22 IMAGING — CT CT ABD-PEL WO/W CM
2 of 10 series · 10 of 46 positions shown, 16 images · IV contrast (omnipaque)
Comparison: None.

CLINICAL DATA: Hydronephrosis, concern for ureteral injury post
pelvic exploration. Patient post pelvic exploration for chronic pain
1 week prior, with difficulty intraoperatively identifying the left
ureter. Flank pain developed postoperative day 3 or 4, ultrasound in
the office demonstrating left hydronephrosis. Severe left flank pain
with nausea.

EXAM:
CT ABDOMEN AND PELVIS WITHOUT AND WITH CONTRAST
TECHNIQUE: Multidetector CT imaging of the abdomen and pelvis was performed
following the standard protocol before and following the bolus
administration of intravenous contrast.
CONTRAST:  125mL OMNIPAQUE IOHEXOL 300 MG/ML  SOLN

[Series 8: post · axial · 0.67mm/px · z∈[+822,+1186]mm · 8 of 95 slices shown, 13 images]
[im 11/95  soft-tissue]
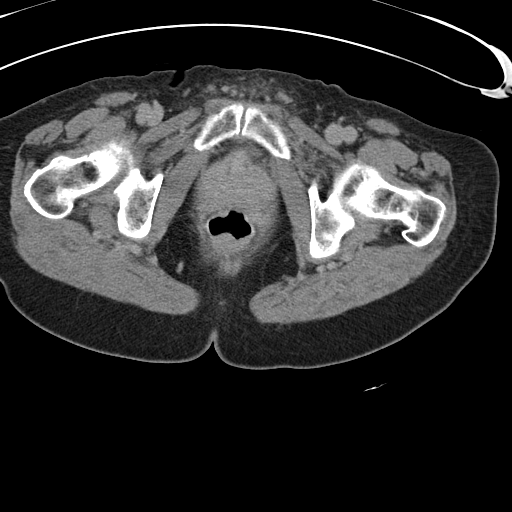
[im 11/95  bone]
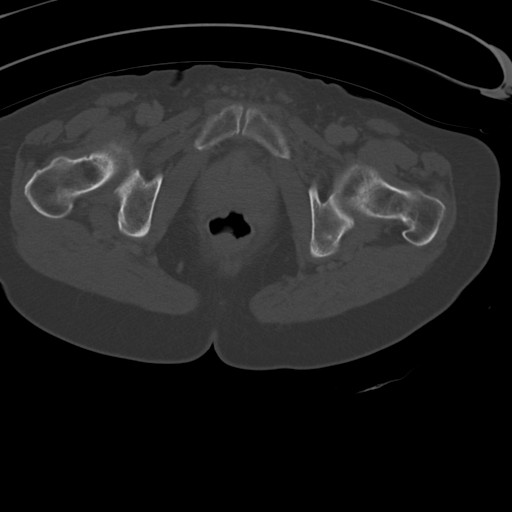
[im 21/95  soft-tissue]
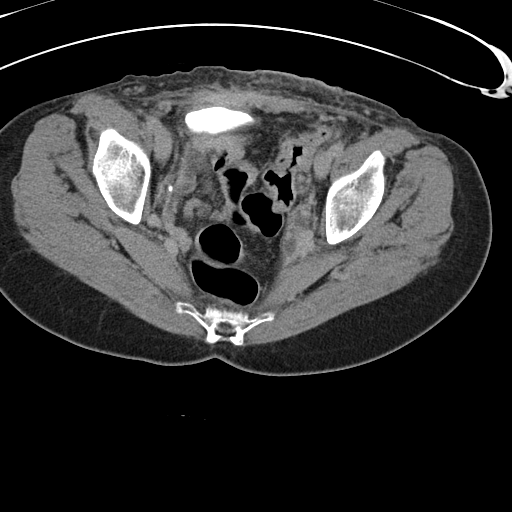
[im 32/95  soft-tissue]
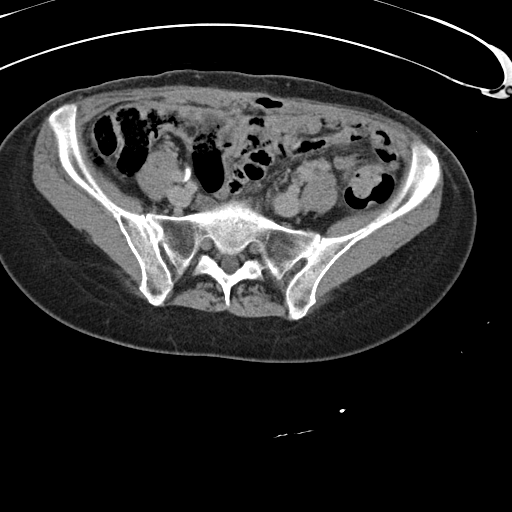
[im 42/95  soft-tissue]
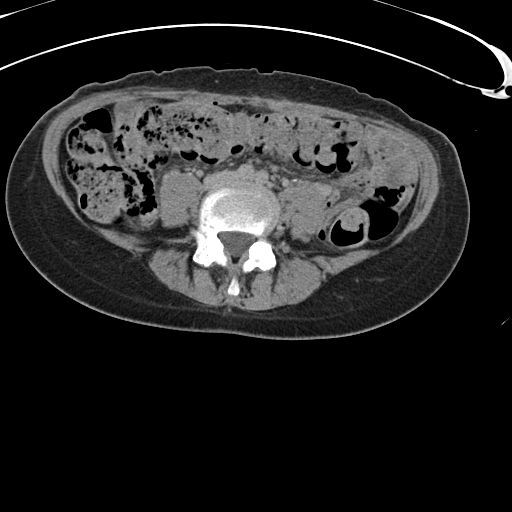
[im 53/95  soft-tissue]
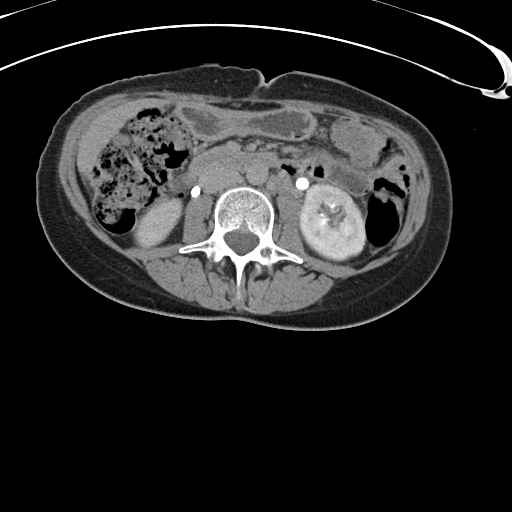
[im 53/95  lung]
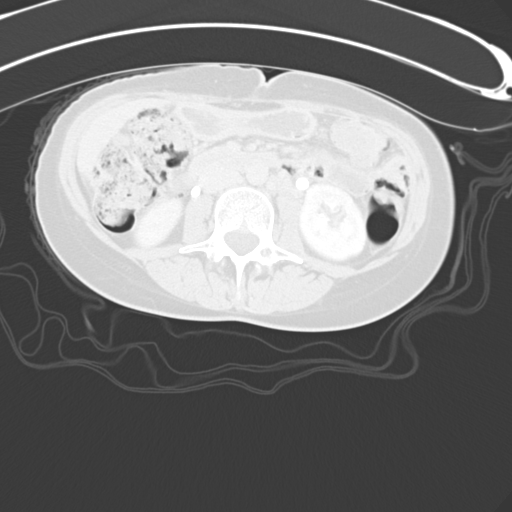
[im 63/95  soft-tissue]
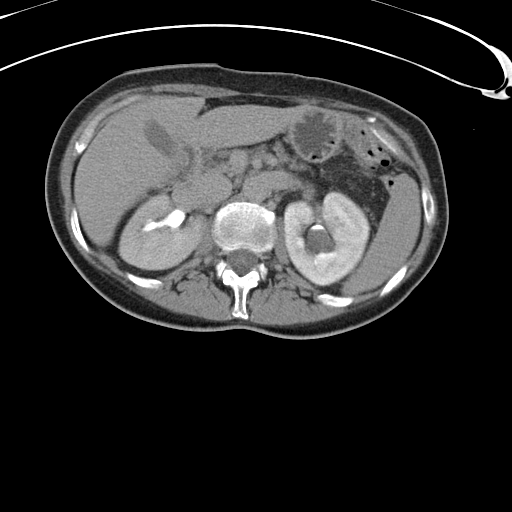
[im 63/95  lung]
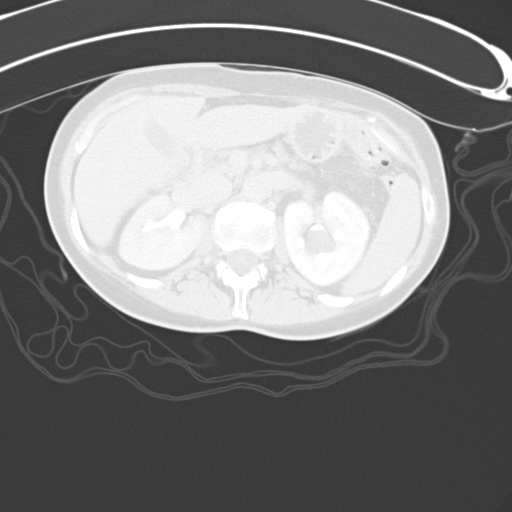
[im 74/95  soft-tissue]
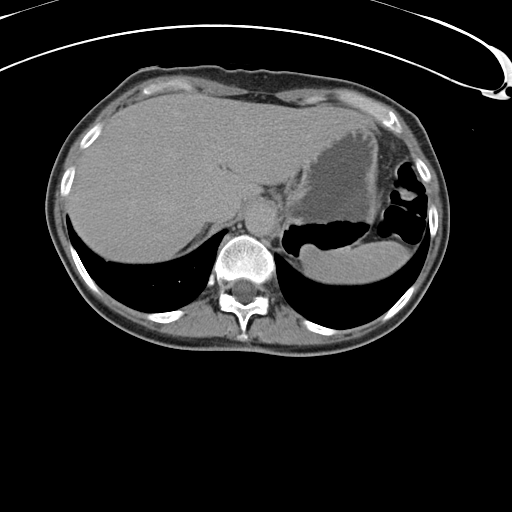
[im 74/95  lung]
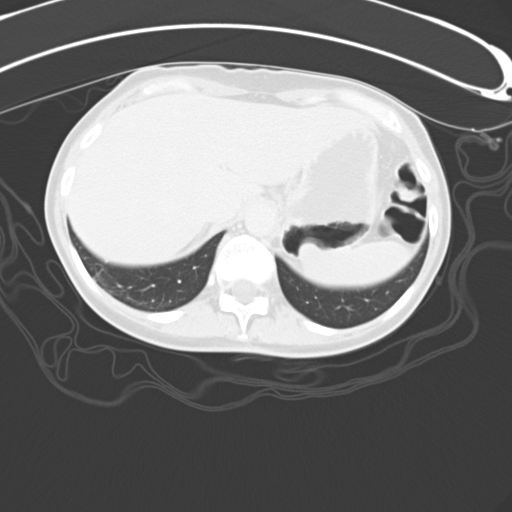
[im 84/95  soft-tissue]
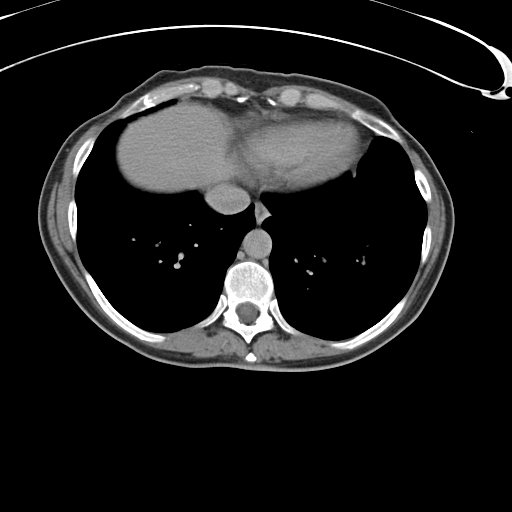
[im 84/95  lung]
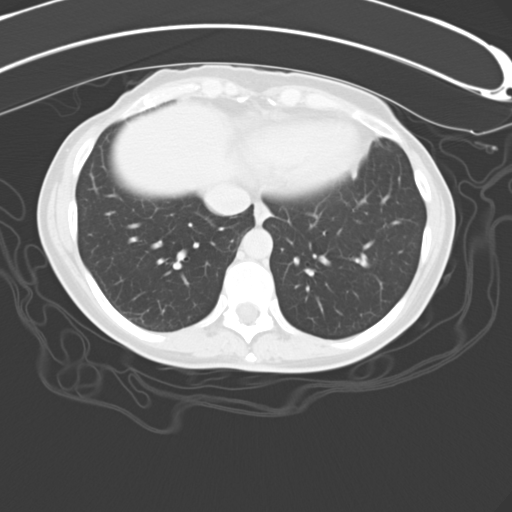

[Series 605: <mpr thick range(2)> · coronal · 0.87mm/px · 2 of 66 slices shown, 3 images]
[im 22/66  soft-tissue]
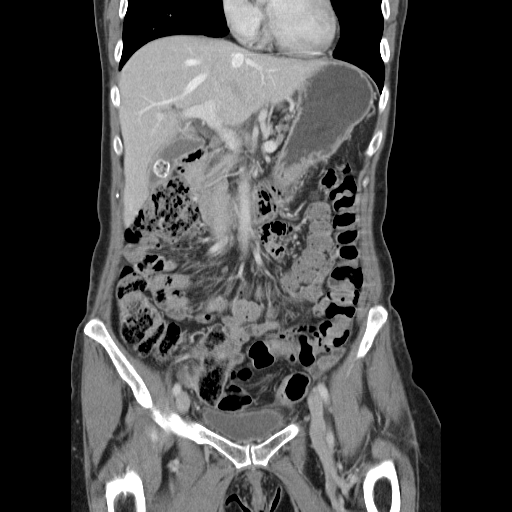
[im 22/66  bone]
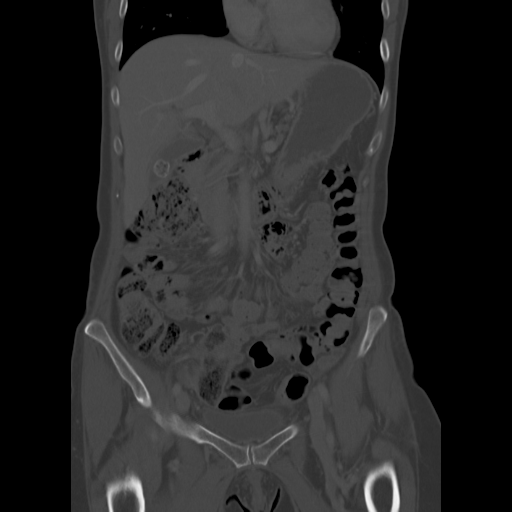
[im 44/66  soft-tissue]
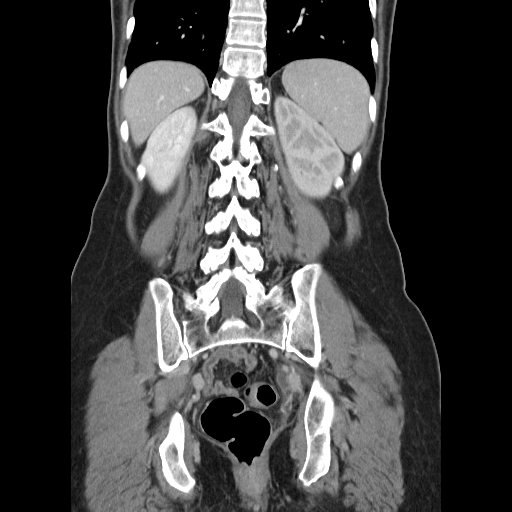

[10 of 46 positions shown; findings below may reference images not displayed]

FINDINGS: Lower chest: The included lung bases are clear. No pleural effusion.
Heart size is normal.

Liver: 7 mm hypodensity in the subcapsular right lobe with tiny
additional subcentimeter hepatic hypodensities, too small to
characterize but likely cysts or hamartomas. No suspicious hepatic
lesion.

Hepatobiliary: Calcified gallstone in the gallbladder, no biliary
inflammation. No biliary dilatation.

Pancreas: No ductal dilatation or inflammation.

Spleen: Normal.

Adrenal glands: No nodule.

Kidneys: Precontrast imaging demonstrates no urolithiasis. There is
left hydronephrosis. Portal venous phase imaging demonstrate delayed
enhancement of the left kidney. Excretory phase imaging demonstrates
excretion from the left kidney, with minimal prominence of the left
proximal ureter, however no gross ureteral dilatation. There are
lobular filling defects within the mid in the left renal collecting
system. Contrast opacifies the mid and proximal ureter which is not
significantly dilated. The distal most ureter is not well seen.
There is no perinephric or definite periureteric collection. Small
ovoid left pelvic sidewall fluid collection measures 2.3 x 0.9 cm,
does not opacify with contrast, however the ureteral opacification
does not extend to this level. Right ureter appears normal
throughout its course. No filling defects in the right renal
collecting system.

Stomach/Bowel: Stomach physiologically distended. There are no
dilated or thickened small bowel loops. Moderate volume of stool
throughout the colon without colonic wall thickening. The appendix
is not confidently identified.

Vascular/Lymphatic: No retroperitoneal adenopathy. Abdominal aorta
is normal in caliber. Left ovarian venous collaterals.

Reproductive: Post hysterectomy and bilateral salpingo oophorectomy.
Soft tissue stranding in the pelvis post recent surgery. Small left
pelvic sidewall fluid collection as described. Small foci of
extraluminal air, not unexpected 1 week post surgery. No evidence of
pelvic abscess.

Bladder: Minimally distended. Equivocal wall thickening involving
the posterior left bladder in the region of the urinary trigone.

Other: Scattered air in the anterior abdominal wall musculature and
subcutaneous tissues, can see stent with recent surgery. Associated
soft tissue stranding at the anterior abdominal wall surgical sites.

Musculoskeletal: There are no acute or suspicious osseous
abnormalities.
IMPRESSION: 1. Left hydronephrosis. Left ureter is not dilated. No evidence of
large urinoma. There is a small left pelvic sidewall fluid
collection that is not opacified with contrast on delayed phase
imaging that is nonspecific. This may simply represent postsurgical
change. A small urinoma is not entirely excluded, but felt less
likely. Lobular hypodensity within the left renal collecting system,
more lobular in shape than to be expected for layering urine and
contrast. Blood clot versus underlying lesions such as transitional
cell carcinoma are considered.
2. No evidence pelvic abscess.

These results were called by telephone at the time of interpretation
on 10/06/2015 at [DATE] to Dr. LORENIA CLARKE , who verbally
acknowledged these results.

## 2017-09-07 IMAGING — XA IR EXCHANGE NEPHROSTOMY LEFT
1 series · 9 of 9 positions shown · non-contrast
Comparison: 10/06/2015

INDICATION: Left hydronephrosis post bilateral salpingo oophorectomy.

EXAM:
IR EXCHANGE NEPHROSTOMY LEFT
LEFT URETERAL STENT PLACEMENT (ATTEMPTED)

[Series 300: ir nephrostogram left initial placement · 9 of 9 slices shown]
[im 1/9]
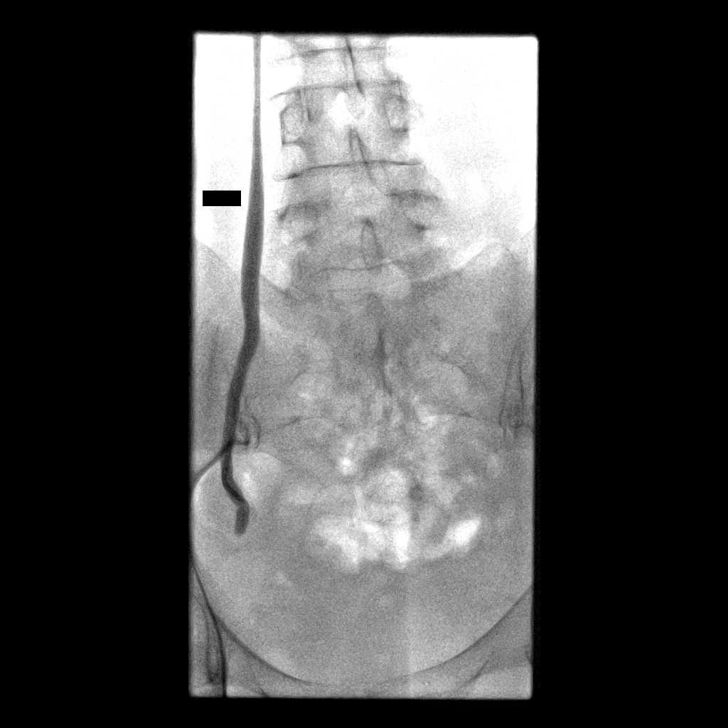
[im 2/9]
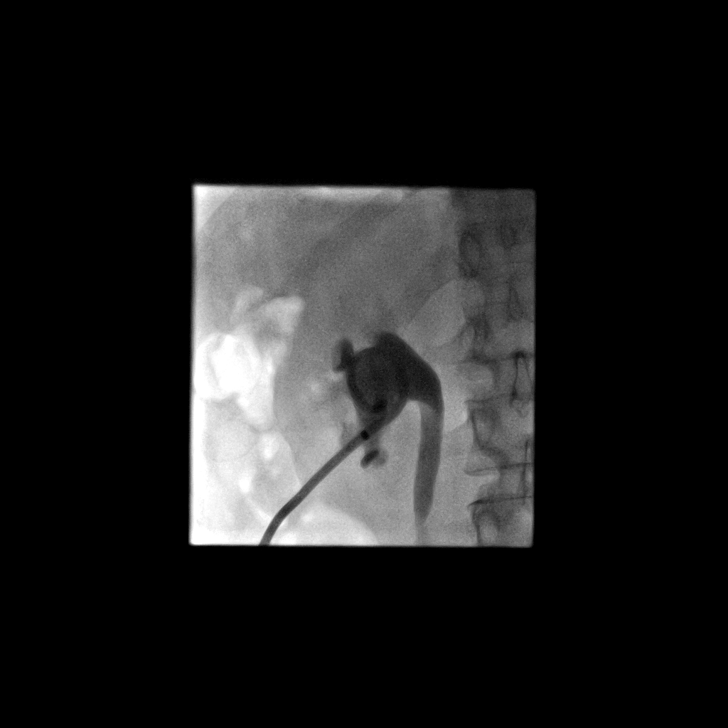
[im 3/9]
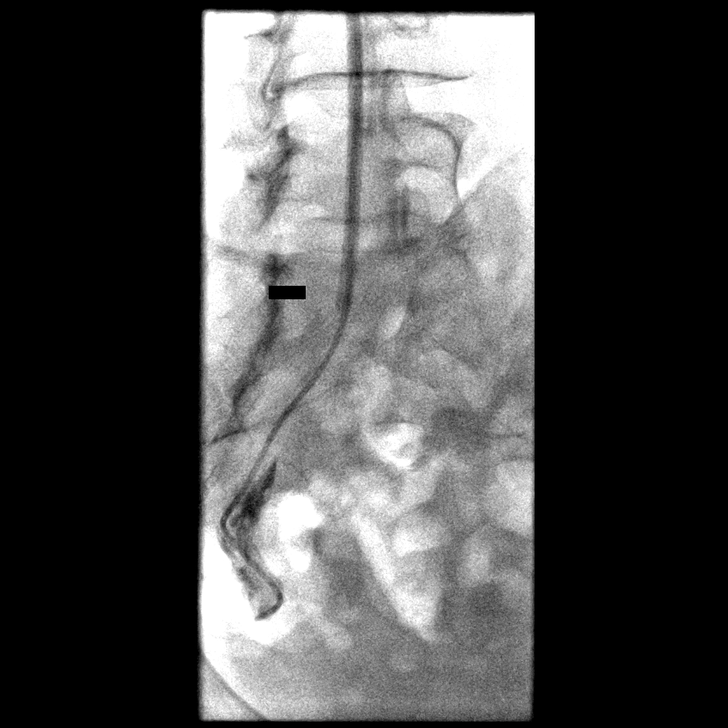
[im 4/9]
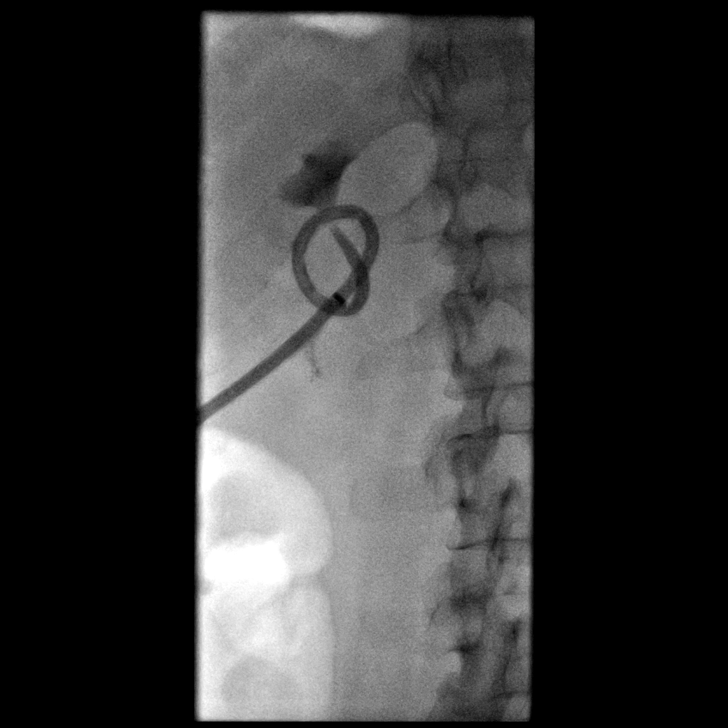
[im 5/9]
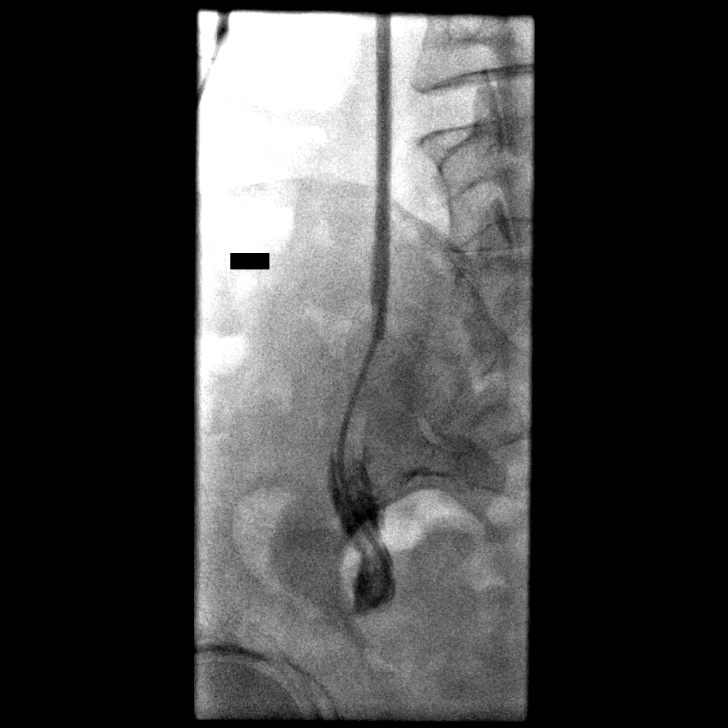
[im 6/9]
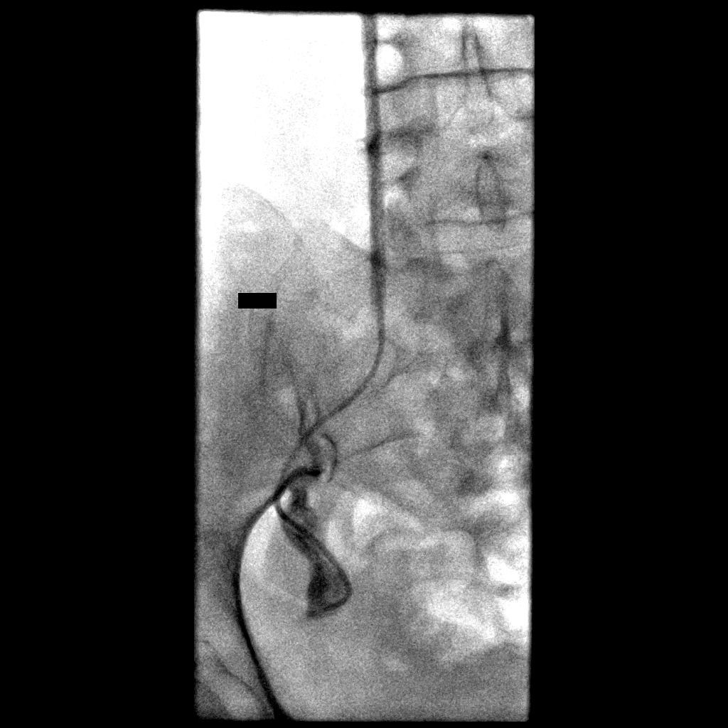
[im 7/9]
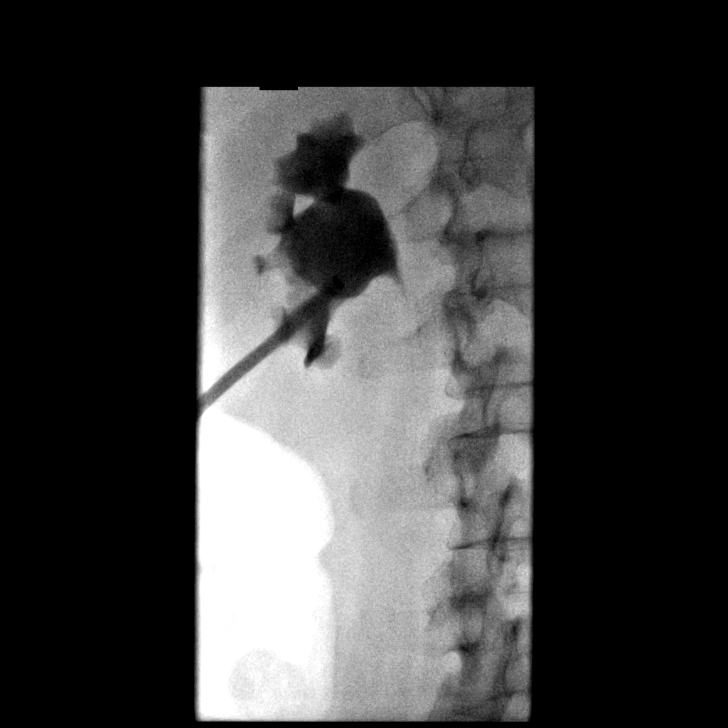
[im 8/9]
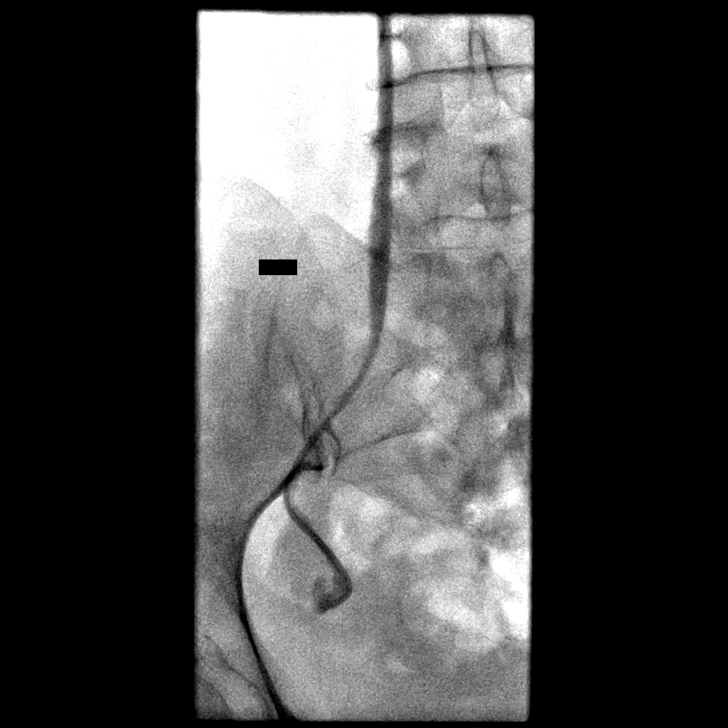
[im 9/9]
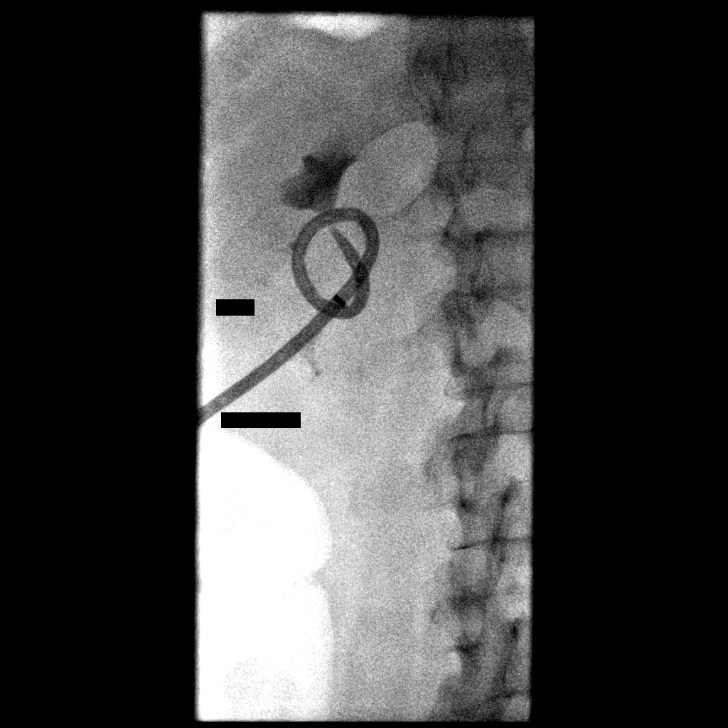

[9 of 9 positions shown; findings below may reference images not displayed]

MEDICATIONS:
Vancomycin 1 gm ; The antibiotic was administered in an appropriate
time frame prior to skin puncture.

ANESTHESIA/SEDATION:
Intravenous Fentanyl and Versed were administered as conscious
sedation during continuous monitoring of the patient's level of
consciousness and physiological / cardiorespiratory status by the
radiology RN, with a total moderate sedation time of 26 minutes.

FLUOROSCOPY TIME:  11.5 minutes, 423 uUym0 DAP

COMPLICATIONS:
None immediate.

PROCEDURE:
Informed written consent was obtained from the patient after a
thorough discussion of the procedural risks, benefits and
alternatives. All questions were addressed. Maximal Sterile Barrier
Technique was utilized including caps, mask, sterile gowns, sterile
gloves, sterile drape, hand hygiene and skin antiseptic. A timeout
was performed prior to the initiation of the procedure.

Previously placed left nephrostomy catheter and surrounding skin
prepped and draped in usual sterile fashion. Maximal barrier sterile
technique was utilized including caps, mask, sterile gowns, sterile
gloves, sterile drape, hand hygiene and skin antiseptic. Contrast
was injected through the nephrostomy catheter to opacify the left
renal collecting system. The catheter was cut and exchanged over a
Bentson wire for a 5 French Kumpe catheter, advanced into the distal
ureter. Injection demonstrates obstruction below the level of the SI
joint. An angled Glidewire would not pass beyond the obstruction.
The catheter was exchanged for a 9 French peel-away sheath to
provide additional support. The lesion could not be traversed. The
Kumpe was exchanged for an angled 4 French glide catheter, which
allowed some progression beyond the initially encountered
obstruction over an additional 1-1.5 cm, but ultimately access to
the distal ureter and urinary bladder could not be achieved, despite
using a variety of catheter and guidewire techniques. Ultimately a
false passage into the retroperitoneum was created by the guidewire,
confirmed with a small contrast injection. The peel-away sheath and
angiographic catheter were exchanged for a new 10 French pigtail
catheter, placed as nephrostomy tube. Catheter secured externally
with 0 Prolene suture and StatLock and placed to external drain bag.
The patient tolerated the procedure well.
IMPRESSION: 1. High-grade distal left ureteral obstruction could not be
traversed with a variety of catheter/guidewire techniques ; ureteral
stent was therefore not placed.
2. Technically successful exchange of left nephrostomy catheter,
left to external drainage.

## 2019-09-07 ENCOUNTER — Other Ambulatory Visit: Payer: Self-pay | Admitting: Nurse Practitioner

## 2019-09-13 ENCOUNTER — Ambulatory Visit: Payer: BLUE CROSS/BLUE SHIELD

## 2019-10-04 ENCOUNTER — Ambulatory Visit: Payer: BLUE CROSS/BLUE SHIELD

## 2020-10-05 ENCOUNTER — Ambulatory Visit: Payer: BLUE CROSS/BLUE SHIELD | Admitting: Podiatry

## 2022-07-25 ENCOUNTER — Ambulatory Visit (INDEPENDENT_AMBULATORY_CARE_PROVIDER_SITE_OTHER): Payer: Medicare Other | Admitting: Family Medicine

## 2022-07-25 VITALS — BP 124/68 | Ht 62.0 in | Wt 110.0 lb

## 2022-07-25 DIAGNOSIS — M25561 Pain in right knee: Secondary | ICD-10-CM | POA: Diagnosis present

## 2022-07-25 NOTE — Patient Instructions (Signed)
You have patellar tendinitis which has mostly resolved at this point. Focus on the strengthening of your inside quad muscle now. We have also shown you the exercises for this tendon if this worsens again. Icing as needed 15 minutes at a time. Tylenol as needed for pain. Follow up with [place] as needed.

## 2022-07-25 NOTE — Progress Notes (Cosign Needed Addendum)
  SUBJECTIVE:   Chief Complaint: right knee pain  History of Presenting Illness:   Ms Guier is a 71 year old female with a past medical history of osteopenia who presents with right knee pain.  She states that the pain started two weeks ago on 11-27 after walking on a steep, uneven, rugged trail. The next day, she noticed knee pain that she describes as dull and achy. Pain is concentrated in two locations: the inner aspect of her knee medial to the patella and directly below the patella. It is exacerbated with movements such as bending the knee, walking, and traversing stairs particularly ascending. Today, the pain has improved significantly compared to last week and has almost completely subsided.  Pertinent Medical History:   osteopenia  Past Medical History:  Diagnosis Date   Arthritis    Breast cancer, IDC, Left, ER +, PR -, Her2 -. dx 06/01/2011--- oncologist-  dr Jana Hakim-- per last note no recurrence   Stage IA (T1b, N0), grade 2---- s/p  left total mastectomy w/ node disssection & right prophylactic total mastectomy 06-14-2011  and chemotherapy complete 10-03-2011 (anti-estrogen therapry ended 09/ 2014)   Family history of breast cancer    Family history of melanoma    History of colitis    COLLAGENOUS   Hypothyroidism    Migraine    Osteoporosis    Pelvic adhesions    Pelvic pain in female    PONV (postoperative nausea and vomiting)    Wears glasses       OBJECTIVE:   BP 124/68   Ht _0  (1.575 m)   Wt 110 lb (49.9 kg)   BMI 20.12 kg/m   Physical Examination:  Right knee Mild diffuse swelling compared to left, no erythema or new lesions No tenderness to palpation. RoM full-symmetric with flexion 150 and extension 0, no varus or valgus instability  Sensation to light touch intact across L4-S1 Strength 5/5 with flexion, extension Lachman test negative, anterior drawer test negative, McMurray test negative, Thessaly test negative   ASSESSMENT/PLAN:    Patient's presentation is most consistent with patellar tendinitis, which has almost completely resolved.  > Strengthening exercises targeting the quadriceps, especially VMO. > Cryotherapy as needed 09MMH per application > Acetaminophen as needed for pain > Return to [facility] if symptoms persist    There are no diagnoses linked to this encounter.  No follow-ups on file.  Roswell Nickel, MD  07/25/2022, 10:21 AM

## 2022-07-26 ENCOUNTER — Encounter: Payer: Self-pay | Admitting: Family Medicine

## 2024-04-22 ENCOUNTER — Ambulatory Visit (INDEPENDENT_AMBULATORY_CARE_PROVIDER_SITE_OTHER): Admitting: Family Medicine

## 2024-04-22 ENCOUNTER — Other Ambulatory Visit: Payer: Self-pay

## 2024-04-22 VITALS — BP 124/72 | Ht 62.0 in | Wt 105.0 lb

## 2024-04-22 DIAGNOSIS — M25561 Pain in right knee: Secondary | ICD-10-CM | POA: Diagnosis present

## 2024-04-22 NOTE — Patient Instructions (Signed)
 You have patellar tendinitis. Ice area 15 minutes at a time as needed. Advil up to 3 times a day with food or voltaren gel up to 4 times a day - consider using for 7 days then as needed. Do home exercises daily as directed for next 4-6 weeks. Avoid deep squats, deep lunges, leg press, hills when possible. Follow up with me in 1 month.

## 2024-04-22 NOTE — Progress Notes (Unsigned)
 PCP: Lenon Boyer, FNP  Subjective:   HPI: Patient is a 73 y.o. female here for right knee pain.  New aerobics low impact, started 12 days ago. Had a lot stepping up and down. Some jumping as well. Prior to this just brisk walking.  Had no issues with riding a bike. Pain over distal aspect of patella tendon. Also under the knee pain. Slowly started hurting over the past 2 weeks. Took some advil for a headache which helped. 3 years pain was on medial aspect of knee. Restarted her knee pain. Injury 3 years ago after lifting weights with trainer.   Past Medical History:  Diagnosis Date   Arthritis    Breast cancer, IDC, Left, ER +, PR -, Her2 -. dx 06/01/2011--- oncologist-  dr layla-- per last note no recurrence   Stage IA (T1b, N0), grade 2---- s/p  left total mastectomy w/ node disssection & right prophylactic total mastectomy 06-14-2011  and chemotherapy complete 10-03-2011 (anti-estrogen therapry ended 09/ 2014)   Family history of breast cancer    Family history of melanoma    History of colitis    COLLAGENOUS   Hypothyroidism    Migraine    Osteoporosis    Pelvic adhesions    Pelvic pain in female    PONV (postoperative nausea and vomiting)    Wears glasses     Current Outpatient Medications on File Prior to Visit  Medication Sig Dispense Refill   cholecalciferol  (VITAMIN D ) 1000 UNITS tablet Take 1,000 Units by mouth daily.       frovatriptan (FROVA) 2.5 MG tablet Take 2.5 mg by mouth as needed for migraine. If recurs, may repeat after 2 hours. Max of 3 tabs in 24 hours.     gabapentin  (NEURONTIN ) 300 MG capsule TAKE 1 CAPSULE (300 MG TOTAL) BY MOUTH AT BEDTIME. (Patient taking differently: TAKE 1 CAPSULE (300 MG TOTAL) BY MOUTH AT BEDTIME AS NEEDED FOR PAIN.) 90 capsule 4   levothyroxine  (SYNTHROID , LEVOTHROID) 75 MCG tablet Take 75 mcg by mouth daily before breakfast.     liothyronine  (CYTOMEL ) 5 MCG tablet Take 5 mcg by mouth daily.     senna-docusate  (SENOKOT-S) 8.6-50 MG tablet Take 1 tablet by mouth daily. While taking narcotics. HOld for diarrhea. 60 tablet 2   sulfamethoxazole -trimethoprim  (BACTRIM  DS,SEPTRA  DS) 800-160 MG tablet Take 1 tablet by mouth 2 (two) times daily. Start 1 day prior to your post-op appointment and foley catheter removal 6 tablet 0   traMADol  (ULTRAM ) 50 MG tablet Take 1-2 tablets (50-100 mg total) by mouth every 6 (six) hours as needed. 31 tablet 0   [DISCONTINUED] promethazine  (PHENERGAN ) 25 MG tablet      No current facility-administered medications on file prior to visit.    Past Surgical History:  Procedure Laterality Date   ABDOMINAL HYSTERECTOMY  2006   BREAST SURGERY  2004   excision left breast fibroadenoma   CARPAL TUNNEL RELEASE Right 02/05/2013   Procedure: CARPAL TUNNEL RELEASE;  Surgeon: Lamar LULLA Leonor Mickey., MD;  Location: [facility];  Service: Orthopedics;  Laterality: Right;   COLONOSCOPY  2012   CYSTOSCOPY Bilateral 09/28/2015   Procedure: CYSTOSCOPY;  Surgeon: Norleen Skill, MD;  Location: [facility];  Service: Gynecology;  Laterality: Bilateral;   CYSTOSCOPY W/ URETERAL STENT PLACEMENT Bilateral 10/06/2015   Procedure: CYSTOSCOPY WITH BILATERAL  RETROGRADE PYELOGRAM / LEFT SEMI-RIGID URETEROSCOPY/ LEFT URETERAL EXPLORATION;  Surgeon: Morene LELON Salines, MD;  Location: [facility];  Service: Urology;  Laterality: Bilateral;   LAPAROSCOPIC BILATERAL SALPINGO OOPHERECTOMY Bilateral 09/28/2015   Procedure:  ATTEMPTED LAPAROSCOPIC CONVERTED TO OPEN LAPAROSCOPY WITH BILATERAL SALPINGO OOPHORECTOMY AND  CERVICAL STUMP;  Surgeon: Norleen Skill, MD;  Location: [facility];  Service: Gynecology;  Laterality: Bilateral;   LAPAROSCOPIC OVARIAN CYSTECTOMY  2003   LEFT TOTAL MASTECTOMY WITH LYMPH NODE DISSECTION/  RIGHT PROPHYLACTIC MASTECTOMY  06-14-2011   MASS EXCISION  03/12/2012   Procedure: MINOR EXCISION OF MASS;  Surgeon: Sherlean JINNY Laughter, MD;  Location: [facility];  Service: General;  Laterality: Right;  removal part of port-a-cath scar   NEGATIVE SLEEP STUDY  2016  per pt   ORIF FEMUR FRACTURE  1969   and Humerus decompression   PORT-A-CATH REMOVAL  10/24/2011   Procedure: MINOR REMOVAL PORT-A-CATH;  Surgeon: Sherlean JINNY Laughter, MD;  Location: [facility];  Service: General;  Laterality: Right;  removal of portacath   PORTACATH PLACEMENT  07/26/2011   Procedure: INSERTION PORT-A-CATH;  Surgeon: Sherlean JINNY Laughter, MD;  Location: [facility];  Service: General;  Laterality: N/A;  port a cath placement   URETERAL REIMPLANTION Left 01/07/2016   Procedure: OPEN LEFT URETERAL REIMPLANT ;  Surgeon: Morene LELON Salines, MD;  Location: [facility];  Service: Urology;  Laterality: Left;    Allergies  Allergen Reactions   Ciprofloxacin  Other (See Comments)    Redness at IV site - Red Man Syndrome - IV may have been run in to fast causing this reaction   Dilaudid  [Hydromorphone  Hcl] Nausea And Vomiting   Erythromycin Nausea And Vomiting   Penicillins Hives    Has patient had a PCN reaction causing immediate rash, facial/tongue/throat swelling, SOB or lightheadedness with hypotension: NO Has patient had a PCN reaction causing severe rash involving mucus membranes or skin necrosis: NO Has patient had a PCN reaction that required hospitalization NO Has patient had a PCN reaction occurring within the last 10 years: NO If all of the above answers are NO, then may proceed with Cephalosporin use.   Reglan  [Metoclopramide  Hcl] Diarrhea   Vancomycin  Other (See Comments)    Redness/itching at IV site - Red Man Syndrome - IV may have been run in to fast causing this reaction     BP 124/72   Ht 5' 2 (1.575 m)   Wt 105 lb (47.6 kg)   BMI 19.20 kg/m       No data to display              No data to display              Objective:  Physical Exam:  Gen: NAD, comfortable in exam room  Right knee: Inspection:  No obvious swelling or deformity Palpation: Mild tenderness to palpation over the mid to distal patella tendon, no tenderness palpation along the medial and lateral joint lines, patella or quadriceps tendon Range of motion: Full range of motion knee flexion and extension, resisted flexion does not reproduce pain Strength: 5/5 knee flexion extension Special tests: Negative patellar grind, negative valgus and varus stress test at 0 and 30 degrees knee flexion, negative Lachman, negative McMurray  Limited Knee US : Suprapatellar pouch and quadricep tendon visualized in short and long axis with no obvious hypoechoic changes.  Patella tendon visualized in short and long axis with mild peritendinous hypoechoic changes at the mid to distal aspect.  Normal medial and lateral meniscus.  Impression: Mild patellar tendinopathy.  Assessment & Plan:  Assessment & Plan Acute pain of right knee Likely repeat patellar tendinitis in the setting of acute increase in activity with jumping motions.  Consistent with ultrasound findings.  May have some component of patellofemoral OA but seems less likely given clinical history. - Ice 15 minutes 3-4 times a day as needed - May take ibuprofen up to 3 times a day as needed for pain - May also use Voltaren gel up to 4 times a day, recommended 7-day regular use prior to as needed - Provided with home exercises to strengthen quadriceps and hamstring muscles - Avoid deep squats, deep lunges, leg press, hills when possible - Follow-up 1 month  Ozell Provencal, MD, PGY-3 [facility] 12:32 PM 04/22/2024

## 2024-05-20 ENCOUNTER — Ambulatory Visit: Admitting: Family Medicine
